# Patient Record
Sex: Male | Born: 1935
Health system: Southern US, Community
[De-identification: ages and names within clinical notes are randomized; demographics above are authoritative.]

## PROBLEM LIST (undated history)

## (undated) DIAGNOSIS — M545 Low back pain, unspecified: Secondary | ICD-10-CM

## (undated) DIAGNOSIS — G4733 Obstructive sleep apnea (adult) (pediatric): Secondary | ICD-10-CM

## (undated) DIAGNOSIS — I639 Cerebral infarction, unspecified: Secondary | ICD-10-CM

## (undated) DIAGNOSIS — E785 Hyperlipidemia, unspecified: Secondary | ICD-10-CM

## (undated) DIAGNOSIS — Z9989 Dependence on other enabling machines and devices: Secondary | ICD-10-CM

## (undated) DIAGNOSIS — H409 Unspecified glaucoma: Secondary | ICD-10-CM

## (undated) DIAGNOSIS — N400 Enlarged prostate without lower urinary tract symptoms: Secondary | ICD-10-CM

## (undated) DIAGNOSIS — N189 Chronic kidney disease, unspecified: Secondary | ICD-10-CM

## (undated) DIAGNOSIS — Z950 Presence of cardiac pacemaker: Secondary | ICD-10-CM

## (undated) DIAGNOSIS — I10 Essential (primary) hypertension: Secondary | ICD-10-CM

## (undated) DIAGNOSIS — M1990 Unspecified osteoarthritis, unspecified site: Secondary | ICD-10-CM

## (undated) DIAGNOSIS — M069 Rheumatoid arthritis, unspecified: Secondary | ICD-10-CM

## (undated) DIAGNOSIS — D519 Vitamin B12 deficiency anemia, unspecified: Secondary | ICD-10-CM

## (undated) DIAGNOSIS — Z7901 Long term (current) use of anticoagulants: Secondary | ICD-10-CM

## (undated) DIAGNOSIS — I499 Cardiac arrhythmia, unspecified: Secondary | ICD-10-CM

## (undated) DIAGNOSIS — I4891 Unspecified atrial fibrillation: Secondary | ICD-10-CM

## (undated) DIAGNOSIS — R7303 Prediabetes: Secondary | ICD-10-CM

## (undated) DIAGNOSIS — Z8719 Personal history of other diseases of the digestive system: Secondary | ICD-10-CM

## (undated) DIAGNOSIS — Z9289 Personal history of other medical treatment: Secondary | ICD-10-CM

## (undated) DIAGNOSIS — C801 Malignant (primary) neoplasm, unspecified: Secondary | ICD-10-CM

## (undated) DIAGNOSIS — G8929 Other chronic pain: Secondary | ICD-10-CM

## (undated) DIAGNOSIS — Z8739 Personal history of other diseases of the musculoskeletal system and connective tissue: Secondary | ICD-10-CM

## (undated) DIAGNOSIS — R7302 Impaired glucose tolerance (oral): Secondary | ICD-10-CM

## (undated) HISTORY — PX: CATARACT EXTRACTION W/ INTRAOCULAR LENS  IMPLANT, BILATERAL: SHX1307

## (undated) HISTORY — DX: Rheumatoid arthritis, unspecified: M06.9

## (undated) HISTORY — DX: Hyperlipidemia, unspecified: E78.5

## (undated) HISTORY — DX: Essential (primary) hypertension: I10

## (undated) HISTORY — DX: Impaired glucose tolerance (oral): R73.02

## (undated) HISTORY — PX: CHOLECYSTECTOMY OPEN: SUR202

## (undated) HISTORY — DX: Benign prostatic hyperplasia without lower urinary tract symptoms: N40.0

## (undated) HISTORY — DX: Unspecified atrial fibrillation: I48.91

## (undated) HISTORY — DX: Cerebral infarction, unspecified: I63.9

## (undated) HISTORY — DX: Long term (current) use of anticoagulants: Z79.01

## (undated) HISTORY — PX: COLONOSCOPY: SHX174

---

## 2001-03-16 ENCOUNTER — Ambulatory Visit (HOSPITAL_BASED_OUTPATIENT_CLINIC_OR_DEPARTMENT_OTHER): Admission: RE | Admit: 2001-03-16 | Discharge: 2001-03-16 | Payer: Self-pay

## 2001-04-01 ENCOUNTER — Ambulatory Visit (HOSPITAL_COMMUNITY): Admission: RE | Admit: 2001-04-01 | Discharge: 2001-04-01 | Payer: Self-pay | Admitting: Gastroenterology

## 2001-04-15 ENCOUNTER — Ambulatory Visit (HOSPITAL_BASED_OUTPATIENT_CLINIC_OR_DEPARTMENT_OTHER): Admission: RE | Admit: 2001-04-15 | Discharge: 2001-04-15 | Payer: Self-pay

## 2005-02-25 HISTORY — PX: US ECHOCARDIOGRAPHY: HXRAD669

## 2005-03-12 ENCOUNTER — Encounter: Admission: RE | Admit: 2005-03-12 | Discharge: 2005-06-10 | Payer: Self-pay | Admitting: Family Medicine

## 2005-04-11 ENCOUNTER — Ambulatory Visit (HOSPITAL_COMMUNITY): Admission: RE | Admit: 2005-04-11 | Discharge: 2005-04-11 | Payer: Self-pay | Admitting: Cardiovascular Disease

## 2007-05-11 ENCOUNTER — Encounter: Admission: RE | Admit: 2007-05-11 | Discharge: 2007-05-11 | Payer: Self-pay | Admitting: Cardiovascular Disease

## 2010-07-26 ENCOUNTER — Ambulatory Visit: Payer: Self-pay | Admitting: Cardiovascular Disease

## 2011-01-17 NOTE — Procedures (Signed)
[place]  Patient:    Mario Fisher, Mario Fisher                        MRN: [id number] Proc. Date: 04/01/01 Attending:  Petra Kuba, M.D. CC:         Jamesetta Geralds, M.D.   Procedure Report  PROCEDURE:  Colonoscopy.  INDICATION:  Change of bowel habits, worsening constipation, some bright red blood per rectum.   Consent was signed after risks, benefits, methods, and options thoroughly discussed in the office.  MEDICATIONS:  Demerol 50 mg, Versed 5 mg.  DESCRIPTION OF PROCEDURE:  Rectal inspection was pertinent for small external hemorrhoids.  Digital exam was negative.  The video colonoscope was inserted, easily advanced around the colon to the cecum.  This did not require any abdominal pressure or any position changes.  The cecum was identified by the appendiceal orifice and the ileocecal valve.  No obvious abnormality was seen on insertion.  The scope was slowly withdrawn.  The prep was adequate.  There was some liquid stool that required washing and suctioning.  On slow withdrawal through the colon, other than an occasional left-sided diverticulum, no other abnormalities were seen.  Once back in the rectum the scope was retroflexed, pertinent for some internal hemorrhoids.  The scope was straightened and readvanced a short way up the sigmoid, air was suctioned, and scope removed.  The patient tolerated the procedure well.  There was no obvious immediate complication.  ENDOSCOPIC DIAGNOSES: 1. Internal-external hemorrhoid. 2. Occasional left-sided diverticula. 3. Otherwise within normal limits to the cecum.  PLAN:  Yearly rectals and guaiacs per Dr. Tery Sanfilippo.  Happy to see back sooner p.r.n. or in two to three months to recheck his symptoms, guaiacs, and make sure no further workup plans are needed. DD:  04/01/01 TD:  04/01/01 Job: [id number]

## 2011-04-07 ENCOUNTER — Encounter: Payer: Self-pay | Admitting: Cardiovascular Disease

## 2011-06-02 ENCOUNTER — Ambulatory Visit: Payer: Self-pay | Admitting: Cardiovascular Disease

## 2011-06-05 ENCOUNTER — Ambulatory Visit (INDEPENDENT_AMBULATORY_CARE_PROVIDER_SITE_OTHER): Payer: Self-pay | Admitting: Cardiovascular Disease

## 2011-06-05 ENCOUNTER — Encounter: Payer: Self-pay | Admitting: Cardiovascular Disease

## 2011-06-05 VITALS — BP 133/81 | HR 70 | Ht 66.0 in | Wt 177.4 lb

## 2011-06-05 DIAGNOSIS — I4891 Unspecified atrial fibrillation: Secondary | ICD-10-CM

## 2011-06-05 DIAGNOSIS — I10 Essential (primary) hypertension: Secondary | ICD-10-CM

## 2011-06-05 NOTE — Progress Notes (Signed)
Mario Fisher Date of Birth  05/08/1934 [facility] [address]    [address] [place], [place]  [address] [phone]  Fax  [phone]  History of Present Illness:  77 year old gentleman with a history of hypertension and hyperlipidemia. He has intermittent atrial fibrillation.   He complains of back pain and knee pain.  Current Outpatient Prescriptions  Medication Sig Dispense Refill  . allopurinol (ZYLOPRIM) 100 MG tablet Take 100 mg by mouth 2 (two) times daily.        Marland Kitchen amLODipine (NORVASC) 2.5 MG tablet Take 2.5 mg by mouth daily.        . Cetirizine HCl (ZYRTEC PO) Take by mouth as needed.        . Cyanocobalamin (VITAMIN B-12 IJ) Inject as directed every 30 (thirty) days.        . dabigatran (PRADAXA) 150 MG CAPS Take 150 mg by mouth 2 (two) times daily.        . fish oil-omega-3 fatty acids 1000 MG capsule Take 1,200 mg by mouth 2 (two) times daily.       . Glucosamine-Chondroit-Vit C-Mn (GLUCOSAMINE CHONDR 1500 COMPLX PO) Take 1,500 mg by mouth.        . Multiple Vitamin (MULTIVITAMIN) tablet Take 1 tablet by mouth daily.        . Red Yeast Rice Extract (RED YEAST RICE PO) Take 600 mg by mouth 3 (three) times daily.       . Tamsulosin HCl (FLOMAX) 0.4 MG CAPS Take 0.4 mg by mouth daily.        . traMADol (ULTRAM) 50 MG tablet Take 50 mg by mouth every 6 (six) hours as needed.        . valsartan-hydrochlorothiazide (DIOVAN-HCT) 80-12.5 MG per tablet Take 1 tablet by mouth daily.        . naproxen sodium (ANAPROX) 220 MG tablet Take 220 mg by mouth as needed.            Allergies  Allergen Reactions  . Hctz (Hydrochlorothiazide)     Past Medical History  Diagnosis Date  . Atrial fibrillation   . Hypertension   . BPH (benign prostatic hypertrophy)   . Hyperlipidemia     Past Surgical History  Procedure Date  . Cholecystectomy   . US echocardiography 02/25/2005    ef 55-60%    History  Smoking status  . Former Smoker  . Quit date: 09/01/1968    Smokeless tobacco  . Not on file    History  Alcohol Use No    Family History  Problem Relation Age of Onset  . Arrhythmia Mother   . Heart attack Father     x2  . Hypertension Father   . Stroke Father   . Heart disease Sister   . Hypertension Brother     Reviw of Systems:  Reviewed in the HPI.  All other systems are negative.  Physical Exam: BP 133/81  Pulse 70  Ht 5\' 6"  (1.676 m)  Wt 177 lb 6.4 oz (80.468 kg)  BMI 28.63 kg/m2 The patient is alert and oriented x 3.  The mood and affect are normal.   Skin: warm and dry.  Color is normal.    HEENT:   the sclera are nonicteric.  The mucous membranes are moist.  The carotids are 2+ without bruits.  There is no thyromegaly.  There is no JVD.    Lungs: clear.  The chest wall is non tender.    Heart:  regular rate with a normal S1 and S2.  There are no murmurs, gallops, or rubs. The PMI is not displaced.     Abdomen: good bowel sounds.  There is no guarding or rebound.  There is no hepatosplenomegaly or tenderness.  There are no masses.   Extremities:  no clubbing, cyanosis, or edema.  The legs are without rashes.  The distal pulses are intact.   Neuro:  Cranial nerves II - XII are intact.  Motor and sensory functions are intact.    The gait is normal.  ECG: NSR with 1st degree block.    Assessment / Plan:

## 2011-06-05 NOTE — Assessment & Plan Note (Addendum)
He remains in normal sinus rhythm.  He is tolerating his Pradaxa  without any difficulties.

## 2011-06-05 NOTE — Assessment & Plan Note (Signed)
His blood pressure seems to be doing fairly well. He currently was on Diovan 160 mg but developed renal insufficiency. He's now on Diovan HCT 80 mg/12.5 mg a day.  He has Diovan 160 mg at home. We'll give him a separate prescription for HCTZ and he is to take 12.5 mg a day to use up the larger Diovan tablets. After that prescription is finished, or he'll continue with the combination Diovan HCT. We will have him followup with Dr. Clarene Duke for further management of his hypertension.

## 2011-06-05 NOTE — Patient Instructions (Addendum)
Your physician wants you to follow-up in: 6 months, You will receive a reminder letter in the mail two months in advance. If you don't receive a letter, please call our office to schedule the follow-up appointment.  Your physician recommends that you continue on your current medications as directed. Please refer to the Current Medication list given to you today.   

## 2011-06-06 ENCOUNTER — Other Ambulatory Visit: Payer: Self-pay | Admitting: *Deleted

## 2011-06-06 MED ORDER — DABIGATRAN ETEXILATE MESYLATE 150 MG PO CAPS: 150.0000 mg | ORAL_CAPSULE | Freq: Two times a day (BID) | ORAL | Status: DC

## 2011-06-06 NOTE — Telephone Encounter (Signed)
Fax Received. Refill Completed. Mario Fisher (M.A)  

## 2011-07-30 ENCOUNTER — Encounter: Payer: Self-pay | Admitting: Cardiovascular Disease

## 2011-08-18 ENCOUNTER — Telehealth: Payer: Self-pay | Admitting: Cardiovascular Disease

## 2011-08-18 NOTE — Telephone Encounter (Signed)
Pt doent need to stop med, pt made aware.

## 2011-08-18 NOTE — Telephone Encounter (Signed)
New message:  Pt to have dental work (cleaning) does he need to d/c Pradaxa?  Please call and leave message if no answer.

## 2011-12-03 ENCOUNTER — Ambulatory Visit: Payer: Medicare Other | Admitting: Cardiovascular Disease

## 2011-12-12 ENCOUNTER — Other Ambulatory Visit: Payer: Self-pay | Admitting: Cardiovascular Disease

## 2011-12-12 MED ORDER — DABIGATRAN ETEXILATE MESYLATE 150 MG PO CAPS: 150.0000 mg | ORAL_CAPSULE | Freq: Two times a day (BID) | ORAL | Status: DC

## 2011-12-19 ENCOUNTER — Ambulatory Visit (INDEPENDENT_AMBULATORY_CARE_PROVIDER_SITE_OTHER): Payer: Medicare Other | Admitting: Cardiovascular Disease

## 2011-12-19 ENCOUNTER — Encounter: Payer: Self-pay | Admitting: Cardiovascular Disease

## 2011-12-19 VITALS — BP 111/67 | HR 87 | Resp 18 | Ht 66.0 in | Wt 190.8 lb

## 2011-12-19 DIAGNOSIS — I10 Essential (primary) hypertension: Secondary | ICD-10-CM

## 2011-12-19 DIAGNOSIS — I4891 Unspecified atrial fibrillation: Secondary | ICD-10-CM

## 2011-12-19 NOTE — Assessment & Plan Note (Signed)
His blood pressure is quite normal today. It may be possible for him to be on Diovan HCT 320 mg/12.5 mg. This may allow him to stop the amlodipine completely.  Another alternative would be to change him to losartan 100 mg a day. He apparently has had some problems with renal insufficiency with the high dose HCTZ.   I'll see him back in 6 months.

## 2011-12-19 NOTE — Patient Instructions (Addendum)
Look up Xarelto 20 MG DAILY instead of Pradaxa. Your insurance company may like that one better.  Your physician wants you to follow-up in: 6 MONTHS  You will receive a reminder letter in the mail two months in advance. If you don't receive a letter, please call our office to schedule the follow-up appointment.  Your physician recommends that you return for a FASTING lipid profile: 6 MONTHS

## 2011-12-19 NOTE — Progress Notes (Signed)
Mario Fisher Date of Birth  07-Apr-1934 [facility] [address]    [address] [place], [place]  [address] [phone]  Fax  [phone]  Problem list: 1. Hypertension 2. Hyperlipidemia 3. Paroxysmal atrial fibrillation 4. Gout History of Present Illness:  78 year old gentleman with a history of hypertension and hyperlipidemia. He has intermittent atrial fibrillation.   He complains of back pain and knee pain.  Current Outpatient Prescriptions  Medication Sig Dispense Refill  . allopurinol (ZYLOPRIM) 100 MG tablet Take 100 mg by mouth 2 (two) times daily.        Marland Kitchen amLODipine (NORVASC) 2.5 MG tablet Take 2.5 mg by mouth daily.        . Cyanocobalamin (VITAMIN B-12 IJ) Inject as directed every 30 (thirty) days.        . dabigatran (PRADAXA) 150 MG CAPS Take 1 capsule (150 mg total) by mouth 2 (two) times daily.  60 capsule  5  . fish oil-omega-3 fatty acids 1000 MG capsule Take 1,200 mg by mouth 2 (two) times daily.       . Glucosamine-Chondroit-Vit C-Mn (GLUCOSAMINE CHONDR 1500 COMPLX PO) Take 1,500 mg by mouth.        . loratadine (CLARITIN) 10 MG tablet Take 10 mg by mouth daily.      . Multiple Vitamin (MULTIVITAMIN) tablet Take 1 tablet by mouth daily.        . naproxen sodium (ANAPROX) 220 MG tablet Take 220 mg by mouth as needed.        . Red Yeast Rice Extract (RED YEAST RICE PO) Take 600 mg by mouth 3 (three) times daily.       . Tamsulosin HCl (FLOMAX) 0.4 MG CAPS Take 0.4 mg by mouth daily.        . valsartan-hydrochlorothiazide (DIOVAN-HCT) 80-12.5 MG per tablet Take 1 tablet by mouth daily.            Allergies  Allergen Reactions  . Hctz (Hydrochlorothiazide)     Past Medical History  Diagnosis Date  . Atrial fibrillation   . Hypertension   . BPH (benign prostatic hypertrophy)   . Hyperlipidemia     Past Surgical History  Procedure Date  . Cholecystectomy   . US echocardiography 02/25/2005    ef 55-60%    History  Smoking status  . Former  Smoker  . Quit date: 09/01/1968  Smokeless tobacco  . Not on file    History  Alcohol Use No    Family History  Problem Relation Age of Onset  . Arrhythmia Mother   . Heart attack Father     x2  . Hypertension Father   . Stroke Father   . Heart disease Sister   . Hypertension Brother     Reviw of Systems:  Reviewed in the HPI.  All other systems are negative.  Physical Exam: BP 111/67  Pulse 87  Resp 18  Ht 5\' 6"  (1.676 m)  Wt 190 lb 12.8 oz (86.546 kg)  BMI 30.80 kg/m2 The patient is alert and oriented x 3.  The mood and affect are normal.   Skin: warm and dry.  Color is normal.    HEENT:   the sclera are nonicteric.  The mucous membranes are moist.  The carotids are 2+ without bruits.  There is no thyromegaly.  There is no JVD.    Lungs: clear.  The chest wall is non tender.    Heart: regular rate with a normal S1 and S2.  There are no murmurs, gallops, or rubs. The PMI is not displaced.     Abdomen: good bowel sounds.  There is no guarding or rebound.  There is no hepatosplenomegaly or tenderness.  There are no masses.   Extremities:  no clubbing, cyanosis, or edema.  The legs are without rashes.  The distal pulses are intact.   Neuro:  Cranial nerves II - XII are intact.  Motor and sensory functions are intact.    The gait is normal.  ECG:  Assessment / Plan:

## 2011-12-19 NOTE — Assessment & Plan Note (Signed)
He has maintained NSR.  He has had 2 episodes of heart beat irregularities.  Continue pradaxa.  Consider Xarelto if its cheaper.

## 2012-02-18 ENCOUNTER — Telehealth: Payer: Self-pay | Admitting: Cardiovascular Disease

## 2012-02-18 MED ORDER — METOPROLOL TARTRATE 25 MG PO TABS: ORAL_TABLET | ORAL | Status: DC

## 2012-02-18 NOTE — Telephone Encounter (Signed)
bp 101/69 p 84, c/o intermittent heart flutters 7 x in 3 hrs yesterday, 3-4 x today, denies sob/ cp. Lori gerhardt Np reviewed and started lopressor 25 mg 1/2 tab bid, and f/u in 1 week, app made, pt agreed to plan.

## 2012-02-18 NOTE — Telephone Encounter (Signed)
Pt has been having fluttering for about a week and can even see his shirt move at times he is a little weaker than normal but other than that no other symptoms and they would like a call to know is there something they need to be doing.

## 2012-02-23 ENCOUNTER — Telehealth: Payer: Self-pay | Admitting: Nurse Practitioner

## 2012-02-23 NOTE — Telephone Encounter (Signed)
Pt's wife calls today about whether pt should take pm dose of metoprolol. According to wife: pt had 2 episodes of "fluttering" on Thursday and Friday of last week.  On Saturday & Sunday pt was feeling weak (and still does so). Heart rate was in the 40's ( 46-48)  Today heart rate in the 50's with a BP of 105/66. Dr. Elease Hashimoto reviewed. Recommended pt stop Metoprolol and follow-up as soon as possible ( may have SSS & need a pacemaker) Wife is aware. Pt has scheduled appt for tomorrow ( 02/24/12) with Lawson Fiscal NP Mylo Red RN

## 2012-02-23 NOTE — Telephone Encounter (Signed)
Patient wife would like a return call from Dawayne Patricia. At [phone] regarding patient Heart Rate, Blood pressure, and Medication.

## 2012-02-24 ENCOUNTER — Encounter: Payer: Self-pay | Admitting: Nurse Practitioner

## 2012-02-24 ENCOUNTER — Ambulatory Visit (INDEPENDENT_AMBULATORY_CARE_PROVIDER_SITE_OTHER): Payer: Medicare Other | Admitting: Nurse Practitioner

## 2012-02-24 VITALS — BP 115/64 | HR 72 | Ht 66.0 in | Wt 191.6 lb

## 2012-02-24 DIAGNOSIS — I4891 Unspecified atrial fibrillation: Secondary | ICD-10-CM

## 2012-02-24 DIAGNOSIS — R5381 Other malaise: Secondary | ICD-10-CM

## 2012-02-24 DIAGNOSIS — I10 Essential (primary) hypertension: Secondary | ICD-10-CM

## 2012-02-24 DIAGNOSIS — R002 Palpitations: Secondary | ICD-10-CM

## 2012-02-24 DIAGNOSIS — I480 Paroxysmal atrial fibrillation: Secondary | ICD-10-CM

## 2012-02-24 DIAGNOSIS — R5383 Other fatigue: Secondary | ICD-10-CM

## 2012-02-24 LAB — CBC WITH DIFFERENTIAL/PLATELET
Basophils Absolute: 0 10*3/uL (ref 0.0–0.1)
Basophils Relative: 0.6 % (ref 0.0–3.0)
Eosinophils Absolute: 0.2 10*3/uL (ref 0.0–0.7)
Eosinophils Relative: 3.2 % (ref 0.0–5.0)
HCT: 43.2 % (ref 39.0–52.0)
Hemoglobin: 14.2 g/dL (ref 13.0–17.0)
Lymphocytes Relative: 30.8 % (ref 12.0–46.0)
Lymphs Abs: 1.8 10*3/uL (ref 0.7–4.0)
MCHC: 33 g/dL (ref 30.0–36.0)
MCV: 95.8 fl (ref 78.0–100.0)
Monocytes Absolute: 0.7 10*3/uL (ref 0.1–1.0)
Monocytes Relative: 11.4 % (ref 3.0–12.0)
Neutro Abs: 3.1 10*3/uL (ref 1.4–7.7)
Neutrophils Relative %: 54 % (ref 43.0–77.0)
Platelets: 238 10*3/uL (ref 150.0–400.0)
RBC: 4.5 Mil/uL (ref 4.22–5.81)
RDW: 15.3 % — ABNORMAL HIGH (ref 11.5–14.6)
WBC: 5.7 10*3/uL (ref 4.5–10.5)

## 2012-02-24 LAB — TSH: TSH: 1.04 u[IU]/mL (ref 0.35–5.50)

## 2012-02-24 LAB — BASIC METABOLIC PANEL
BUN: 20 mg/dL (ref 6–23)
CO2: 24 mEq/L (ref 19–32)
Calcium: 9.5 mg/dL (ref 8.4–10.5)
Chloride: 105 mEq/L (ref 96–112)
Creatinine, Ser: 1.4 mg/dL (ref 0.4–1.5)
GFR: 53.66 mL/min — ABNORMAL LOW (ref >60.00)
Glucose, Bld: 130 mg/dL — ABNORMAL HIGH (ref 70–99)
Potassium: 3.8 mEq/L (ref 3.5–5.1)
Sodium: 137 mEq/L (ref 135–145)

## 2012-02-24 NOTE — Patient Instructions (Addendum)
Stop the Norvasc for now - this should help your blood pressure come up  We are going to put a heart monitor on for the next month to watch your rhythm  We are going to check labs today  I will have you see Dr. Elease Hashimoto in about 5 weeks for discussion  We will see if we have samples of Pradaxa. Talk to the pharmacist about the cost of Xarelto 20mg   Call the [facility] office at [phone] if you have any questions, problems or concerns.

## 2012-02-24 NOTE — Assessment & Plan Note (Signed)
He presents with complaint of heart fluttering and fatigue. He thinks this is different from his atrial fib. He is on Pradaxa. He had a nice response with the low dose beta blocker but it resulted in significant bradycardia. We will check some baseline labs today. Will place an event monitor. Will see him back for discussion in 5 weeks. Patient is agreeable to this plan and will call if any problems develop in the interim.

## 2012-02-24 NOTE — Assessment & Plan Note (Signed)
Blood pressure is actually running on the low side. This may explain some of his fatigue. Will stop his Norvasc. He will continue to monitor at home.

## 2012-02-24 NOTE — Addendum Note (Signed)
Addended by: Vista Mink D on: 02/24/2012 11:40 AM   Modules accepted: Orders

## 2012-02-24 NOTE — Progress Notes (Signed)
Mario Fisher Date of Birth: 1933/11/05 Medical Record #[id number]  History of Present Illness: Mario Fisher is seen today for a work in visit. He is seen for Mario Fisher. He has a history of PAF, on chronic Pradaxa. Other issues include HTN, HLD, remote stroke and probable diabetes.   He comes in today. He is here with his wife. His wife has called here twice over the last week or so. She first reported that he was having "heart fluttering" 3 to 4 x per day. Not symptomatic. He could see his shirt move and they both got a little worried. We tried very low dose beta blocker, but this caused his heart rate to drop into the 40's. It did help with the flutters. The metoprolol was stopped yesterday. He has had no chest pain. Not lightheaded or dizzy. No chest pain. He is fatigued. Does use excessive caffeine. He remains on Pradaxa. Blood pressure diary from home shows systolic readings down in the 78'G with the highest in the 120's.   Current Outpatient Prescriptions on File Prior to Visit  Medication Sig Dispense Refill  . allopurinol (ZYLOPRIM) 100 MG tablet Take 100 mg by mouth 2 (two) times daily.        Marland Kitchen amLODipine (NORVASC) 2.5 MG tablet Take 2.5 mg by mouth daily.        . Cyanocobalamin (VITAMIN B-12 IJ) Inject as directed every 30 (thirty) days.        . dabigatran (PRADAXA) 150 MG CAPS Take 1 capsule (150 mg total) by mouth 2 (two) times daily.  60 capsule  5  . fish oil-omega-3 fatty acids 1000 MG capsule Take 1,200 mg by mouth 2 (two) times daily.       . Glucosamine-Chondroit-Vit C-Mn (GLUCOSAMINE CHONDR 1500 COMPLX PO) Take 1,500 mg by mouth.        . loratadine (CLARITIN) 10 MG tablet Take 10 mg by mouth daily.      . Multiple Vitamin (MULTIVITAMIN) tablet Take 1 tablet by mouth daily.        . Red Yeast Rice Extract (RED YEAST RICE PO) Take 600 mg by mouth 3 (three) times daily.       . Tamsulosin HCl (FLOMAX) 0.4 MG CAPS Take 0.4 mg by mouth daily.        .  valsartan-hydrochlorothiazide (DIOVAN-HCT) 80-12.5 MG per tablet Take 1 tablet by mouth daily.        . metoprolol tartrate (LOPRESSOR) 25 MG tablet One half tablet (12.5mg ) by mouth twice daily- 12 hrs apart  30 tablet  5  . naproxen sodium (ANAPROX) 220 MG tablet Take 220 mg by mouth as needed.          No Active Allergies  Past Medical History  Diagnosis Date  . Atrial fibrillation   . Hypertension   . BPH (benign prostatic hypertrophy)   . Hyperlipidemia     Past Surgical History  Procedure Date  . Cholecystectomy   . US echocardiography 02/25/2005    ef 55-60%    History  Smoking status  . Former Smoker  . Quit date: 09/01/1968  Smokeless tobacco  . Never Used    History  Alcohol Use No    Family History  Problem Relation Age of Onset  . Arrhythmia Mother   . Heart attack Father     x2  . Hypertension Father   . Stroke Father   . Heart disease Sister   . Hypertension Brother  Review of Systems: The review of systems is per the HPI.  All other systems were reviewed and are negative.  Physical Exam: BP 115/64  Pulse 72  Ht 5\' 6"  (1.676 m)  Wt 191 lb 9.6 oz (86.909 kg)  BMI 30.92 kg/m2  SpO2 98% Patient is very pleasant and in no acute distress. Skin is warm and dry. Color is normal.  HEENT is unremarkable. Normocephalic/atraumatic. PERRL. Sclera are nonicteric. Neck is supple. No masses. No JVD. Lungs are clear. Cardiac exam shows a regular rate and rhythm. Abdomen is soft. Extremities are without edema. Gait and ROM are intact. No gross neurologic deficits noted.  LABORATORY DATA: EKG today shows sinus with a first degree AV block. Tracing is unchanged.    Assessment / Plan:

## 2012-02-25 ENCOUNTER — Telehealth: Payer: Self-pay | Admitting: Cardiovascular Disease

## 2012-02-25 NOTE — Telephone Encounter (Signed)
New problem:  Patient call back to speak with nurse.

## 2012-02-25 NOTE — Telephone Encounter (Signed)
Pt is returning Amanda's call. Pt states had a monitor placed yesterday.

## 2012-03-01 ENCOUNTER — Telehealth: Payer: Self-pay | Admitting: Cardiovascular Disease

## 2012-03-01 NOTE — Telephone Encounter (Signed)
I called [employer] and they said his monitor went off due to poor conduction, pt informed.

## 2012-03-01 NOTE — Telephone Encounter (Signed)
The monitor that the pt is wearing has alarmed int he last two days and wanted to make sure everything is ok.

## 2012-03-31 ENCOUNTER — Other Ambulatory Visit: Payer: Self-pay | Admitting: Nurse Practitioner

## 2012-03-31 DIAGNOSIS — Z8679 Personal history of other diseases of the circulatory system: Secondary | ICD-10-CM

## 2012-04-01 ENCOUNTER — Ambulatory Visit (INDEPENDENT_AMBULATORY_CARE_PROVIDER_SITE_OTHER): Payer: Medicare Other | Admitting: Cardiovascular Disease

## 2012-04-01 ENCOUNTER — Encounter: Payer: Self-pay | Admitting: Cardiovascular Disease

## 2012-04-01 VITALS — BP 117/74 | HR 74 | Ht 66.0 in | Wt 187.1 lb

## 2012-04-01 DIAGNOSIS — I4891 Unspecified atrial fibrillation: Secondary | ICD-10-CM

## 2012-04-01 NOTE — Progress Notes (Signed)
Mario Fisher Date of Birth  04-24-1934 [facility] [address]    [address] [place], [place]  [address] [phone]  Fax  [phone]  Problem list: 1. Hypertension 2. Hyperlipidemia 3. Paroxysmal atrial fibrillation 4. Gout  History of Present Illness:  78 year old gentleman with a history of hypertension and hyperlipidemia. He has intermittent atrial fibrillation.   He complains of back pain and knee pain. He has been found to have sick sinus syndrome on event monitor.    Current Outpatient Prescriptions  Medication Sig Dispense Refill  . allopurinol (ZYLOPRIM) 100 MG tablet Take 100 mg by mouth 2 (two) times daily.        . Cyanocobalamin (VITAMIN B-12 IJ) Inject as directed every 30 (thirty) days.        . dabigatran (PRADAXA) 150 MG CAPS Take 1 capsule (150 mg total) by mouth 2 (two) times daily.  60 capsule  5  . fish oil-omega-3 fatty acids 1000 MG capsule Take 1,200 mg by mouth 2 (two) times daily.       . Glucosamine-Chondroit-Vit C-Mn (GLUCOSAMINE CHONDR 1500 COMPLX PO) Take 1,500 mg by mouth.        . loratadine (CLARITIN) 10 MG tablet Take 10 mg by mouth daily.      . Multiple Vitamin (MULTIVITAMIN) tablet Take 1 tablet by mouth daily.        . naproxen sodium (ANAPROX) 220 MG tablet Take 220 mg by mouth as needed.        . Red Yeast Rice Extract (RED YEAST RICE PO) Take 600 mg by mouth 3 (three) times daily.       . Tamsulosin HCl (FLOMAX) 0.4 MG CAPS Take 0.4 mg by mouth daily.        . valsartan-hydrochlorothiazide (DIOVAN-HCT) 80-12.5 MG per tablet Take 1 tablet by mouth daily.            No Known Allergies  Past Medical History  Diagnosis Date  . Atrial fibrillation   . Hypertension   . BPH (benign prostatic hypertrophy)   . Hyperlipidemia   . Stroke     remote CVA noted on MRI  . Glucose intolerance (impaired glucose tolerance)   . Chronic anticoagulation     on Pradaxa    Past Surgical History  Procedure Date  . Cholecystectomy     . US echocardiography 02/25/2005    ef 55-60%    History  Smoking status  . Former Smoker  . Quit date: 09/01/1968  Smokeless tobacco  . Never Used    History  Alcohol Use No    Family History  Problem Relation Age of Onset  . Arrhythmia Mother   . Heart attack Father     x2  . Hypertension Father   . Stroke Father   . Heart disease Sister   . Hypertension Brother     Reviw of Systems:  Reviewed in the HPI.  All other systems are negative.  Physical Exam: BP 117/74  Pulse 74  Ht 5\' 6"  (1.676 m)  Wt 187 lb 1.9 oz (84.877 kg)  BMI 30.20 kg/m2 The patient is alert and oriented x 3.  The mood and affect are normal.   Skin: warm and dry.  Color is normal.    HEENT:   the sclera are nonicteric.  The mucous membranes are moist.  The carotids are 2+ without bruits.  There is no thyromegaly.  There is no JVD.    Lungs: clear.  The chest wall  is non tender.    Heart: regular rate with a normal S1 and S2.  There are no murmurs, gallops, or rubs. The PMI is not displaced.     Abdomen: good bowel sounds.  There is no guarding or rebound.  There is no hepatosplenomegaly or tenderness.  There are no masses.   Extremities:  no clubbing, cyanosis, or edema.  The legs are without rashes.  The distal pulses are intact.   Neuro:  Cranial nerves II - XII are intact.  Motor and sensory functions are intact.    The gait is normal.  ECG:  Assessment / Plan:

## 2012-04-01 NOTE — Assessment & Plan Note (Signed)
Patient has paroxysmal atrial fibrillation. He had an event monitor recently that showed significant bradycardia and 8 show some 2-1 heart block. Occasionally has heart rates as low as 30. He is in normal sinus rhythm on exam today a rate of 74. I suspect that he has sick sinus syndrome. I suspect that he'll need a pacemaker some point. We will arrange for him to see the electrophysiology team.  His blood pressure heart rate are normal today and so we will not make any changes. He's not on any medicines that we'll lower his heart rate. He was previously on metoprolol but we stopped that several visits ago.

## 2012-04-01 NOTE — Patient Instructions (Addendum)
Your physician recommends that you schedule a follow-up appointment in 3 MONTHS. 

## 2012-04-26 ENCOUNTER — Ambulatory Visit (INDEPENDENT_AMBULATORY_CARE_PROVIDER_SITE_OTHER): Payer: Medicare Other | Admitting: Internal Medicine

## 2012-04-26 ENCOUNTER — Encounter: Payer: Self-pay | Admitting: Internal Medicine

## 2012-04-26 VITALS — BP 120/74 | HR 68 | Ht 66.0 in | Wt 189.4 lb

## 2012-04-26 DIAGNOSIS — I4891 Unspecified atrial fibrillation: Secondary | ICD-10-CM

## 2012-04-26 DIAGNOSIS — I10 Essential (primary) hypertension: Secondary | ICD-10-CM

## 2012-04-26 NOTE — Assessment & Plan Note (Signed)
The patient is minimally symptomatic though he clearly has tachycardia with heart rates as high as 160. Because of his lack of symptoms, I have not recommended antiarrhythmic drug therapy at this time. He will likely require a PPM in the future but I am not impressed yet by his symptoms to recommend one.

## 2012-04-26 NOTE — Patient Instructions (Signed)
Your physician wants you to follow-up in: 6 months with Dr Taylor You will receive a reminder letter in the mail two months in advance. If you don't receive a letter, please call our office to schedule the follow-up appointment.  

## 2012-04-26 NOTE — Progress Notes (Signed)
HPI [name] is referred today by Dr. Elease Hashimoto her for evaluation of tachybradycardia syndrome. The patient is a very pleasant 78yo man with hypertension and occasional palpitations. He has a diagnosis of sleep apnea and is on CPAP. He also is a history of vitamin B12 deficiency. The patient's health is been good and he has never had frank syncope. He has occasional palpitations which appear to coincide with atrial fibrillation. He really denies dizziness. He is unlimited in his physical activity. He was initially on metoprolol but developed bradycardia and this drug was discontinued. No Known Allergies   Current Outpatient Prescriptions  Medication Sig Dispense Refill  . allopurinol (ZYLOPRIM) 100 MG tablet Take 100 mg by mouth 2 (two) times daily.        Marland Kitchen amLODipine (NORVASC) 2.5 MG tablet Take 2.5 mg by mouth daily.      . Cyanocobalamin (VITAMIN B-12 IJ) Inject as directed every 30 (thirty) days.        . dabigatran (PRADAXA) 150 MG CAPS Take 1 capsule (150 mg total) by mouth 2 (two) times daily.  60 capsule  5  . Glucosamine-Chondroit-Vit C-Mn (GLUCOSAMINE CHONDR 1500 COMPLX PO) Take 1,500 mg by mouth.        . latanoprost (XALATAN) 0.005 % ophthalmic solution 1 drop at bedtime.      Marland Kitchen loratadine (CLARITIN) 10 MG tablet Take 10 mg by mouth daily.      . Multiple Vitamin (MULTIVITAMIN) tablet Take 1 tablet by mouth daily.        . naproxen sodium (ANAPROX) 220 MG tablet Take 220 mg by mouth as needed.        . Omega-3 Fatty Acids (FISH OIL) 1200 MG CAPS Take 1,200 mg by mouth 2 (two) times daily. Take 2 capsules in AM and 1 capsule in PM      . Red Yeast Rice Extract (RED YEAST RICE PO) Take 600 mg by mouth 3 (three) times daily.       . Tamsulosin HCl (FLOMAX) 0.4 MG CAPS Take 0.4 mg by mouth daily.        . valsartan-hydrochlorothiazide (DIOVAN-HCT) 80-12.5 MG per tablet Take 1 tablet by mouth daily.           Past Medical History  Diagnosis Date  . Atrial fibrillation   .  Hypertension   . BPH (benign prostatic hypertrophy)   . Hyperlipidemia   . Stroke     remote CVA noted on MRI  . Glucose intolerance (impaired glucose tolerance)   . Chronic anticoagulation     on Pradaxa    ROS:   All systems reviewed and negative except as noted in the HPI.   Past Surgical History  Procedure Date  . Cholecystectomy   . US echocardiography 02/25/2005    ef 55-60%     Family History  Problem Relation Age of Onset  . Arrhythmia Mother   . Heart attack Father     x2  . Hypertension Father   . Stroke Father   . Heart disease Sister   . Hypertension Brother      History   Social History  . Marital Status: Married    Spouse Name: N/A    Number of Children: N/A  . Years of Education: N/A   Occupational History  . Not on file.   Social History Main Topics  . Smoking status: Former Smoker    Quit date: 09/01/1968  . Smokeless tobacco: Never Used  . Alcohol Use: No  .  Drug Use: No  . Sexually Active: Not Currently   Other Topics Concern  . Not on file   Social History Narrative  . No narrative on file     BP 120/74  Pulse 68  Ht 5\' 6"  (1.676 m)  Wt 189 lb 6.4 oz (85.911 kg)  BMI 30.57 kg/m2  Physical Exam:  Well appearing 78 year old man, NAD HEENT: Unremarkable Neck:  No JVD, no thyromegally Lungs:  Clear with no wheezes, rales, or rhonchi. HEART:  Regular rate rhythm, no murmurs, no rubs, no clicks Abd:  soft, obese, positive bowel sounds, no organomegally, no rebound, no guarding Ext:  2 plus pulses, no edema, no cyanosis, no clubbing Skin:  No rashes no nodules Neuro:  CN II through XII intact, motor grossly intact  EKG normal sinus rhythm and sinus bradycardia  Assess/Plan:

## 2012-04-26 NOTE — Assessment & Plan Note (Signed)
His blood pressure is well controlled. He will continue his current meds and maintain a low sodium diet. 

## 2012-07-06 ENCOUNTER — Ambulatory Visit (INDEPENDENT_AMBULATORY_CARE_PROVIDER_SITE_OTHER): Payer: Medicare Other | Admitting: Cardiovascular Disease

## 2012-07-06 ENCOUNTER — Ambulatory Visit: Payer: Medicare Other | Admitting: Cardiovascular Disease

## 2012-07-06 ENCOUNTER — Encounter: Payer: Self-pay | Admitting: Cardiovascular Disease

## 2012-07-06 VITALS — BP 147/83 | HR 67 | Ht 66.0 in | Wt 194.0 lb

## 2012-07-06 DIAGNOSIS — I10 Essential (primary) hypertension: Secondary | ICD-10-CM

## 2012-07-06 DIAGNOSIS — I4891 Unspecified atrial fibrillation: Secondary | ICD-10-CM

## 2012-07-06 NOTE — Assessment & Plan Note (Signed)
Continue current meds Will see him in 6 months.

## 2012-07-06 NOTE — Assessment & Plan Note (Signed)
His Afib is stable. Continue current meds. Will look for Pradaxa samples.

## 2012-07-06 NOTE — Patient Instructions (Addendum)
Your physician wants you to follow-up in: 6 MONTHS.  You will receive a reminder letter in the mail two months in advance. If you don't receive a letter, please call our office to schedule the follow-up appointment.  Your physician recommends that you continue on your current medications as directed. Please refer to the Current Medication list given to you today.  

## 2012-07-06 NOTE — Progress Notes (Signed)
Mario Fisher Date of Birth  1933/09/28 [facility] [address]    [address] [place], [place]  [address] [phone]  Fax  [phone]  Problem list: 1. Hypertension 2. Hyperlipidemia 3. Paroxysmal atrial fibrillation 4. Gout  History of Present Illness:  78 year old gentleman with a history of hypertension and hyperlipidemia. He has intermittent atrial fibrillation.   He complains of back pain and knee pain. He has been found to have sick sinus syndrome on event monitor.    Current Outpatient Prescriptions  Medication Sig Dispense Refill  . Acetaminophen (TYLENOL PO) Take by mouth as needed.      Marland Kitchen allopurinol (ZYLOPRIM) 100 MG tablet Take 100 mg by mouth 2 (two) times daily.        . cetirizine (ZYRTEC) 10 MG tablet Take 10 mg by mouth daily.      . Cyanocobalamin (VITAMIN B-12 IJ) Inject as directed every 30 (thirty) days.        . dabigatran (PRADAXA) 150 MG CAPS Take 1 capsule (150 mg total) by mouth 2 (two) times daily.  60 capsule  5  . Glucosamine-Chondroit-Vit C-Mn (GLUCOSAMINE CHONDR 1500 COMPLX PO) Take 1,500 mg by mouth.        . Multiple Vitamin (MULTIVITAMIN) tablet Take 1 tablet by mouth daily.        . naproxen sodium (ANAPROX) 220 MG tablet Take 220 mg by mouth as needed.        . Omega-3 Fatty Acids (FISH OIL) 1200 MG CAPS Take 1,200 mg by mouth 2 (two) times daily. Take 2 capsules in AM and 1 capsule in PM      . Red Yeast Rice Extract (RED YEAST RICE PO) Take 600 mg by mouth 3 (three) times daily.       . Tamsulosin HCl (FLOMAX) 0.4 MG CAPS Take 0.4 mg by mouth daily.        . valsartan-hydrochlorothiazide (DIOVAN-HCT) 80-12.5 MG per tablet Take 1 tablet by mouth daily.            No Known Allergies  Past Medical History  Diagnosis Date  . Atrial fibrillation   . Hypertension   . BPH (benign prostatic hypertrophy)   . Hyperlipidemia   . Stroke     remote CVA noted on MRI  . Glucose intolerance (impaired glucose tolerance)   . Chronic  anticoagulation     on Pradaxa    Past Surgical History  Procedure Date  . Cholecystectomy   . US echocardiography 02/25/2005    ef 55-60%    History  Smoking status  . Former Smoker  . Quit date: 09/01/1968  Smokeless tobacco  . Never Used    History  Alcohol Use No    Family History  Problem Relation Age of Onset  . Arrhythmia Mother   . Heart attack Father     x2  . Hypertension Father   . Stroke Father   . Heart disease Sister   . Hypertension Brother     Reviw of Systems:  Reviewed in the HPI.  All other systems are negative.  Physical Exam: BP 147/83  Pulse 67  Ht 5\' 6"  (1.676 m)  Wt 194 lb (87.998 kg)  BMI 31.31 kg/m2 The patient is alert and oriented x 3.  The mood and affect are normal.   Skin: warm and dry.  Color is normal.    HEENT:   the sclera are nonicteric.  The mucous membranes are moist.  The carotids are 2+  without bruits.  There is no thyromegaly.  There is no JVD.    Lungs: clear.  The chest wall is non tender.    Heart: regular rate with a normal S1 and S2.  There are no murmurs, gallops, or rubs. The PMI is not displaced.     Abdomen: good bowel sounds.  There is no guarding or rebound.  There is no hepatosplenomegaly or tenderness.  There are no masses.   Extremities:  no clubbing, cyanosis, or edema.  The legs are without rashes.  The distal pulses are intact.   Neuro:  Cranial nerves II - XII are intact.  Motor and sensory functions are intact.    The gait is normal.  ECG:  Assessment / Plan:

## 2012-08-09 ENCOUNTER — Other Ambulatory Visit: Payer: Self-pay | Admitting: *Deleted

## 2012-08-09 MED ORDER — DABIGATRAN ETEXILATE MESYLATE 150 MG PO CAPS: 150.0000 mg | ORAL_CAPSULE | Freq: Two times a day (BID) | ORAL | Status: DC

## 2012-08-09 NOTE — Telephone Encounter (Signed)
Fax Received. Refill Completed. Cielo Arias Chowoe (R.M.A)   

## 2012-10-06 ENCOUNTER — Encounter: Payer: Self-pay | Admitting: Internal Medicine

## 2012-10-06 ENCOUNTER — Ambulatory Visit (INDEPENDENT_AMBULATORY_CARE_PROVIDER_SITE_OTHER): Payer: Medicare Other | Admitting: Internal Medicine

## 2012-10-06 VITALS — BP 131/69 | HR 80 | Ht 65.0 in | Wt 193.8 lb

## 2012-10-06 DIAGNOSIS — I10 Essential (primary) hypertension: Secondary | ICD-10-CM

## 2012-10-06 DIAGNOSIS — I4891 Unspecified atrial fibrillation: Secondary | ICD-10-CM

## 2012-10-06 NOTE — Assessment & Plan Note (Signed)
The patient appears to be maintaining sinus rhythm very nicely. No change in medical therapy today.

## 2012-10-06 NOTE — Assessment & Plan Note (Signed)
Review of his blood pressure log today demonstrates that his pressures been recently well controlled except for a few low blood pressure readings. He will continue to reduce his salt intake and continue his current medical therapy.

## 2012-10-06 NOTE — Patient Instructions (Addendum)
Your physician wants you to follow-up in: 8 months with Dr. Ladona Ridgel. You will receive a reminder letter in the mail two months in advance. If you don't receive a letter, please call our office to schedule the follow-up appointment.

## 2012-10-06 NOTE — Progress Notes (Signed)
HPI Mario Fisher returns today for followup. He is a very pleasant 79 year old man with a history of paroxysmal atrial fibrillation which has been well-controlled, hypertension, and borderline diabetes. In the interim, the patient has done well. He has minimal if any palpitations. He denies chest pain or shortness of breath except he does note shortness of breath when he bends over to tie shoes. He denies syncope. No Known Allergies   Current Outpatient Prescriptions  Medication Sig Dispense Refill  . Acetaminophen (TYLENOL PO) Take by mouth as needed.      Marland Kitchen allopurinol (ZYLOPRIM) 100 MG tablet Take 100 mg by mouth 2 (two) times daily.        . cetirizine (ZYRTEC) 10 MG tablet Take 10 mg by mouth daily.      . Cyanocobalamin (VITAMIN B-12 IJ) Inject as directed every 30 (thirty) days.        . dabigatran (PRADAXA) 150 MG CAPS Take 1 capsule (150 mg total) by mouth 2 (two) times daily.  60 capsule  5  . Glucosamine-Chondroit-Vit C-Mn (GLUCOSAMINE CHONDR 1500 COMPLX PO) Take 1,500 mg by mouth.        . Multiple Vitamin (MULTIVITAMIN) tablet Take 1 tablet by mouth daily.        . Red Yeast Rice Extract (RED YEAST RICE PO) Take 600 mg by mouth 3 (three) times daily.       . Tamsulosin HCl (FLOMAX) 0.4 MG CAPS Take 0.4 mg by mouth daily.        . valsartan-hydrochlorothiazide (DIOVAN-HCT) 80-12.5 MG per tablet Take 1 tablet by mouth daily.           Past Medical History  Diagnosis Date  . Atrial fibrillation   . Hypertension   . BPH (benign prostatic hypertrophy)   . Hyperlipidemia   . Stroke     remote CVA noted on MRI  . Glucose intolerance (impaired glucose tolerance)   . Chronic anticoagulation     on Pradaxa    ROS:   All systems reviewed and negative except as noted in the HPI.   Past Surgical History  Procedure Date  . Cholecystectomy   . US echocardiography 02/25/2005    ef 55-60%     Family History  Problem Relation Age of Onset  . Arrhythmia Mother   . Heart  attack Father     x2  . Hypertension Father   . Stroke Father   . Heart disease Sister   . Hypertension Brother      History   Social History  . Marital Status: Married    Spouse Name: N/A    Number of Children: N/A  . Years of Education: N/A   Occupational History  . Not on file.   Social History Main Topics  . Smoking status: Former Smoker    Quit date: 09/01/1968  . Smokeless tobacco: Never Used  . Alcohol Use: No  . Drug Use: No  . Sexually Active: Not Currently   Other Topics Concern  . Not on file   Social History Narrative  . No narrative on file     BP 131/69  Pulse 80  Ht 5\' 5"  (1.651 m)  Wt 193 lb 12.8 oz (87.907 kg)  BMI 32.25 kg/m2  Physical Exam:  Well appearing 79 year old man, NAD HEENT: Unremarkable Neck:  No JVD, no thyromegally Lungs:  Clear with no wheezes, rales, or rhonchi. HEART:  Regular rate rhythm, no murmurs, no rubs, no clicks Abd:  soft, positive bowel sounds,  no organomegally, no rebound, no guarding Ext:  2 plus pulses, no edema, no cyanosis, no clubbing Skin:  No rashes no nodules Neuro:  CN II through XII intact, motor grossly intact   Assess/Plan:

## 2013-01-28 ENCOUNTER — Ambulatory Visit (INDEPENDENT_AMBULATORY_CARE_PROVIDER_SITE_OTHER): Payer: Medicare Other | Admitting: Cardiovascular Disease

## 2013-01-28 ENCOUNTER — Encounter: Payer: Self-pay | Admitting: Cardiovascular Disease

## 2013-01-28 VITALS — BP 116/64 | HR 49 | Ht 65.0 in | Wt 190.1 lb

## 2013-01-28 DIAGNOSIS — I10 Essential (primary) hypertension: Secondary | ICD-10-CM

## 2013-01-28 DIAGNOSIS — I4891 Unspecified atrial fibrillation: Secondary | ICD-10-CM

## 2013-01-28 NOTE — Progress Notes (Signed)
Mario Fisher Date of Birth  1934-02-18 [facility] [address]    [address] [place], [place]  [address] [phone]  Fax  [phone]  Problem list: 1. Hypertension 2. Hyperlipidemia 3. Paroxysmal atrial fibrillation 4. Gout  History of Present Illness:  79 year old gentleman with a history of hypertension and hyperlipidemia. He has intermittent atrial fibrillation.   He complains of back pain and knee pain. He has been found to have sick sinus syndrome on event monitor.  Jan 28, 2013: We have treated him with Pradaxa since diagnosing him with SSS.  He has paroxysmal A-Fib. He is feeling well.  He is not planting a garden this year.  The wildlife eats all of his plants.         Current Outpatient Prescriptions  Medication Sig Dispense Refill  . Acetaminophen (TYLENOL PO) Take by mouth as needed.      Marland Kitchen allopurinol (ZYLOPRIM) 100 MG tablet Take 100 mg by mouth 2 (two) times daily.        . Cyanocobalamin (VITAMIN B-12 IJ) Inject as directed every 30 (thirty) days.        . dabigatran (PRADAXA) 150 MG CAPS Take 1 capsule (150 mg total) by mouth 2 (two) times daily.  60 capsule  5  . Glucosamine-Chondroit-Vit C-Mn (GLUCOSAMINE CHONDR 1500 COMPLX PO) Take 1,500 mg by mouth.        . loratadine (CLARITIN) 10 MG tablet Take 10 mg by mouth daily.      . Multiple Vitamin (MULTIVITAMIN) tablet Take 1 tablet by mouth daily.        . Red Yeast Rice Extract (RED YEAST RICE PO) Take 600 mg by mouth 3 (three) times daily.       . Tamsulosin HCl (FLOMAX) 0.4 MG CAPS Take 0.4 mg by mouth daily.        . valsartan-hydrochlorothiazide (DIOVAN-HCT) 80-12.5 MG per tablet Take 1 tablet by mouth daily.         No current facility-administered medications for this visit.      No Known Allergies  Past Medical History  Diagnosis Date  . Atrial fibrillation   . Hypertension   . BPH (benign prostatic hypertrophy)   . Hyperlipidemia   . Stroke     remote CVA noted on MRI  .  Glucose intolerance (impaired glucose tolerance)   . Chronic anticoagulation     on Pradaxa    Past Surgical History  Procedure Laterality Date  . Cholecystectomy    . US echocardiography  02/25/2005    ef 55-60%    History  Smoking status  . Former Smoker  . Quit date: 09/01/1968  Smokeless tobacco  . Never Used    History  Alcohol Use No    Family History  Problem Relation Age of Onset  . Arrhythmia Mother   . Heart attack Father     x2  . Hypertension Father   . Stroke Father   . Heart disease Sister   . Hypertension Brother     Reviw of Systems:  Reviewed in the HPI.  All other systems are negative.  Physical Exam: BP 116/64  Pulse 49  Ht 5\' 5"  (1.651 m)  Wt 190 lb 1.9 oz (86.238 kg)  BMI 31.64 kg/m2 The patient is alert and oriented x 3.  The mood and affect are normal.   Skin: warm and dry.  Color is normal.    HEENT:   the sclera are nonicteric.  The mucous membranes are  moist.  The carotids are 2+ without bruits.  There is no thyromegaly.  There is no JVD.    Lungs: clear.  The chest wall is non tender.    Heart: regular rate with a normal S1 and S2.  There are no murmurs, gallops, or rubs. The PMI is not displaced.     Abdomen: good bowel sounds.  There is no guarding or rebound.  There is no hepatosplenomegaly or tenderness.  There are no masses.   Extremities:  no clubbing, cyanosis, or edema.  The legs are without rashes.  The distal pulses are intact.   Neuro:  Cranial nerves II - XII are intact.  Motor and sensory functions are intact.    The gait is normal.  ECG: Jan 28, 2013:  Sinus brady at 55.  Inc. RBBB. NS T wave abnormality. Assessment / Plan:

## 2013-01-28 NOTE — Assessment & Plan Note (Signed)
Mario Fisher is doing well. He's not had any episodes of chest pain or shortness breath. We'll see him again in one year for followup visit.

## 2013-01-28 NOTE — Assessment & Plan Note (Signed)
He still in sinus rhythm. He has a history of paroxysmal atrial fibrillation. He'll see Dr. Ladona Ridgel in approximately 6 months.

## 2013-01-28 NOTE — Patient Instructions (Addendum)
Your physician wants you to follow-up in: 1 year  You will receive a reminder letter in the mail two months in advance. If you don't receive a letter, please call our office to schedule the follow-up appointment.  Your physician recommends that you return for a FASTING lipid profile: 1 year   Your physician recommends that you continue on your current medications as directed. Please refer to the Current Medication list given to you today.    

## 2013-02-08 ENCOUNTER — Telehealth: Payer: Self-pay | Admitting: Cardiovascular Disease

## 2013-02-08 MED ORDER — DABIGATRAN ETEXILATE MESYLATE 150 MG PO CAPS: 150.0000 mg | ORAL_CAPSULE | Freq: Two times a day (BID) | ORAL | Status: DC

## 2013-02-08 NOTE — Telephone Encounter (Signed)
Fax Received. Refill Completed. Mario Fisher (R.M.A)   

## 2013-02-08 NOTE — Telephone Encounter (Signed)
New Problem:    Patient's wife called in because the patient runs out of his dabigatran (PRADAXA) 150 MG CAPS today and would like to speak with someone about the refill process.  States that they have had trouble for the past several months getting this particular medication filled.  Please call back.

## 2013-07-05 ENCOUNTER — Ambulatory Visit (INDEPENDENT_AMBULATORY_CARE_PROVIDER_SITE_OTHER): Payer: Medicare Other | Admitting: Internal Medicine

## 2013-07-05 ENCOUNTER — Encounter: Payer: Self-pay | Admitting: Internal Medicine

## 2013-07-05 VITALS — BP 114/72 | HR 48 | Ht 65.0 in | Wt 191.8 lb

## 2013-07-05 DIAGNOSIS — I498 Other specified cardiac arrhythmias: Secondary | ICD-10-CM

## 2013-07-05 DIAGNOSIS — I4891 Unspecified atrial fibrillation: Secondary | ICD-10-CM

## 2013-07-05 DIAGNOSIS — R001 Bradycardia, unspecified: Secondary | ICD-10-CM | POA: Insufficient documentation

## 2013-07-05 NOTE — Assessment & Plan Note (Signed)
He is currently asymptomatic. We discussed the warning signs for possible need for a pacemaker, and he will undergo watchful waiting.

## 2013-07-05 NOTE — Patient Instructions (Signed)
Your physician wants you to follow-up in: 12 months with Dr. Taylor. You will receive a reminder letter in the mail two months in advance. If you don't receive a letter, please call our office to schedule the follow-up appointment.    

## 2013-07-05 NOTE — Assessment & Plan Note (Signed)
He has minimal palpitations and minimal symptomatic atrial fibrillation. He'll continue his systemic anticoagulation.
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
## 2013-08-23 ENCOUNTER — Other Ambulatory Visit: Payer: Self-pay | Admitting: Cardiovascular Disease

## 2014-01-30 ENCOUNTER — Ambulatory Visit (INDEPENDENT_AMBULATORY_CARE_PROVIDER_SITE_OTHER): Payer: Medicare Other | Admitting: Cardiovascular Disease

## 2014-01-30 ENCOUNTER — Encounter: Payer: Self-pay | Admitting: Cardiovascular Disease

## 2014-01-30 VITALS — BP 112/68 | HR 78 | Ht 65.0 in | Wt 188.8 lb

## 2014-01-30 DIAGNOSIS — I10 Essential (primary) hypertension: Secondary | ICD-10-CM

## 2014-01-30 DIAGNOSIS — I4891 Unspecified atrial fibrillation: Secondary | ICD-10-CM

## 2014-01-30 NOTE — Patient Instructions (Addendum)
  Check the price of the following anticoagulants:  Pradaxa 150 mg twice a day Xarelto 20 mg a day Eliquis 5 mg twice a day Savaysa 60 mg a day  Your physician wants you to follow-up in: 1 year with Dr. Elease Hashimoto.  You will receive a reminder letter in the mail two months in advance. If you don't receive a letter, please call our office to schedule the follow-up appointment.

## 2014-01-30 NOTE — Assessment & Plan Note (Signed)
He remains stable.  Continue current meds  

## 2014-01-30 NOTE — Progress Notes (Signed)
Mario Fisher Date of Birth  October 24, 1933 [facility] [address]    [address] [place], [place]  [address] [phone]  Fax  [phone]  Problem list: 1. Hypertension 2. Hyperlipidemia 3. Paroxysmal atrial fibrillation 4. Gout  History of Present Illness:  80 year old gentleman with a history of hypertension and hyperlipidemia. He has intermittent atrial fibrillation.   He complains of back pain and knee pain. He has been found to have sick sinus syndrome on event monitor.  Jan 28, 2013: We have treated him with Pradaxa since diagnosing him with SSS.  He has paroxysmal A-Fib. He is feeling well.  He is not planting a garden this year.  The wildlife eats all of his plants.    January 30, 2014:  Mario Fisher is doing well.  No CP.  No dyspnea.  No syncope.        Current Outpatient Prescriptions  Medication Sig Dispense Refill  . Acetaminophen (TYLENOL PO) Take by mouth as needed.      Marland Kitchen allopurinol (ZYLOPRIM) 100 MG tablet Take 100 mg by mouth 2 (two) times daily.        . brimonidine (ALPHAGAN) 0.15 % ophthalmic solution Place 1 drop into both eyes 2 (two) times daily.      . Cholecalciferol (VITAMIN D-3) 5000 UNITS TABS Take 1 tablet by mouth daily.      . Cyanocobalamin (VITAMIN B-12 IJ) Inject as directed every 30 (thirty) days.        . Glucosamine-Chondroit-Vit C-Mn (GLUCOSAMINE CHONDR 1500 COMPLX PO) Take 1,500 mg by mouth.        . loratadine (CLARITIN) 10 MG tablet Take 10 mg by mouth daily.      . Multiple Vitamin (MULTIVITAMIN) tablet Take 1 tablet by mouth daily.        . Omega-3 Fatty Acids (FISH OIL) 1200 MG CAPS Take 2 capsules in the am and 1 capsule in the pm      . Polyethyl Glycol-Propyl Glycol (SYSTANE ULTRA) 0.4-0.3 % SOLN Apply 1 drop to eye daily.      Marland Kitchen PRADAXA 150 MG CAPS capsule TAKE ONE CAPSULE BY MOUTH TWICE DAILY  60 capsule  6  . Red Yeast Rice Extract (RED YEAST RICE PO) Take 600 mg by mouth 3 (three) times daily.       .  Tamsulosin HCl (FLOMAX) 0.4 MG CAPS Take 0.4 mg by mouth daily.        . valsartan-hydrochlorothiazide (DIOVAN-HCT) 80-12.5 MG per tablet Take 1 tablet by mouth daily.         No current facility-administered medications for this visit.      No Known Allergies  Past Medical History  Diagnosis Date  . Atrial fibrillation   . Hypertension   . BPH (benign prostatic hypertrophy)   . Hyperlipidemia   . Stroke     remote CVA noted on MRI  . Glucose intolerance (impaired glucose tolerance)   . Chronic anticoagulation     on Pradaxa    Past Surgical History  Procedure Laterality Date  . Cholecystectomy    . US echocardiography  02/25/2005    ef 55-60%    History  Smoking status  . Former Smoker  . Quit date: 09/01/1968  Smokeless tobacco  . Never Used    History  Alcohol Use No    Family History  Problem Relation Age of Onset  . Arrhythmia Mother   . Heart attack Father     x2  .  Hypertension Father   . Stroke Father   . Heart disease Sister   . Hypertension Brother     Reviw of Systems:  Reviewed in the HPI.  All other systems are negative.  Physical Exam: BP 112/68  Pulse 78  Ht 5\' 5"  (1.651 m)  Wt 188 lb 12.8 oz (85.639 kg)  BMI 31.42 kg/m2 The patient is alert and oriented x 3.  The mood and affect are normal.   Skin: warm and dry.  Color is normal.    HEENT:   the sclera are nonicteric.  The mucous membranes are moist.  The carotids are 2+ without bruits.  There is no thyromegaly.  There is no JVD.    Lungs: clear.  The chest wall is non tender.    Heart: regular rate with a normal S1 and S2.  There are no murmurs, gallops, or rubs. The PMI is not displaced.     Abdomen: good bowel sounds.  There is no guarding or rebound.  There is no hepatosplenomegaly or tenderness.  There are no masses.   Extremities:  no clubbing, cyanosis, or edema.  The legs are without rashes.  The distal pulses are intact.   Neuro:  Cranial nerves II - XII are intact.   Motor and sensory functions are intact.    The gait is normal.  ECG: Jan 28, 2013:  Sinus brady at 69.  Inc. RBBB. NS T wave abnormality. Assessment / Plan:

## 2014-01-30 NOTE — Assessment & Plan Note (Signed)
BPs have been ok

## 2014-04-02 ENCOUNTER — Other Ambulatory Visit: Payer: Self-pay | Admitting: Cardiovascular Disease

## 2014-10-08 ENCOUNTER — Other Ambulatory Visit: Payer: Self-pay | Admitting: Cardiovascular Disease

## 2015-02-27 ENCOUNTER — Ambulatory Visit (INDEPENDENT_AMBULATORY_CARE_PROVIDER_SITE_OTHER): Payer: Medicare Other | Admitting: Cardiovascular Disease

## 2015-02-27 ENCOUNTER — Encounter: Payer: Self-pay | Admitting: Cardiovascular Disease

## 2015-02-27 VITALS — BP 126/62 | HR 52 | Ht 65.0 in | Wt 186.0 lb

## 2015-02-27 DIAGNOSIS — I480 Paroxysmal atrial fibrillation: Secondary | ICD-10-CM | POA: Diagnosis not present

## 2015-02-27 DIAGNOSIS — E785 Hyperlipidemia, unspecified: Secondary | ICD-10-CM | POA: Diagnosis not present

## 2015-02-27 DIAGNOSIS — I10 Essential (primary) hypertension: Secondary | ICD-10-CM | POA: Diagnosis not present

## 2015-02-27 LAB — LIPID PANEL
CHOLESTEROL: 132 mg/dL (ref 0–200)
HDL: 31.1 mg/dL — AB (ref >39.00)
LDL Cholesterol: 77 mg/dL (ref 0–99)
NonHDL: 100.9
TRIGLYCERIDES: 118 mg/dL (ref 0.0–149.0)
Total CHOL/HDL Ratio: 4
VLDL: 23.6 mg/dL (ref 0.0–40.0)

## 2015-02-27 LAB — BASIC METABOLIC PANEL
BUN: 32 mg/dL — ABNORMAL HIGH (ref 6–23)
CALCIUM: 9.5 mg/dL (ref 8.4–10.5)
CO2: 27 mEq/L (ref 19–32)
CREATININE: 1.68 mg/dL — AB (ref 0.40–1.50)
Chloride: 101 mEq/L (ref 96–112)
GFR: 42.07 mL/min — AB (ref >60.00)
Glucose, Bld: 134 mg/dL — ABNORMAL HIGH (ref 70–99)
Potassium: 3.8 mEq/L (ref 3.5–5.1)
Sodium: 137 mEq/L (ref 135–145)

## 2015-02-27 LAB — HEPATIC FUNCTION PANEL
ALBUMIN: 4.3 g/dL (ref 3.5–5.2)
ALK PHOS: 58 U/L (ref 39–117)
ALT: 16 U/L (ref 0–53)
AST: 17 U/L (ref 0–37)
BILIRUBIN DIRECT: 0.2 mg/dL (ref 0.0–0.3)
BILIRUBIN TOTAL: 1.2 mg/dL (ref 0.2–1.2)
Total Protein: 7.2 g/dL (ref 6.0–8.3)

## 2015-02-27 NOTE — Progress Notes (Signed)
Mario Fisher Date of Birth  1934-03-03 [facility] [address]    [address] [place], [place]  [address] [phone]  Fax  [phone]  Problem list: 1. Hypertension 2. Hyperlipidemia 3. Paroxysmal atrial fibrillation  - has occasional episodes of A-fib  4. Gout  History of Present Illness:  81 year old gentleman with a history of hypertension and hyperlipidemia. He has intermittent atrial fibrillation.   He complains of back pain and knee pain. He has been found to have sick sinus syndrome on event monitor.  Jan 28, 2013: We have treated him with Pradaxa since diagnosing him with SSS.  He has paroxysmal A-Fib. He is feeling well.  He is not planting a garden this year.  The wildlife eats all of his plants.    January 30, 2014:  Mario Fisher is doing well.  No CP.  No dyspnea.  No syncope.  February 27, 2015:  Staying active.  No CP or dyspnea  Able to do all of his chores without any problems     Current Outpatient Prescriptions  Medication Sig Dispense Refill  . Acetaminophen (TYLENOL PO) Take by mouth as needed.    Marland Kitchen allopurinol (ZYLOPRIM) 100 MG tablet Take 100 mg by mouth 2 (two) times daily.      . brimonidine (ALPHAGAN) 0.15 % ophthalmic solution Place 1 drop into both eyes 2 (two) times daily.    . Cholecalciferol (VITAMIN D-3) 5000 UNITS TABS Take 1 tablet by mouth daily.    . Cyanocobalamin (VITAMIN B-12 IJ) Inject as directed every 30 (thirty) days.      . Glucosamine-Chondroit-Vit C-Mn (GLUCOSAMINE CHONDR 1500 COMPLX PO) Take 1,500 mg by mouth.      . loratadine (CLARITIN) 10 MG tablet Take 10 mg by mouth daily.    . Multiple Vitamin (MULTIVITAMIN) tablet Take 1 tablet by mouth daily.      . Omega-3 Fatty Acids (FISH OIL) 1200 MG CAPS Take 2 capsules in the am and 1 capsule in the pm    . Polyethyl Glycol-Propyl Glycol (SYSTANE ULTRA) 0.4-0.3 % SOLN Apply 1 drop to eye daily.    Marland Kitchen PRADAXA 150 MG CAPS capsule TAKE ONE CAPSULE BY MOUTH TWICE DAILY 60  capsule 5  . Red Yeast Rice Extract (RED YEAST RICE PO) Take 600 mg by mouth 3 (three) times daily.     . Tamsulosin HCl (FLOMAX) 0.4 MG CAPS Take 0.4 mg by mouth daily.      . valsartan-hydrochlorothiazide (DIOVAN-HCT) 80-12.5 MG per tablet Take 1 tablet by mouth daily.       No current facility-administered medications for this visit.      No Known Allergies  Past Medical History  Diagnosis Date  . Atrial fibrillation   . Hypertension   . BPH (benign prostatic hypertrophy)   . Hyperlipidemia   . Stroke     remote CVA noted on MRI  . Glucose intolerance (impaired glucose tolerance)   . Chronic anticoagulation     on Pradaxa    Past Surgical History  Procedure Laterality Date  . Cholecystectomy    . US echocardiography  02/25/2005    ef 55-60%    History  Smoking status  . Former Smoker  . Quit date: 09/01/1968  Smokeless tobacco  . Never Used    History  Alcohol Use No    Family History  Problem Relation Age of Onset  . Arrhythmia Mother   . Heart attack Father     x2  .  Hypertension Father   . Stroke Father   . Heart disease Sister   . Hypertension Brother     Reviw of Systems:  Reviewed in the HPI.  All other systems are negative.  Physical Exam: BP 126/62 mmHg  Pulse 52  Ht 5\' 5"  (1.651 m)  Wt 84.369 kg (186 lb)  BMI 30.95 kg/m2 The patient is alert and oriented x 3.  The mood and affect are normal.   Skin: warm and dry.  Color is normal.    HEENT:   the sclera are nonicteric.  The mucous membranes are moist.  The carotids are 2+ without bruits.  There is no thyromegaly.  There is no JVD.    Lungs: clear.  The chest wall is non tender.    Heart: regular rate with a normal S1 and S2.  There are no murmurs, gallops, or rubs. The PMI is not displaced.     Abdomen: good bowel sounds.  There is no guarding or rebound.  There is no hepatosplenomegaly or tenderness.  There are no masses.   Extremities:  no clubbing, cyanosis, or edema.  The legs  are without rashes.  The distal pulses are intact.   Neuro:  Cranial nerves II - XII are intact.  Motor and sensory functions are intact.    The gait is normal.  ECG: February 27, 2015:  Sinus brady at 86 with 1st degree AV block .  Occasional PACS  Assessment / Plan:   1. Hypertension  - his blood pressure is well-controlled. Continue current medications.  2. Hyperlipidemia - we will check fasting lipids today.  3. Paroxysmal atrial fibrillation  - has occasional episodes of A-fib  - he is currently on Pradaxa. He would like to look at some of the other new  NOACs  . Pradaxa is fairly expensive.   4. Gout:      Nahser, 46, MD  02/27/2015 11:26 AM    [facility] [address] [place],  [address] [place], [place]  [place] Pager [phone] Phone: [phone]; Fax: [phone]   [facility]  [address] [place], [place]  [place] [id number]    Fax [phone]

## 2015-02-27 NOTE — Patient Instructions (Addendum)
Check the price of the following anticoagulants:  Pradaxa 150 mg twice a day Xarelto 20 mg a day Eliquis 5 mg twice a day Savaysa 60 mg a day.  Medication Instructions:  Your physician recommends that you continue on your current medications as directed. Please refer to the Current Medication list given to you today.   Labwork: TODAY - cholesterol, liver, basic metabolic panel   Testing/Procedures: None Ordered  Follow-Up: Your physician wants you to follow-up in: 1 year with Dr. Elease Hashimoto.  You will receive a reminder letter in the mail two months in advance. If you don't receive a letter, please call our office to schedule the follow-up appointment.

## 2015-03-08 ENCOUNTER — Telehealth: Payer: Self-pay | Admitting: Cardiovascular Disease

## 2015-03-08 DIAGNOSIS — I10 Essential (primary) hypertension: Secondary | ICD-10-CM

## 2015-03-08 NOTE — Telephone Encounter (Signed)
Follow Up ° °Pt returned call//  °

## 2015-03-08 NOTE — Telephone Encounter (Signed)
Unable to reach patient.

## 2015-03-08 NOTE — Telephone Encounter (Signed)
Attempted to call patient back to report lab results; no answer, no voice mail

## 2015-03-09 NOTE — Telephone Encounter (Signed)
Reviewed lab results and plan of care to increase fluid intake, particularly water, with patient who verbalized understanding and agreement.  Patient is scheduled for repeat bmet on 8/11.

## 2015-04-12 ENCOUNTER — Other Ambulatory Visit (INDEPENDENT_AMBULATORY_CARE_PROVIDER_SITE_OTHER): Payer: Medicare Other | Admitting: *Deleted

## 2015-04-12 DIAGNOSIS — I10 Essential (primary) hypertension: Secondary | ICD-10-CM | POA: Diagnosis not present

## 2015-04-12 LAB — BASIC METABOLIC PANEL
BUN: 25 mg/dL — AB (ref 6–23)
CALCIUM: 9.4 mg/dL (ref 8.4–10.5)
CO2: 26 mEq/L (ref 19–32)
CREATININE: 1.51 mg/dL — AB (ref 0.40–1.50)
Chloride: 102 mEq/L (ref 96–112)
GFR: 47.57 mL/min — AB (ref >60.00)
Glucose, Bld: 129 mg/dL — ABNORMAL HIGH (ref 70–99)
Potassium: 3.7 mEq/L (ref 3.5–5.1)
Sodium: 137 mEq/L (ref 135–145)

## 2015-04-12 NOTE — Addendum Note (Signed)
Addended by: Tonita Phoenix on: 04/12/2015 09:54 AM   Modules accepted: Orders

## 2015-04-16 ENCOUNTER — Other Ambulatory Visit: Payer: Self-pay | Admitting: Cardiovascular Disease

## 2015-04-16 ENCOUNTER — Telehealth: Payer: Self-pay | Admitting: Cardiovascular Disease

## 2015-04-16 NOTE — Telephone Encounter (Signed)
Reviewed lab results with patient who verbalized understanding 

## 2015-04-16 NOTE — Telephone Encounter (Signed)
Follow Up ° °Pt called back for results °

## 2015-08-14 ENCOUNTER — Encounter: Payer: Self-pay | Admitting: Cardiovascular Disease

## 2015-09-14 ENCOUNTER — Telehealth: Payer: Self-pay | Admitting: Cardiovascular Disease

## 2015-09-14 NOTE — Telephone Encounter (Signed)
New message      Pt was notified that his pradaxa will be more expensive for 2017.  Ins will pay for eliquis or xarelto.  Calling to see which medication Dr Elease Hashimoto would suggest he start taking?  Patient has 6 days of pradaxa left.  Please advise

## 2015-09-14 NOTE — Telephone Encounter (Signed)
Will route this to Eligha Bridegroom, RN for Dr. Elease Hashimoto

## 2015-09-14 NOTE — Telephone Encounter (Signed)
See previous note, oops

## 2015-09-18 NOTE — Telephone Encounter (Signed)
Left message for patient to call back regarding medications.

## 2015-09-18 NOTE — Telephone Encounter (Signed)
DC Pradaxa Start Xarelto 15 mg a day ( reduced dose due to reduced creatinine clearence of 42)  Stop Pradaxa and start Xarelto the next day .

## 2015-09-19 MED ORDER — RIVAROXABAN 15 MG PO TABS: 15.0000 mg | ORAL_TABLET | Freq: Every day | ORAL | Status: DC

## 2015-09-19 NOTE — Telephone Encounter (Signed)
Spoke with patient and reviewed Dr. Harvie Bridge advice regarding stopping Pradaxa and starting Xarelto 15 mg once daily.  He states he will take his last Pradaxa tomorrow and will start Xarelto on Friday.

## 2015-09-20 ENCOUNTER — Telehealth: Payer: Self-pay

## 2015-09-20 NOTE — Telephone Encounter (Signed)
Prior auth for Xarelto 15mg sent to [facility]. 

## 2015-09-24 ENCOUNTER — Telehealth: Payer: Self-pay

## 2015-09-24 NOTE — Telephone Encounter (Signed)
Xarelto approved through 08/31/2016. PA #[id number].

## 2015-10-17 ENCOUNTER — Telehealth: Payer: Self-pay | Admitting: Cardiovascular Disease

## 2015-10-17 NOTE — Telephone Encounter (Signed)
Spoke with patient who states he has switched from pradaxa to xarelto recently due to cost.  He is concerned about recent blood pressure and pulse readings and wonders if the increased BP and low pulse are related to taking xarelto. He reports over the last 5 weeks, his heart rate has primarily been in the 50 bpm range; sometimes 40's bpm with lowest being 46 bpm.  He denies dizziness, light-headedness, or fatigue.  He states he especially feels well throughout the day as he does his regular daily activities.  I advised that his heart rate has been documented as 52, 78, and 48 bpm on last 3 office visits.  He has history of PAF and I advised that sometimes home monitors do not accurately read pulse.  He reports BP readings with very high diastolic readings of > 100.  He states he uses an automatic cuff that fits around upper part of arm with fresh batteries but he has been resting the base of the monitor in his lap.  I advised him to monitor BP tonight and tomorrow morning 1 hour after medication with the base sitting on a flat stable surface and to call me back tomorrow to report.  I advised that switching from pradaxa to xarelto should not interfere with vital signs.  He verbalized understanding and agreement with plan and thanked me for the call.

## 2015-10-17 NOTE — Telephone Encounter (Signed)
Please call,he changed to Xarelto because of his insurance.He is getting wild readings and his heart rates have been low.

## 2015-10-18 NOTE — Telephone Encounter (Signed)
Spoke with patient and advised him that the BP and pulse readings from last night and this morning look great and to continue current medications.  I advised him to call back with questions or concerns prior to next office visit.  He verbalized understanding and agreement and thanked me for the call.

## 2015-10-18 NOTE — Telephone Encounter (Signed)
Follow up  Pt c/o BP issue:  1. What are your last 5 BP readings?   10/17/15 @ 9pm 120/89 pulse 57 10/18/15 @ 8:30 a 155/81 pulse 52   Pt called to give readings

## 2015-11-05 ENCOUNTER — Ambulatory Visit (INDEPENDENT_AMBULATORY_CARE_PROVIDER_SITE_OTHER): Payer: Medicare Other | Admitting: Cardiovascular Disease

## 2015-11-05 ENCOUNTER — Encounter: Payer: Self-pay | Admitting: Cardiovascular Disease

## 2015-11-05 VITALS — BP 122/82 | HR 56 | Ht 66.0 in | Wt 191.0 lb

## 2015-11-05 DIAGNOSIS — I10 Essential (primary) hypertension: Secondary | ICD-10-CM

## 2015-11-05 DIAGNOSIS — I480 Paroxysmal atrial fibrillation: Secondary | ICD-10-CM

## 2015-11-05 NOTE — Progress Notes (Signed)
Mario Fisher Date of Birth  01/19/1934 [facility] [address]    [address] [place], [place]  [address] [phone]  Fax  [phone]  Problem list: 1. Hypertension 2. Hyperlipidemia 3. Paroxysmal atrial fibrillation  - has occasional episodes of A-fib  4. Gout  History of Present Illness:  82 year old gentleman with a history of hypertension and hyperlipidemia. He has intermittent atrial fibrillation.   He complains of back pain and knee pain. He has been found to have sick sinus syndrome on event monitor.  Jan 28, 2013: We have treated him with Pradaxa since diagnosing him with SSS.  He has paroxysmal A-Fib. He is feeling well.  He is not planting a garden this year.  The wildlife eats all of his plants.    January 30, 2014:  Mario Fisher is doing well.  No CP.  No dyspnea.  No syncope.  February 27, 2015:  Staying active.  No CP or dyspnea  Able to do all of his chores without any problems   November 05, 2015:  Doing well from a cardiac standpoint.  Think the bags under his eyes are related to fluid build up His eye doctor thought that his legs were swollen   HR has been slow.  No syncope or presyncope  Current Outpatient Prescriptions  Medication Sig Dispense Refill  . Acetaminophen (TYLENOL PO) Take by mouth as needed (for pain).     Marland Kitchen allopurinol (ZYLOPRIM) 100 MG tablet Take 100 mg by mouth 2 (two) times daily.      . brimonidine (ALPHAGAN) 0.15 % ophthalmic solution Place 1 drop into both eyes 2 (two) times daily.    . Cholecalciferol (VITAMIN D-3) 5000 UNITS TABS Take 1 tablet by mouth daily.    . Cyanocobalamin (VITAMIN B-12 IJ) Inject as directed every 30 (thirty) days.      . Glucosamine-Chondroit-Vit C-Mn (GLUCOSAMINE CHONDR 1500 COMPLX PO) Take 1,500 mg by mouth.      . loratadine (CLARITIN) 10 MG tablet Take 10 mg by mouth daily.    . Multiple Vitamin (MULTIVITAMIN) tablet Take 1 tablet by mouth daily.      . Omega-3 Fatty Acids (FISH OIL) 1200  MG CAPS Take 2 capsules in the am and 1 capsule in the pm    . Polyethyl Glycol-Propyl Glycol (SYSTANE ULTRA) 0.4-0.3 % SOLN Apply 1 drop to eye daily.    . Red Yeast Rice Extract (RED YEAST RICE PO) Take 600 mg by mouth 3 (three) times daily.     . Rivaroxaban (XARELTO) 15 MG TABS tablet Take 1 tablet (15 mg total) by mouth daily with supper. 30 tablet 11  . Tamsulosin HCl (FLOMAX) 0.4 MG CAPS Take 0.4 mg by mouth daily.      . valsartan-hydrochlorothiazide (DIOVAN-HCT) 80-12.5 MG per tablet Take 1 tablet by mouth daily.       No current facility-administered medications for this visit.      No Known Allergies  Past Medical History  Diagnosis Date  . Atrial fibrillation (HCC)   . Hypertension   . BPH (benign prostatic hypertrophy)   . Hyperlipidemia   . Stroke [facility])     remote CVA noted on MRI  . Glucose intolerance (impaired glucose tolerance)   . Chronic anticoagulation     on Pradaxa    Past Surgical History  Procedure Laterality Date  . Cholecystectomy    . US echocardiography  02/25/2005    ef 55-60%    History  Smoking status  . Former Smoker  . Quit date: 09/01/1968  Smokeless tobacco  . Never Used    History  Alcohol Use No    Family History  Problem Relation Age of Onset  . Arrhythmia Mother   . Heart attack Father     x2  . Hypertension Father   . Stroke Father   . Heart disease Sister   . Hypertension Brother     Reviw of Systems:  Reviewed in the HPI.  All other systems are negative.  Physical Exam: BP 122/82 mmHg  Pulse 56  Ht 5\' 6"  (1.676 m)  Wt 191 lb (86.637 kg)  BMI 30.84 kg/m2 The patient is alert and oriented x 3.  The mood and affect are normal.   Skin: warm and dry.  Color is normal.    HEENT:   the sclera are nonicteric.  The mucous membranes are moist.  The carotids are 2+ without bruits.  There is no thyromegaly.  There is no JVD.    Lungs: clear.  The chest wall is non tender.    Heart: regular rate with a normal S1 and  S2.  There are no murmurs, gallops, or rubs. The PMI is not displaced.     Abdomen: good bowel sounds.  There is no guarding or rebound.  There is no hepatosplenomegaly or tenderness.  There are no masses.   Extremities:  no clubbing, cyanosis, or edema.  The legs are without rashes.  The distal pulses are intact.   Neuro:  Cranial nerves II - XII are intact.  Motor and sensory functions are intact.    The gait is normal.  ECG: November 05, 2015: Sinus brady at 38 with sinus arrhythmia.   1st degree AV block   Assessment / Plan:   1. Hypertension  - his blood pressure is well-controlled. Continue current medications.  2. Hyperlipidemia - we will check fasting lipids today.  3. Paroxysmal atrial fibrillation  - has occasional episodes of A-fib  - on Xarelto. HR is slow.  Is not on any rate slowing agents.   has SSS. No indication for pacer at this point.  Will see him in 6 months to re-evaluate.   4. Gout:      Mario Fisher, 61, MD  11/05/2015 8:15 AM    [facility] [address] [place],  [address] [place], [place]  [place] Pager [phone] Phone: [phone]; Fax: [phone]   [facility]  [address] [place], [place]  [place] [phone]    Fax [phone]

## 2015-11-05 NOTE — Patient Instructions (Signed)
Medication Instructions:  Your physician recommends that you continue on your current medications as directed. Please refer to the Current Medication list given to you today.   Labwork: None Ordered   Testing/Procedures: Your physician has requested that you have an echocardiogram. Echocardiography is a painless test that uses sound waves to create images of your heart. It provides your doctor with information about the size and shape of your heart and how well your heart's chambers and valves are working. This procedure takes approximately one hour. There are no restrictions for this procedure.   Follow-Up Your physician wants you to follow-up in: 6 months with Dr. Nahser.  You will receive a reminder letter in the mail two months in advance. If you don't receive a letter, please call our office to schedule the follow-up appointment.   If you need a refill on your cardiac medications before your next appointment, please call your pharmacy.   Thank you for choosing [facility]! Karsynn Deweese, RN [phone]    

## 2015-11-16 ENCOUNTER — Ambulatory Visit (HOSPITAL_COMMUNITY): Payer: Medicare Other | Attending: Cardiovascular Disease

## 2015-11-16 ENCOUNTER — Other Ambulatory Visit: Payer: Self-pay

## 2015-11-16 DIAGNOSIS — I480 Paroxysmal atrial fibrillation: Secondary | ICD-10-CM | POA: Diagnosis not present

## 2015-11-16 DIAGNOSIS — I119 Hypertensive heart disease without heart failure: Secondary | ICD-10-CM | POA: Diagnosis not present

## 2015-11-16 DIAGNOSIS — I4891 Unspecified atrial fibrillation: Secondary | ICD-10-CM | POA: Diagnosis present

## 2015-11-16 DIAGNOSIS — Z8673 Personal history of transient ischemic attack (TIA), and cerebral infarction without residual deficits: Secondary | ICD-10-CM | POA: Insufficient documentation

## 2015-11-16 DIAGNOSIS — Z87891 Personal history of nicotine dependence: Secondary | ICD-10-CM | POA: Insufficient documentation

## 2015-11-16 DIAGNOSIS — E785 Hyperlipidemia, unspecified: Secondary | ICD-10-CM | POA: Insufficient documentation

## 2015-11-16 DIAGNOSIS — I071 Rheumatic tricuspid insufficiency: Secondary | ICD-10-CM | POA: Insufficient documentation

## 2015-11-16 DIAGNOSIS — Z8249 Family history of ischemic heart disease and other diseases of the circulatory system: Secondary | ICD-10-CM | POA: Insufficient documentation

## 2016-04-17 ENCOUNTER — Encounter: Payer: Self-pay | Admitting: Cardiovascular Disease

## 2016-05-15 ENCOUNTER — Other Ambulatory Visit: Payer: Self-pay | Admitting: Family Medicine

## 2016-05-15 ENCOUNTER — Ambulatory Visit: Payer: Medicare Other | Admitting: Cardiovascular Disease

## 2016-05-15 DIAGNOSIS — H5340 Unspecified visual field defects: Secondary | ICD-10-CM

## 2016-05-22 ENCOUNTER — Ambulatory Visit
Admission: RE | Admit: 2016-05-22 | Discharge: 2016-05-22 | Disposition: A | Discharge status: Home / Self Care | Payer: Medicare Other | Source: Ambulatory Visit | Attending: Family Medicine | Admitting: Family Medicine

## 2016-05-22 DIAGNOSIS — H5340 Unspecified visual field defects: Secondary | ICD-10-CM

## 2016-06-09 ENCOUNTER — Encounter: Payer: Self-pay | Admitting: Cardiovascular Disease

## 2016-06-20 ENCOUNTER — Encounter: Payer: Self-pay | Admitting: Cardiovascular Disease

## 2016-06-20 ENCOUNTER — Ambulatory Visit (INDEPENDENT_AMBULATORY_CARE_PROVIDER_SITE_OTHER): Payer: Medicare Other | Admitting: Cardiovascular Disease

## 2016-06-20 VITALS — BP 140/86 | HR 67 | Ht 66.0 in | Wt 191.8 lb

## 2016-06-20 DIAGNOSIS — I10 Essential (primary) hypertension: Secondary | ICD-10-CM

## 2016-06-20 DIAGNOSIS — E782 Mixed hyperlipidemia: Secondary | ICD-10-CM | POA: Diagnosis not present

## 2016-06-20 DIAGNOSIS — I480 Paroxysmal atrial fibrillation: Secondary | ICD-10-CM

## 2016-06-20 LAB — COMPREHENSIVE METABOLIC PANEL
ALBUMIN: 4.2 g/dL (ref 3.6–5.1)
ALK PHOS: 57 U/L (ref 40–115)
ALT: 20 U/L (ref 9–46)
AST: 22 U/L (ref 10–35)
BILIRUBIN TOTAL: 1.3 mg/dL — AB (ref 0.2–1.2)
BUN: 18 mg/dL (ref 7–25)
CO2: 28 mmol/L (ref 20–31)
CREATININE: 1.33 mg/dL — AB (ref 0.70–1.11)
Calcium: 9.6 mg/dL (ref 8.6–10.3)
Chloride: 99 mmol/L (ref 98–110)
Glucose, Bld: 121 mg/dL — ABNORMAL HIGH (ref 65–99)
Potassium: 4.1 mmol/L (ref 3.5–5.3)
SODIUM: 137 mmol/L (ref 135–146)
TOTAL PROTEIN: 7 g/dL (ref 6.1–8.1)

## 2016-06-20 LAB — LIPID PANEL
Cholesterol: 149 mg/dL (ref 125–200)
HDL: 42 mg/dL (ref >=40)
LDL CALC: 81 mg/dL (ref <130)
Total CHOL/HDL Ratio: 3.5 Ratio (ref <=5.0)
Triglycerides: 128 mg/dL (ref <150)
VLDL: 26 mg/dL (ref <30)

## 2016-06-20 NOTE — Progress Notes (Signed)
Mario Fisher Date of Birth  April 17, 1934 [facility] [address]    [address] [place], [place]  [address] [phone]  Fax  [phone]  Problem list: 1. Hypertension 2. Hyperlipidemia 3. Paroxysmal atrial fibrillation  - has occasional episodes of A-fib  4. Gout  History of Present Illness:  82 year old gentleman with a history of hypertension and hyperlipidemia. He has intermittent atrial fibrillation.   He complains of back pain and knee pain. He has been found to have sick sinus syndrome on event monitor.  Jan 28, 2013: We have treated him with Pradaxa since diagnosing him with SSS.  He has paroxysmal A-Fib. He is feeling well.  He is not planting a garden this year.  The wildlife eats all of his plants.    January 30, 2014:  Mario Fisher is doing well.  No CP.  No dyspnea.  No syncope.  February 27, 2015:  Staying active.  No CP or dyspnea  Able to do all of his chores without any problems   November 05, 2015:  Doing well from a cardiac standpoint.  Think the bags under his eyes are related to fluid build up His eye doctor thought that his legs were swollen   HR has been slow.  No syncope or presyncope  Oct. 20, 2017:  Overall doing well Records his BP  - quite variable . Has occasional headaches. He he still eats some salty foods on occasion.  Current Outpatient Prescriptions  Medication Sig Dispense Refill  . Acetaminophen (TYLENOL PO) Take 1 tablet by mouth as needed (for pain).     Marland Kitchen allopurinol (ZYLOPRIM) 100 MG tablet Take 100 mg by mouth 2 (two) times daily.      . brimonidine (ALPHAGAN) 0.15 % ophthalmic solution Place 1 drop into both eyes 2 (two) times daily.    . Cholecalciferol (VITAMIN D-3) 5000 UNITS TABS Take 1 tablet by mouth daily.    . Cyanocobalamin (VITAMIN B-12 IJ) Inject as directed every 30 (thirty) days.      . Glucosamine-Chondroit-Vit C-Mn (GLUCOSAMINE CHONDR 1500 COMPLX PO) Take 1,500 mg by mouth daily.     Marland Kitchen loratadine  (CLARITIN) 10 MG tablet Take 10 mg by mouth daily.    . Multiple Vitamin (MULTIVITAMIN) tablet Take 1 tablet by mouth daily.      . Omega-3 Fatty Acids (FISH OIL) 1200 MG CAPS Take 2 capsules by mouth in the am and 1 capsule by mouth in the pm    . Polyethyl Glycol-Propyl Glycol (SYSTANE ULTRA) 0.4-0.3 % SOLN Apply 1 drop to eye daily.    . Red Yeast Rice Extract (RED YEAST RICE PO) Take 600 mg by mouth 3 (three) times daily.     . Rivaroxaban (XARELTO) 15 MG TABS tablet Take 1 tablet (15 mg total) by mouth daily with supper. 30 tablet 11  . Tamsulosin HCl (FLOMAX) 0.4 MG CAPS Take 0.4 mg by mouth daily.      . valsartan-hydrochlorothiazide (DIOVAN-HCT) 80-12.5 MG per tablet Take 1 tablet by mouth daily.       No current facility-administered medications for this visit.       No Known Allergies  Past Medical History:  Diagnosis Date  . Atrial fibrillation (HCC)   . BPH (benign prostatic hypertrophy)   . Chronic anticoagulation    on Pradaxa  . Glucose intolerance (impaired glucose tolerance)   . Hyperlipidemia   . Hypertension   . Stroke [facility])    remote CVA noted  on MRI    Past Surgical History:  Procedure Laterality Date  . CHOLECYSTECTOMY    . US ECHOCARDIOGRAPHY  02/25/2005   ef 55-60%    History  Smoking Status  . Former Smoker  . Quit date: 09/01/1968  Smokeless Tobacco  . Never Used    History  Alcohol Use No    Family History  Problem Relation Age of Onset  . Arrhythmia Mother   . Heart attack Father     x2  . Hypertension Father   . Stroke Father   . Heart disease Sister   . Hypertension Brother     Reviw of Systems:  Reviewed in the HPI.  All other systems are negative.  Physical Exam: BP 140/86   Pulse 67   Ht 5\' 6"  (1.676 m)   Wt 191 lb 12.8 oz (87 kg)   BMI 30.96 kg/m  The patient is alert and oriented x 3.  The mood and affect are normal.   Skin: warm and dry.  Color is normal.    HEENT:   the sclera are nonicteric.  The mucous  membranes are moist.  The carotids are 2+ without bruits.  There is no thyromegaly.  There is no JVD.    Lungs: clear.  The chest wall is non tender.    Heart: regular rate with a normal S1 and S2.  There are no murmurs, gallops, or rubs. The PMI is not displaced.     Abdomen: good bowel sounds.  There is no guarding or rebound.  There is no hepatosplenomegaly or tenderness.  There are no masses.   Extremities:  no clubbing, cyanosis, or edema.  The legs are without rashes.  The distal pulses are intact.   Neuro:  Cranial nerves II - XII are intact.  Motor and sensory functions are intact.    The gait is normal.  ECG: November 05, 2015: Sinus brady at 42 with sinus arrhythmia.   1st degree AV block   Assessment / Plan:   1. Hypertension  - his blood pressure is well-controlled. Continue current medications. Still occasionally eats salty foods  Advised him to watch his salt  2. Hyperlipidemia - we will check fasting lipids today.   3. Paroxysmal atrial fibrillation  - has occasional episodes of A-fib  - on Xarelto. HR is slow.  Is not on any rate slowing agents.   has SSS. No indication for pacer at this point.  Will see him in 6 months to re-evaluate.   4. Gout:      61, MD  06/20/2016 10:56 AM    [facility] [address] [place],  [address] [place], [place]  [place] Pager [phone] Phone: [phone]; Fax: [phone]   [facility]  [address] [place], [place]  [place] [phone]    Fax [phone]

## 2016-06-20 NOTE — Patient Instructions (Signed)
Medication Instructions:  Your physician recommends that you continue on your current medications as directed. Please refer to the Current Medication list given to you today.   Labwork: TODAY - complete metabolic panel, cholesterol   Testing/Procedures: None Ordered   Follow-Up: Your physician wants you to follow-up in: 6 months with Dr. Nahser.  You will receive a reminder letter in the mail two months in advance. If you don't receive a letter, please call our office to schedule the follow-up appointment.   If you need a refill on your cardiac medications before your next appointment, please call your pharmacy.   Thank you for choosing [facility]! Delyle Weider, RN [phone]    

## 2016-09-03 ENCOUNTER — Other Ambulatory Visit: Payer: Self-pay | Admitting: Cardiovascular Disease

## 2016-12-08 ENCOUNTER — Encounter: Payer: Self-pay | Admitting: Cardiovascular Disease

## 2016-12-25 ENCOUNTER — Ambulatory Visit (INDEPENDENT_AMBULATORY_CARE_PROVIDER_SITE_OTHER): Payer: Medicare Other | Admitting: Cardiovascular Disease

## 2016-12-25 ENCOUNTER — Encounter: Payer: Self-pay | Admitting: Cardiovascular Disease

## 2016-12-25 ENCOUNTER — Encounter (INDEPENDENT_AMBULATORY_CARE_PROVIDER_SITE_OTHER): Payer: Self-pay

## 2016-12-25 VITALS — BP 120/64 | HR 69 | Ht 66.0 in | Wt 192.6 lb

## 2016-12-25 DIAGNOSIS — I480 Paroxysmal atrial fibrillation: Secondary | ICD-10-CM | POA: Diagnosis not present

## 2016-12-25 DIAGNOSIS — I10 Essential (primary) hypertension: Secondary | ICD-10-CM

## 2016-12-25 NOTE — Patient Instructions (Signed)
Medication Instructions:  Your physician recommends that you continue on your current medications as directed. Please refer to the Current Medication list given to you today.   Labwork: None Ordered   Testing/Procedures: None Ordered   Follow-Up: Your physician wants you to follow-up in: 1 year with Dr. Nahser.  You will receive a reminder letter in the mail two months in advance. If you don't receive a letter, please call our office to schedule the follow-up appointment.   If you need a refill on your cardiac medications before your next appointment, please call your pharmacy.   Thank you for choosing [facility]! Icie Kuznicki, RN [phone]    

## 2016-12-25 NOTE — Progress Notes (Signed)
Mario Fisher Date of Birth  1934/06/25 [facility] [address]    [address] [place], [place]  [address] [phone]  Fax  [phone]  Problem list: 1. Hypertension 2. Hyperlipidemia 3. Paroxysmal atrial fibrillation  - has occasional episodes of A-fib  4. Gout  History of Present Illness:  83 year old gentleman with a history of hypertension and hyperlipidemia. He has intermittent atrial fibrillation.   He complains of back pain and knee pain. He has been found to have sick sinus syndrome on event monitor.  Jan 28, 2013: We have treated him with Pradaxa since diagnosing him with SSS.  He has paroxysmal A-Fib. He is feeling well.  He is not planting a garden this year.  The wildlife eats all of his plants.    January 30, 2014:  Mario Fisher is doing well.  No CP.  No dyspnea.  No syncope.  February 27, 2015:  Staying active.  No CP or dyspnea  Able to do all of his chores without any problems   November 05, 2015:  Doing well from a cardiac standpoint.  Think the bags under his eyes are related to fluid build up His eye doctor thought that his legs were swollen   HR has been slow.  No syncope or presyncope  Oct. 20, 2017:  Overall doing well Records his BP  - quite variable . Has occasional headaches. He he still eats some salty foods on occasion.  December 25, 2016:  Mario Fisher is seen today for follow up of her PAF and HTN  BP  Had labs with Dr. Clarene Duke  - labs look ok Is doing better with his salt intake   Current Outpatient Prescriptions  Medication Sig Dispense Refill  . Acetaminophen (TYLENOL PO) Take 1 tablet by mouth as needed (for pain).     Marland Kitchen allopurinol (ZYLOPRIM) 100 MG tablet Take 100 mg by mouth 2 (two) times daily.      . Cholecalciferol (VITAMIN D-3) 5000 UNITS TABS Take 1 tablet by mouth daily.    . Cyanocobalamin (VITAMIN B-12 IJ) Inject as directed every 30 (thirty) days.      . dorzolamide (TRUSOPT) 2 % ophthalmic solution Place 1 drop into  both eyes 2 (two) times daily.    . Glucosamine-Chondroit-Vit C-Mn (GLUCOSAMINE CHONDR 1500 COMPLX PO) Take 1,500 mg by mouth daily.     Marland Kitchen loratadine (CLARITIN) 10 MG tablet Take 10 mg by mouth daily.    . Multiple Vitamin (MULTIVITAMIN) tablet Take 1 tablet by mouth daily.      . Omega-3 Fatty Acids (FISH OIL) 1200 MG CAPS Take 2 capsules by mouth in the am and 1 capsule by mouth in the pm    . Polyethyl Glycol-Propyl Glycol (SYSTANE ULTRA) 0.4-0.3 % SOLN Apply 1 drop to eye daily.    . Red Yeast Rice Extract (RED YEAST RICE PO) Take 600 mg by mouth 3 (three) times daily.     . Rivaroxaban (XARELTO) 15 MG TABS tablet Take 1 tablet (15 mg total) by mouth daily with supper. 90 tablet 2  . Tamsulosin HCl (FLOMAX) 0.4 MG CAPS Take 0.4 mg by mouth daily.      . valsartan-hydrochlorothiazide (DIOVAN-HCT) 80-12.5 MG per tablet Take 1 tablet by mouth daily.       No current facility-administered medications for this visit.       No Known Allergies  Past Medical History:  Diagnosis Date  . Atrial fibrillation (HCC)   . BPH (benign  prostatic hypertrophy)   . Chronic anticoagulation    on Pradaxa  . Glucose intolerance (impaired glucose tolerance)   . Hyperlipidemia   . Hypertension   . Stroke [facility])    remote CVA noted on MRI    Past Surgical History:  Procedure Laterality Date  . CHOLECYSTECTOMY    . US ECHOCARDIOGRAPHY  02/25/2005   ef 55-60%    History  Smoking Status  . Former Smoker  . Quit date: 09/01/1968  Smokeless Tobacco  . Never Used    History  Alcohol Use No    Family History  Problem Relation Age of Onset  . Arrhythmia Mother   . Heart attack Father     x2  . Hypertension Father   . Stroke Father   . Heart disease Sister   . Hypertension Brother     Reviw of Systems:  Reviewed in the HPI.  All other systems are negative.  Physical Exam: BP 120/64 (BP Location: Left Arm, Patient Position: Sitting, Cuff Size: Normal)   Pulse 69   Ht 5\' 6"  (1.676 m)    Wt 192 lb 9.6 oz (87.4 kg)   SpO2 95%   BMI 31.09 kg/m  The patient is alert and oriented x 3.  The mood and affect are normal.   Skin: warm and dry.  Color is normal.    HEENT:   the sclera are nonicteric.  The mucous membranes are moist.  The carotids are 2+ without bruits.  There is no thyromegaly.  There is no JVD.    Lungs: clear.  The chest wall is non tender.    Heart: regular rate with a normal S1 and S2.  There are no murmurs, gallops, or rubs. The PMI is not displaced.     Abdomen: good bowel sounds.  There is no guarding or rebound.  There is no hepatosplenomegaly or tenderness.  There are no masses.   Extremities:  no clubbing, cyanosis, or edema.  The legs are without rashes.  The distal pulses are intact.   Neuro:  Cranial nerves II - XII are intact.  Motor and sensory functions are intact.    The gait is normal.  ECG:  NSR at 69.  1st degree AV block , Inc. RBBB    Assessment / Plan:   1. Hypertension  - his blood pressure is well-controlled. Continue current medications.  2. Hyperlipidemia - has labs from his primary MD   3. Paroxysmal atrial fibrillation  - has occasional episodes of A-fib  - on Xarelto. HR is slow.  Is not on any rate slowing agents.   has SSS. No indication for pacer at this point.  Will see him in 1 year .   4. Gout:      , MD  12/25/2016 11:21 AM    [facility] [address] [place],  [address] [place], [place]  [place] Pager [phone] Phone: [phone]; Fax: [phone]

## 2017-04-03 ENCOUNTER — Telehealth: Payer: Self-pay | Admitting: Cardiovascular Disease

## 2017-04-03 NOTE — Telephone Encounter (Signed)
Reviewed chart, patient has sick sinus syndrome, PAF.  Called patient.  He reports that since 7/26 he has noticed his HR lower than normal, consistently.  High 40s-50s, and irregular.  Today BP 131/81, HR 45, irregular.  He feels fatigued.  This is worse with activity, like walking, and he becomes SOB, "gives out".    Reviewed with Gypsy Balsam, NP.    Added patient to Dr. Jenel Lucks schedule on Monday (DOD).   ER precautions given to patient for in the meantime.  Advised him also that as long as HR stays low he should try to relax this weekend, minimize exerting himself while HR is slow.  Pt verbalizes understanding and appreciation.

## 2017-04-03 NOTE — Telephone Encounter (Signed)
New Message  Pt wife called requesting to speak with RN. Pt wife states pt has not ben feeling good since 7/26. She states he brought some of his bp readings into the office on 8/1. Pt wife would like further instructions going forward. Please call back to discuss

## 2017-04-06 ENCOUNTER — Ambulatory Visit (INDEPENDENT_AMBULATORY_CARE_PROVIDER_SITE_OTHER): Payer: Medicare Other | Admitting: Internal Medicine

## 2017-04-06 ENCOUNTER — Encounter: Payer: Self-pay | Admitting: Internal Medicine

## 2017-04-06 VITALS — BP 134/82 | HR 48 | Ht 66.0 in | Wt 186.2 lb

## 2017-04-06 DIAGNOSIS — I10 Essential (primary) hypertension: Secondary | ICD-10-CM | POA: Diagnosis not present

## 2017-04-06 DIAGNOSIS — R001 Bradycardia, unspecified: Secondary | ICD-10-CM

## 2017-04-06 DIAGNOSIS — I480 Paroxysmal atrial fibrillation: Secondary | ICD-10-CM | POA: Diagnosis not present

## 2017-04-06 NOTE — Patient Instructions (Addendum)
Medication Instructions:  Your physician recommends that you continue on your current medications as directed. Please refer to the Current Medication list given to you today.    Labwork: Your physician recommends that you return for lab work today: BMP/CBC   Testing/Procedures: Your physician has recommended that you have a pacemaker inserted. A pacemaker is a small device that is placed under the skin of your chest or abdomen to help control abnormal heart rhythms. This device uses electrical pulses to prompt the heart to beat at a normal rate. Pacemakers are used to treat heart rhythms that are too slow. Wire (leads) are attached to the pacemaker that goes into the chambers of you heart. This is done in the hospital and usually requires and overnight stay. Please see the instruction sheet given to you today for more information.---04/08/17  Please arrive at [facility] Entrance of [facility] at 6:30am Do not eat or drink after midnight the night prior to the procedure Do not take any medications the morning of the test Use scrub according to directions  Plan for one night stay Will need someone to drive you home at discharge       Follow-Up: Your physician recommends that you schedule a follow-up appointment in: 10-14 days from 04/08/17 in device clinic for wound check and 3 months from 04/08/17 with Dr Johney Frame   Any Other Special Instructions Will Be Listed Below (If Applicable).     If you need a refill on your cardiac medications before your next appointment, please call your pharmacy.

## 2017-04-06 NOTE — Progress Notes (Signed)
Electrophysiology Office Note   Date:  04/06/2017   ID:  Mario Fisher, DOB December 11, 1933, MRN [id number]  PCP:  Catha Gosselin, MD  Cardiologist:  Dr Elease Hashimoto Primary Electrophysiologist: Hillis Range, MD    Chief Complaint  Patient presents with  . Atrial Fibrillation     History of Present Illness: Mario Fisher is a 83 y.o. male who presents today for electrophysiology evaluation.   The patient has a h/o sinus bradycardia and advanced first degree AV block.  He also has a history of afib which has been reasonably well controlled.  He has been followed closely by Dr Elease Hashimoto.  Discussions regarding pacing have been had, however given prior absence of symptoms, he has been told that pacing would be in the future. He reports that he has been very active and without symptoms.  Unfortunately, he has developed symptoms of bradycardia abruptly over the past week.  He reports extreme fatigue and decreased exercise tolerance.  He has had weakness and dizziness.  He is found to have atypical atrial flutter with bradycardia and heart rates in the 40s.  He is referred by Dr Elease Hashimoto for further evaluation and management.   Today, he denies symptoms of palpitations, chest pain,  orthopnea, PND, lower extremity edema, claudication, presyncope, syncope, bleeding, or neurologic sequela. The patient is tolerating medications without difficulties and is otherwise without complaint today.    Past Medical History:  Diagnosis Date  . Atrial fibrillation (HCC)   . BPH (benign prostatic hypertrophy)   . Chronic anticoagulation    on Pradaxa  . Glucose intolerance (impaired glucose tolerance)   . Hyperlipidemia   . Hypertension   . Stroke [facility])    remote CVA noted on MRI   Past Surgical History:  Procedure Laterality Date  . CHOLECYSTECTOMY    . US ECHOCARDIOGRAPHY  02/25/2005   ef 55-60%     Current Outpatient Prescriptions  Medication Sig Dispense Refill  . Acetaminophen (TYLENOL PO) Take 1  tablet by mouth as needed (for pain).     Marland Kitchen allopurinol (ZYLOPRIM) 100 MG tablet Take 100 mg by mouth 2 (two) times daily.      . Cholecalciferol (VITAMIN D-3) 5000 UNITS TABS Take 1 tablet by mouth daily.    . Cyanocobalamin (VITAMIN B-12 IJ) Inject as directed every 30 (thirty) days.      . dorzolamide (TRUSOPT) 2 % ophthalmic solution Place 1 drop into both eyes 2 (two) times daily.    . Glucosamine-Chondroit-Vit C-Mn (GLUCOSAMINE CHONDR 1500 COMPLX PO) Take 1,500 mg by mouth daily.     Marland Kitchen loratadine (CLARITIN) 10 MG tablet Take 10 mg by mouth daily.    . Multiple Vitamin (MULTIVITAMIN) tablet Take 1 tablet by mouth daily.      . Omega-3 Fatty Acids (FISH OIL) 1200 MG CAPS Take 2 capsules by mouth in the am and 1 capsule by mouth in the pm    . Polyethyl Glycol-Propyl Glycol (SYSTANE ULTRA) 0.4-0.3 % SOLN Apply 1 drop to eye daily.    . Red Yeast Rice Extract (RED YEAST RICE PO) Take 600 mg by mouth 3 (three) times daily.     . Rivaroxaban (XARELTO) 15 MG TABS tablet Take 1 tablet (15 mg total) by mouth daily with supper. 90 tablet 2  . Tamsulosin HCl (FLOMAX) 0.4 MG CAPS Take 0.4 mg by mouth daily.      . valsartan-hydrochlorothiazide (DIOVAN-HCT) 80-12.5 MG per tablet Take 1 tablet by mouth daily.  No current facility-administered medications for this visit.     Allergies:   Patient has no known allergies.   Social History:  The patient  reports that he quit smoking about 48 years ago. He has never used smokeless tobacco. He reports that he does not drink alcohol or use drugs.   Family History:  The patient's  family history includes Arrhythmia in his mother; Heart attack in his father; Heart disease in his sister; Hypertension in his brother and father; Stroke in his father.    ROS:  Please see the history of present illness.   All other systems are personally reviewed and negative.    PHYSICAL EXAM: VS:  BP 134/82   Pulse (!) 48   Ht 5' 6" (1.676 m)   Wt 186 lb 3.2 oz (84.5  kg)   SpO2 98%   BMI 30.05 kg/m  , BMI Body mass index is 30.05 kg/m. GEN: Well nourished, well developed, in no acute distress  HEENT: normal  Neck: no JVD, carotid bruits, or masses Cardiac: bradycardic irregular rhythm Respiratory:  clear to auscultation bilaterally, normal work of breathing GI: soft, nontender, nondistended, + BS MS: no deformity or atrophy  Skin: warm and dry  Neuro:  Strength and sensation are intact Psych: euthymic mood, full affect  EKG:  EKG is ordered today. The ekg ordered today is personally reviewed and shows atypical atrial flutter with slow ventricular response 48 bpm, QRS 92 msec   Recent Labs: 06/20/2016: ALT 20; BUN 18; Creat 1.33; Potassium 4.1; Sodium 137  personally reviewed   Lipid Panel     Component Value Date/Time   CHOL 149 06/20/2016 1117   TRIG 128 06/20/2016 1117   HDL 42 06/20/2016 1117   CHOLHDL 3.5 06/20/2016 1117   VLDL 26 06/20/2016 1117   LDLCALC 81 06/20/2016 1117   personally reviewed   Wt Readings from Last 3 Encounters:  04/06/17 186 lb 3.2 oz (84.5 kg)  12/25/16 192 lb 9.6 oz (87.4 kg)  06/20/16 191 lb 12.8 oz (87 kg)      Other studies personally reviewed: Additional studies/ records that were reviewed today include: Dr Nahser's notes, prior ekgs, prior echo  Review of the above records today demonstrates: as above   ASSESSMENT AND PLAN:  1.  symptomatic bradycardia The patient has symptomatic bradycardia.  No reversible causes are found.  I would therefore recommend pacemaker implantation at this time.  Risks, benefits, alternatives to pacemaker implantation were discussed in detail with the patient today. The patient understands that the risks include but are not limited to bleeding, infection, pneumothorax, perforation, tamponade, vascular damage, renal failure, MI, stroke, death,  and lead dislodgement and wishes to proceed. We will therefore schedule the procedure at the next available time.  No driving  in the interim  2. Atypical atrial flutter/ atrial fibrillation On xarelto for chads2vasc score of at least 5. CrCl is 55 based on labs from 1/18.  He is only on xarelto 15mg daily Will update BMET.  If CrCl is <50, would probably be ok to proceed with cardioversion at time of PPM implant.  If however CrCl > 50, I would not advise cardioversion until he is on the therapeutic dose for 4 weeks.  3. HTN Stable No change required today  Current medicines are reviewed at length with the patient today.   The patient does not have concerns regarding his medicines.  The following changes were made today:  none  Labs/ tests ordered today include:    Orders Placed This Encounter  Procedures  . Basic metabolic panel  . CBC with Differential  . EKG 12-Lead     Signed, Hillis Range, MD  04/06/2017 5:08 PM     [facility] [address] [place] [address] [phone] (office) [phone] (fax)

## 2017-04-07 LAB — BASIC METABOLIC PANEL
BUN / CREAT RATIO: 12 (ref 10–24)
BUN: 17 mg/dL (ref 8–27)
CHLORIDE: 103 mmol/L (ref 96–106)
CO2: 25 mmol/L (ref 20–29)
Calcium: 9.6 mg/dL (ref 8.6–10.2)
Creatinine, Ser: 1.43 mg/dL — ABNORMAL HIGH (ref 0.76–1.27)
GFR calc Af Amer: 53 mL/min/{1.73_m2} — ABNORMAL LOW (ref >59)
GFR calc non Af Amer: 46 mL/min/{1.73_m2} — ABNORMAL LOW (ref >59)
GLUCOSE: 98 mg/dL (ref 65–99)
Potassium: 3.9 mmol/L (ref 3.5–5.2)
Sodium: 141 mmol/L (ref 134–144)

## 2017-04-07 LAB — CBC WITH DIFFERENTIAL/PLATELET
BASOS ABS: 0 10*3/uL (ref 0.0–0.2)
Basos: 0 %
EOS (ABSOLUTE): 0.1 10*3/uL (ref 0.0–0.4)
Eos: 1 %
Hematocrit: 41.4 % (ref 37.5–51.0)
Hemoglobin: 14.3 g/dL (ref 13.0–17.7)
Immature Grans (Abs): 0 10*3/uL (ref 0.0–0.1)
Immature Granulocytes: 0 %
LYMPHS ABS: 1.8 10*3/uL (ref 0.7–3.1)
Lymphs: 27 %
MCH: 32.6 pg (ref 26.6–33.0)
MCHC: 34.5 g/dL (ref 31.5–35.7)
MCV: 94 fL (ref 79–97)
MONOCYTES: 8 %
MONOS ABS: 0.5 10*3/uL (ref 0.1–0.9)
Neutrophils Absolute: 4.4 10*3/uL (ref 1.4–7.0)
Neutrophils: 64 %
Platelets: 228 10*3/uL (ref 150–379)
RBC: 4.39 x10E6/uL (ref 4.14–5.80)
RDW: 15.4 % (ref 12.3–15.4)
WBC: 6.8 10*3/uL (ref 3.4–10.8)

## 2017-04-08 ENCOUNTER — Encounter (HOSPITAL_COMMUNITY): Payer: Self-pay | Admitting: General Practice

## 2017-04-08 ENCOUNTER — Encounter (HOSPITAL_COMMUNITY)
Admission: RE | Disposition: A | Discharge status: Home / Self Care | Payer: Self-pay | Source: Ambulatory Visit | Attending: Internal Medicine

## 2017-04-08 ENCOUNTER — Ambulatory Visit (HOSPITAL_COMMUNITY)
Admission: RE | Admit: 2017-04-08 | Discharge: 2017-04-09 | Disposition: A | Discharge status: Home / Self Care | Payer: Medicare Other | Source: Ambulatory Visit | Attending: Internal Medicine | Admitting: Internal Medicine

## 2017-04-08 DIAGNOSIS — I495 Sick sinus syndrome: Secondary | ICD-10-CM | POA: Diagnosis present

## 2017-04-08 DIAGNOSIS — I484 Atypical atrial flutter: Secondary | ICD-10-CM | POA: Diagnosis not present

## 2017-04-08 DIAGNOSIS — Z87891 Personal history of nicotine dependence: Secondary | ICD-10-CM | POA: Insufficient documentation

## 2017-04-08 DIAGNOSIS — I480 Paroxysmal atrial fibrillation: Secondary | ICD-10-CM | POA: Diagnosis not present

## 2017-04-08 DIAGNOSIS — E785 Hyperlipidemia, unspecified: Secondary | ICD-10-CM | POA: Diagnosis not present

## 2017-04-08 DIAGNOSIS — N400 Enlarged prostate without lower urinary tract symptoms: Secondary | ICD-10-CM | POA: Insufficient documentation

## 2017-04-08 DIAGNOSIS — Z7901 Long term (current) use of anticoagulants: Secondary | ICD-10-CM | POA: Insufficient documentation

## 2017-04-08 DIAGNOSIS — I10 Essential (primary) hypertension: Secondary | ICD-10-CM | POA: Diagnosis not present

## 2017-04-08 DIAGNOSIS — E7439 Other disorders of intestinal carbohydrate absorption: Secondary | ICD-10-CM | POA: Diagnosis not present

## 2017-04-08 DIAGNOSIS — Z959 Presence of cardiac and vascular implant and graft, unspecified: Secondary | ICD-10-CM

## 2017-04-08 DIAGNOSIS — Z8673 Personal history of transient ischemic attack (TIA), and cerebral infarction without residual deficits: Secondary | ICD-10-CM | POA: Insufficient documentation

## 2017-04-08 DIAGNOSIS — Z8249 Family history of ischemic heart disease and other diseases of the circulatory system: Secondary | ICD-10-CM | POA: Diagnosis not present

## 2017-04-08 HISTORY — DX: Dependence on other enabling machines and devices: Z99.89

## 2017-04-08 HISTORY — DX: Personal history of other diseases of the digestive system: Z87.19

## 2017-04-08 HISTORY — DX: Unspecified glaucoma: H40.9

## 2017-04-08 HISTORY — PX: PACEMAKER IMPLANT: EP1218

## 2017-04-08 HISTORY — DX: Presence of cardiac pacemaker: Z95.0

## 2017-04-08 HISTORY — DX: Low back pain: M54.5

## 2017-04-08 HISTORY — DX: Personal history of other medical treatment: Z92.89

## 2017-04-08 HISTORY — DX: Personal history of other diseases of the musculoskeletal system and connective tissue: Z87.39

## 2017-04-08 HISTORY — DX: Unspecified osteoarthritis, unspecified site: M19.90

## 2017-04-08 HISTORY — PX: CARDIOVERSION: SHX1299

## 2017-04-08 HISTORY — DX: Low back pain, unspecified: M54.50

## 2017-04-08 HISTORY — DX: Obstructive sleep apnea (adult) (pediatric): G47.33

## 2017-04-08 HISTORY — DX: Vitamin B12 deficiency anemia, unspecified: D51.9

## 2017-04-08 HISTORY — DX: Other chronic pain: G89.29

## 2017-04-08 LAB — SURGICAL PCR SCREEN
MRSA, PCR: NEGATIVE
STAPHYLOCOCCUS AUREUS: NEGATIVE

## 2017-04-08 SURGERY — PACEMAKER IMPLANT

## 2017-04-08 MED ORDER — FENTANYL CITRATE (PF) 100 MCG/2ML IJ SOLN
INTRAMUSCULAR | Status: AC
  Filled 2017-04-08: qty 2

## 2017-04-08 MED ORDER — SODIUM CHLORIDE 0.9 % IR SOLN
Status: AC
  Filled 2017-04-08: qty 2

## 2017-04-08 MED ORDER — ACETAMINOPHEN 325 MG PO TABS
325.0000 mg | ORAL_TABLET | ORAL | Status: DC | PRN
  Filled 2017-04-08: qty 2

## 2017-04-08 MED ORDER — SODIUM CHLORIDE 0.9% FLUSH
3.0000 mL | Freq: Two times a day (BID) | INTRAVENOUS | Status: DC
  Administered 2017-04-08 – 2017-04-09 (×3): 3 mL via INTRAVENOUS

## 2017-04-08 MED ORDER — ONDANSETRON HCL 4 MG/2ML IJ SOLN: 4.0000 mg | Freq: Four times a day (QID) | INTRAMUSCULAR | Status: DC | PRN

## 2017-04-08 MED ORDER — TAMSULOSIN HCL 0.4 MG PO CAPS
0.4000 mg | ORAL_CAPSULE | Freq: Every day | ORAL | Status: DC
  Administered 2017-04-08 – 2017-04-09 (×2): 0.4 mg via ORAL
  Filled 2017-04-08 (×2): qty 1

## 2017-04-08 MED ORDER — CHLORHEXIDINE GLUCONATE 4 % EX LIQD
60.0000 mL | Freq: Once | CUTANEOUS | Status: DC
  Filled 2017-04-08: qty 60

## 2017-04-08 MED ORDER — CEFAZOLIN SODIUM-DEXTROSE 2-3 GM-% IV SOLR: INTRAVENOUS | Status: DC | PRN

## 2017-04-08 MED ORDER — MIDAZOLAM HCL 5 MG/5ML IJ SOLN
INTRAMUSCULAR | Status: AC
  Filled 2017-04-08: qty 5

## 2017-04-08 MED ORDER — LIDOCAINE HCL (PF) 1 % IJ SOLN
INTRAMUSCULAR | Status: DC | PRN
  Administered 2017-04-08: 40 mL

## 2017-04-08 MED ORDER — RIVAROXABAN 15 MG PO TABS
15.0000 mg | ORAL_TABLET | Freq: Every day | ORAL | Status: DC
  Administered 2017-04-08: 15 mg via ORAL
  Filled 2017-04-08: qty 1

## 2017-04-08 MED ORDER — HEPARIN (PORCINE) IN NACL 2-0.9 UNIT/ML-% IJ SOLN
INTRAMUSCULAR | Status: AC
  Filled 2017-04-08: qty 1000

## 2017-04-08 MED ORDER — SODIUM CHLORIDE 0.9% FLUSH: 3.0000 mL | INTRAVENOUS | Status: DC | PRN

## 2017-04-08 MED ORDER — FENTANYL CITRATE (PF) 100 MCG/2ML IJ SOLN
INTRAMUSCULAR | Status: DC | PRN
  Administered 2017-04-08: 25 ug via INTRAVENOUS

## 2017-04-08 MED ORDER — HYDROCHLOROTHIAZIDE 12.5 MG PO CAPS
12.5000 mg | ORAL_CAPSULE | Freq: Every day | ORAL | Status: DC
  Administered 2017-04-08 – 2017-04-09 (×2): 12.5 mg via ORAL
  Filled 2017-04-08 (×2): qty 1

## 2017-04-08 MED ORDER — HEPARIN (PORCINE) IN NACL 2-0.9 UNIT/ML-% IJ SOLN
INTRAMUSCULAR | Status: AC | PRN
  Administered 2017-04-08: 500 mL

## 2017-04-08 MED ORDER — HYDROCODONE-ACETAMINOPHEN 5-325 MG PO TABS
1.0000 | ORAL_TABLET | ORAL | Status: DC | PRN
  Administered 2017-04-08 – 2017-04-09 (×2): 2 via ORAL
  Filled 2017-04-08 (×2): qty 2

## 2017-04-08 MED ORDER — CEFAZOLIN SODIUM-DEXTROSE 2-4 GM/100ML-% IV SOLN
INTRAVENOUS | Status: AC
Start: 2017-04-08 — End: 2017-04-08
  Filled 2017-04-08: qty 100

## 2017-04-08 MED ORDER — VALSARTAN-HYDROCHLOROTHIAZIDE 80-12.5 MG PO TABS: 1.0000 | ORAL_TABLET | Freq: Every day | ORAL | Status: DC

## 2017-04-08 MED ORDER — ENSURE ENLIVE PO LIQD: 237.0000 mL | Freq: Two times a day (BID) | ORAL | Status: DC

## 2017-04-08 MED ORDER — CEFAZOLIN SODIUM-DEXTROSE 1-4 GM/50ML-% IV SOLN
1.0000 g | Freq: Four times a day (QID) | INTRAVENOUS | Status: AC
  Administered 2017-04-08 – 2017-04-09 (×2): 1 g via INTRAVENOUS
  Filled 2017-04-08 (×3): qty 50

## 2017-04-08 MED ORDER — CEFAZOLIN SODIUM-DEXTROSE 2-4 GM/100ML-% IV SOLN
2.0000 g | INTRAVENOUS | Status: AC
  Administered 2017-04-08: 2 g via INTRAVENOUS

## 2017-04-08 MED ORDER — BRIMONIDINE TARTRATE 0.2 % OP SOLN
1.0000 [drp] | Freq: Two times a day (BID) | OPHTHALMIC | Status: DC
  Administered 2017-04-08 – 2017-04-09 (×2): 1 [drp] via OPHTHALMIC
  Filled 2017-04-08: qty 5

## 2017-04-08 MED ORDER — SODIUM CHLORIDE 0.9 % IV SOLN: 250.0000 mL | INTRAVENOUS | Status: DC | PRN

## 2017-04-08 MED ORDER — ALLOPURINOL 100 MG PO TABS
100.0000 mg | ORAL_TABLET | Freq: Two times a day (BID) | ORAL | Status: DC
  Administered 2017-04-08 – 2017-04-09 (×2): 100 mg via ORAL
  Filled 2017-04-08 (×2): qty 1

## 2017-04-08 MED ORDER — SODIUM CHLORIDE 0.9 % IV SOLN
INTRAVENOUS | Status: DC
  Administered 2017-04-08: 08:00:00 via INTRAVENOUS

## 2017-04-08 MED ORDER — SODIUM CHLORIDE 0.9 % IR SOLN
80.0000 mg | Status: AC
  Administered 2017-04-08: 80 mg

## 2017-04-08 MED ORDER — MUPIROCIN 2 % EX OINT
TOPICAL_OINTMENT | CUTANEOUS | Status: AC
  Administered 2017-04-08: 08:00:00
  Filled 2017-04-08: qty 22

## 2017-04-08 MED ORDER — IRBESARTAN 75 MG PO TABS
75.0000 mg | ORAL_TABLET | Freq: Every day | ORAL | Status: DC
  Administered 2017-04-08 – 2017-04-09 (×2): 75 mg via ORAL
  Filled 2017-04-08 (×2): qty 1

## 2017-04-08 MED ORDER — MIDAZOLAM HCL 5 MG/5ML IJ SOLN
INTRAMUSCULAR | Status: DC | PRN
  Administered 2017-04-08 (×2): 2 mg via INTRAVENOUS

## 2017-04-08 SURGICAL SUPPLY — 8 items
CABLE SURGICAL S-101-97-12 (CABLE) ×4 IMPLANT
IPG PACE AZUR XT DR MRI W1DR01 (Pacemaker) ×2 IMPLANT
LEAD CAPSURE NOVUS 45CM (Lead) ×4 IMPLANT
LEAD CAPSURE NOVUS 5076-58CM (Lead) ×4 IMPLANT
PACE AZURE XT DR MRI W1DR01 (Pacemaker) ×4 IMPLANT
PAD DEFIB LIFELINK (PAD) ×4 IMPLANT
SHEATH CLASSIC 7F (SHEATH) ×8 IMPLANT
TRAY PACEMAKER INSERTION (PACKS) ×4 IMPLANT

## 2017-04-08 NOTE — Interval H&P Note (Signed)
History and Physical Interval Note:  04/08/2017 8:31 AM  Mario Fisher  has presented today for surgery, with the diagnosis of tachibradie - sss  The various methods of treatment have been discussed with the patient and family. After consideration of risks, benefits and other options for treatment, the patient has consented to  Procedure(s): Pacemaker Implant (N/A) as a surgical intervention .  The patient's history has been reviewed, patient examined, no change in status, stable for surgery.  I have reviewed the patient's chart and labs.  Questions were answered to the patient's satisfaction.    Reports compliance with xarelto without interruption.  CrCl is 47.  We discussed plans for cardioversion if still in afib/ atypical atrial flutter.  He understands risks and wishes to proceed.  Hillis Range

## 2017-04-08 NOTE — H&P (View-Only) (Signed)
Electrophysiology Office Note   Date:  04/06/2017   ID:  [name], DOB December 11, 1933, MRN [id number]  PCP:  Mario Gosselin, MD  Cardiologist:  Dr Elease Hashimoto Primary Electrophysiologist: Hillis Range, MD    Chief Complaint  Patient presents with  . Atrial Fibrillation     History of Present Illness: Mario Fisher is a 83 y.o. male who presents today for electrophysiology evaluation.   The patient has a h/o sinus bradycardia and advanced first degree AV block.  He also has a history of afib which has been reasonably well controlled.  He has been followed closely by Dr Elease Hashimoto.  Discussions regarding pacing have been had, however given prior absence of symptoms, he has been told that pacing would be in the future. He reports that he has been very active and without symptoms.  Unfortunately, he has developed symptoms of bradycardia abruptly over the past week.  He reports extreme fatigue and decreased exercise tolerance.  He has had weakness and dizziness.  He is found to have atypical atrial flutter with bradycardia and heart rates in the 40s.  He is referred by Dr Elease Hashimoto for further evaluation and management.   Today, he denies symptoms of palpitations, chest pain,  orthopnea, PND, lower extremity edema, claudication, presyncope, syncope, bleeding, or neurologic sequela. The patient is tolerating medications without difficulties and is otherwise without complaint today.    Past Medical History:  Diagnosis Date  . Atrial fibrillation (HCC)   . BPH (benign prostatic hypertrophy)   . Chronic anticoagulation    on Pradaxa  . Glucose intolerance (impaired glucose tolerance)   . Hyperlipidemia   . Hypertension   . Stroke [facility])    remote CVA noted on MRI   Past Surgical History:  Procedure Laterality Date  . CHOLECYSTECTOMY    . US ECHOCARDIOGRAPHY  02/25/2005   ef 55-60%     Current Outpatient Prescriptions  Medication Sig Dispense Refill  . Acetaminophen (TYLENOL PO) Take 1  tablet by mouth as needed (for pain).     Marland Kitchen allopurinol (ZYLOPRIM) 100 MG tablet Take 100 mg by mouth 2 (two) times daily.      . Cholecalciferol (VITAMIN D-3) 5000 UNITS TABS Take 1 tablet by mouth daily.    . Cyanocobalamin (VITAMIN B-12 IJ) Inject as directed every 30 (thirty) days.      . dorzolamide (TRUSOPT) 2 % ophthalmic solution Place 1 drop into both eyes 2 (two) times daily.    . Glucosamine-Chondroit-Vit C-Mn (GLUCOSAMINE CHONDR 1500 COMPLX PO) Take 1,500 mg by mouth daily.     Marland Kitchen loratadine (CLARITIN) 10 MG tablet Take 10 mg by mouth daily.    . Multiple Vitamin (MULTIVITAMIN) tablet Take 1 tablet by mouth daily.      . Omega-3 Fatty Acids (FISH OIL) 1200 MG CAPS Take 2 capsules by mouth in the am and 1 capsule by mouth in the pm    . Polyethyl Glycol-Propyl Glycol (SYSTANE ULTRA) 0.4-0.3 % SOLN Apply 1 drop to eye daily.    . Red Yeast Rice Extract (RED YEAST RICE PO) Take 600 mg by mouth 3 (three) times daily.     . Rivaroxaban (XARELTO) 15 MG TABS tablet Take 1 tablet (15 mg total) by mouth daily with supper. 90 tablet 2  . Tamsulosin HCl (FLOMAX) 0.4 MG CAPS Take 0.4 mg by mouth daily.      . valsartan-hydrochlorothiazide (DIOVAN-HCT) 80-12.5 MG per tablet Take 1 tablet by mouth daily.  No current facility-administered medications for this visit.     Allergies:   Patient has no known allergies.   Social History:  The patient  reports that he quit smoking about 48 years ago. He has never used smokeless tobacco. He reports that he does not drink alcohol or use drugs.   Family History:  The patient's  family history includes Arrhythmia in his mother; Heart attack in his father; Heart disease in his sister; Hypertension in his brother and father; Stroke in his father.    ROS:  Please see the history of present illness.   All other systems are personally reviewed and negative.    PHYSICAL EXAM: VS:  BP 134/82   Pulse (!) 48   Ht 5\' 6"  (1.676 m)   Wt 186 lb 3.2 oz (84.5  kg)   SpO2 98%   BMI 30.05 kg/m  , BMI Body mass index is 30.05 kg/m. GEN: Well nourished, well developed, in no acute distress  HEENT: normal  Neck: no JVD, carotid bruits, or masses Cardiac: bradycardic irregular rhythm Respiratory:  clear to auscultation bilaterally, normal work of breathing GI: soft, nontender, nondistended, + BS MS: no deformity or atrophy  Skin: warm and dry  Neuro:  Strength and sensation are intact Psych: euthymic mood, full affect  EKG:  EKG is ordered today. The ekg ordered today is personally reviewed and shows atypical atrial flutter with slow ventricular response 48 bpm, QRS 92 msec   Recent Labs: 06/20/2016: ALT 20; BUN 18; Creat 1.33; Potassium 4.1; Sodium 137  personally reviewed   Lipid Panel     Component Value Date/Time   CHOL 149 06/20/2016 1117   TRIG 128 06/20/2016 1117   HDL 42 06/20/2016 1117   CHOLHDL 3.5 06/20/2016 1117   VLDL 26 06/20/2016 1117   LDLCALC 81 06/20/2016 1117   personally reviewed   Wt Readings from Last 3 Encounters:  04/06/17 186 lb 3.2 oz (84.5 kg)  12/25/16 192 lb 9.6 oz (87.4 kg)  06/20/16 191 lb 12.8 oz (87 kg)      Other studies personally reviewed: Additional studies/ records that were reviewed today include: Dr Harvie Bridge notes, prior ekgs, prior echo  Review of the above records today demonstrates: as above   ASSESSMENT AND PLAN:  1.  symptomatic bradycardia The patient has symptomatic bradycardia.  No reversible causes are found.  I would therefore recommend pacemaker implantation at this time.  Risks, benefits, alternatives to pacemaker implantation were discussed in detail with the patient today. The patient understands that the risks include but are not limited to bleeding, infection, pneumothorax, perforation, tamponade, vascular damage, renal failure, MI, stroke, death,  and lead dislodgement and wishes to proceed. We will therefore schedule the procedure at the next available time.  No driving  in the interim  2. Atypical atrial flutter/ atrial fibrillation On xarelto for chads2vasc score of at least 5. CrCl is 55 based on labs from 1/18.  He is only on xarelto 15mg  daily Will update BMET.  If CrCl is <50, would probably be ok to proceed with cardioversion at time of PPM implant.  If however CrCl > 50, I would not advise cardioversion until he is on the therapeutic dose for 4 weeks.  3. HTN Stable No change required today  Current medicines are reviewed at length with the patient today.   The patient does not have concerns regarding his medicines.  The following changes were made today:  none  Labs/ tests ordered today include:  Orders Placed This Encounter  Procedures  . Basic metabolic panel  . CBC with Differential  . EKG 12-Lead     Signed, Hillis Range, MD  04/06/2017 5:08 PM     [facility] [address] [place] [address] [phone] (office) [phone] (fax)

## 2017-04-09 ENCOUNTER — Ambulatory Visit (HOSPITAL_COMMUNITY): Payer: Medicare Other

## 2017-04-09 ENCOUNTER — Encounter (HOSPITAL_COMMUNITY): Payer: Self-pay | Admitting: Internal Medicine

## 2017-04-09 DIAGNOSIS — I480 Paroxysmal atrial fibrillation: Secondary | ICD-10-CM | POA: Diagnosis not present

## 2017-04-09 DIAGNOSIS — I440 Atrioventricular block, first degree: Secondary | ICD-10-CM | POA: Diagnosis not present

## 2017-04-09 DIAGNOSIS — I495 Sick sinus syndrome: Secondary | ICD-10-CM

## 2017-04-09 DIAGNOSIS — I484 Atypical atrial flutter: Secondary | ICD-10-CM | POA: Diagnosis not present

## 2017-04-09 DIAGNOSIS — I4891 Unspecified atrial fibrillation: Secondary | ICD-10-CM | POA: Diagnosis not present

## 2017-04-09 MED ORDER — LOSARTAN POTASSIUM-HCTZ 50-12.5 MG PO TABS: 1.0000 | ORAL_TABLET | Freq: Every day | ORAL | 6 refills | Status: DC

## 2017-04-09 NOTE — Discharge Instructions (Signed)
° ° °  Supplemental Discharge Instructions for  Pacemaker/Defibrillator Patients  Activity No heavy lifting or vigorous activity with your left/right arm for 6 to 8 weeks.  Do not raise your left/right arm above your head for one week.  Gradually raise your affected arm as drawn below.             04/12/17                     04/13/17                     04/14/17                 04/15/17 __  NO DRIVING for  1 week  ; you may begin driving on  05/15/77 .  WOUND CARE - Keep the wound area clean and dry.  Do not get this area wet,no showers until cleared to at your wound check visit   . - The tape/steri-strips on your wound will fall off; do not pull them off.  No bandage is needed on the site.  DO  NOT apply any creams, oils, or ointments to the wound area. - If you notice any drainage or discharge from the wound, any swelling or bruising at the site, or you develop a fever > 101? F after you are discharged home, call the office at once.  Special Instructions - You are still able to use cellular telephones; use the ear opposite the side where you have your pacemaker/defibrillator.  Avoid carrying your cellular phone near your device. - When traveling through airports, show security personnel your identification card to avoid being screened in the metal detectors.  Ask the security personnel to use the hand wand. - Avoid arc welding equipment, MRI testing (magnetic resonance imaging), TENS units (transcutaneous nerve stimulators).  Call the office for questions about other devices. - Avoid electrical appliances that are in poor condition or are not properly grounded. - Microwave ovens are safe to be near or to operate.  Additional information for defibrillator patients should your device go off: - If your device goes off ONCE and you feel fine afterward, notify the device clinic nurses. - If your device goes off ONCE and you do not feel well afterward, call 911. - If your device goes off TWICE,  call 911. - If your device goes off THREE times in one day, call 911.  DO NOT DRIVE YOURSELF OR A FAMILY MEMBER WITH A DEFIBRILLATOR TO THE HOSPITAL--CALL 911.

## 2017-04-09 NOTE — Care Management Note (Signed)
Case Management Note  Patient Details  Name: Mario Fisher MRN: [id number] Date of Birth: 09/15/1933  Subjective/Objective:  Pt is s/p pacemaker for symptomatic bradycardia       Action/Plan:   PTA independent from home with wife.  Pt has been on Xarelto since last 2016 - aware of copay.  No CM needs determined prior to discharge   Expected Discharge Date:  04/09/17               Expected Discharge Plan:  Home/Self Care  In-House Referral:     Discharge planning Services  CM Consult  Post Acute Care Choice:    Choice offered to:     DME Arranged:    DME Agency:     HH Arranged:    HH Agency:     Status of Service:  Completed, signed off  If discussed at [employer] of Stay Meetings, dates discussed:    Additional Comments:  Cherylann Parr, RN 04/09/2017, 11:16 AM

## 2017-04-09 NOTE — Discharge Summary (Signed)
ELECTROPHYSIOLOGY PROCEDURE DISCHARGE SUMMARY    Patient ID: Mario Fisher,  MRN: [id number], DOB/AGE: 1933-10-20 83 y.o.  Admit date: 04/08/2017 Discharge date: 04/09/2017  Primary Care Physician: Catha Gosselin, MD  Primary Cardiologist: Dr. Elease Hashimoto Electrophysiologist: Dr. Johney Frame  Primary Discharge Diagnosis:  1. Symptomatic bradycardia  Secondary Discharge Diagnosis:  1. Paroxysmal Afib/atypical flutter     CHA2DS2Vasc is at least 5, on Xarelto (Calc. Cr Cl this admission is 48, on 15mg ) 2. HTN  No Known Allergies   Procedures This Admission:  1.  Implantation of a MDT dual chamber PPM on 04/08/17 by Dr 06/08/17.  The patient received a Medtronic Azure XT DR MRI SureScan model Johney Frame (serial number [id number]) pacemaker, Medtronic model [id number] (serial number [id number]) right atrial lead and a Medtronic model 5076- 58 (serial number [id number]) right ventricular lead  2. DCCV 04/08/17, Dr. 06/08/17 There were no immediate post procedure complications. 3.  CXR on 04/09/17 demonstrated no pneumothorax status post device implantation.   Brief HPI: Mario Fisher is a 83 y.o. male was referred to electrophysiology in the outpatient setting for consideration of PPM implantation.  Past medical history includes AFib/flutter, HTN, CVA (my imaging, no clinically), symptomatic bradycardia without reversible causes.  Risks, benefits, and alternatives to PPM implantation were reviewed with the patient who wished to proceed.   Hospital Course:  The patient was admitted and underwent implantation of a PPM and DCCV yesterday with details as outlined above.  He was monitored on telemetry overnight which demonstrated SR, intermittent pacing.  Left chest was without hematoma or ecchymosis.  The device was interrogated and found to be functioning normally.  CXR was obtained and demonstrated no pneumothorax status post device implantation.  Wound care, arm mobility, and restrictions were reviewed with  the patient.  The patient was examined by Dr. 94 and considered stable for discharge to home. The patient's pharmacy has replaced his Valsartan/HCTZ already with drug from an unaffected manufacturer, he will continue to follow with his pharmacy.   Physical Exam: Vitals:   04/08/17 1325 04/08/17 1458 04/08/17 2100 04/09/17 0323  BP: (!) 165/95 (!) 152/85 108/61 137/82  Pulse: 75  60 64  Resp: 18 16 16 18   Temp:   98.6 F (37 C) 97.8 F (36.6 C)  TempSrc:   Oral Oral  SpO2: 99% 99% 98% 100%  Weight:    184 lb 9.6 oz (83.7 kg)  Height:        GEN- The patient is well appearing, alert and oriented x 3 today.   HEENT: normocephalic, atraumatic; sclera clear, conjunctiva pink; hearing intact; oropharynx clear; neck supple, no JVP Lungs- CTA b/l, normal work of breathing.  No wheezes, rales, rhonchi Heart- RRR no murmurs, rubs or gallops, PMI not laterally displaced GI- soft, non-tender, non-distended Extremities- no clubbing, cyanosis, or edema MS- no significant deformity or atrophy Skin- warm and dry, no rash or lesion, left chest, has small hematoma, is soft, nontender, minimal ecchymosis Psych- euthymic mood, full affect Neuro- no gross deficits   Labs:   Lab Results  Component Value Date   WBC 6.8 04/06/2017   HGB 14.3 04/06/2017   HCT 41.4 04/06/2017   MCV 94 04/06/2017   PLT 228 04/06/2017     Recent Labs Lab 04/06/17 1654  NA 141  K 3.9  CL 103  CO2 25  BUN 17  CREATININE 1.43*  CALCIUM 9.6  GLUCOSE 98    Discharge Medications:  Allergies as of 04/09/2017  No Known Allergies     Medication List    TAKE these medications   acetaminophen 500 MG tablet Commonly known as:  TYLENOL Take 1,000 mg by mouth every 8 (eight) hours as needed for mild pain or moderate pain.   allopurinol 100 MG tablet Commonly known as:  ZYLOPRIM Take 100 mg by mouth 2 (two) times daily.   brimonidine 0.2 % ophthalmic solution Commonly known as:  ALPHAGAN Place 1 drop  into both eyes 2 (two) times daily.   cyanocobalamin 1000 MCG/ML injection Commonly known as:  (VITAMIN B-12) Inject 1,000 mcg into the muscle every 30 (thirty) days.   Fish Oil 1200 MG Caps Take 2 capsules by mouth in the am and 1 capsule by mouth in the pm   FLOMAX 0.4 MG Caps capsule Generic drug:  tamsulosin Take 0.4 mg by mouth daily.   GLUCOSAMINE CHONDR 1500 COMPLX PO Take 1,500 mg by mouth daily.   loratadine 10 MG tablet Commonly known as:  CLARITIN Take 10 mg by mouth daily.   multivitamin tablet Take 1 tablet by mouth daily.   RED YEAST RICE PO Take 600 mg by mouth 3 (three) times daily.   Rivaroxaban 15 MG Tabs tablet Commonly known as:  XARELTO Take 1 tablet (15 mg total) by mouth daily with supper.   SYSTANE ULTRA 0.4-0.3 % Soln Generic drug:  Polyethyl Glycol-Propyl Glycol Place 1 drop into both eyes 2 (two) times daily.   valsartan-hydrochlorothiazide 80-12.5 MG tablet Commonly known as:  DIOVAN-HCT Take 1 tablet by mouth daily.   Vitamin D 2000 units tablet Take 6,000 Units by mouth daily.   Vitamin D-3 5000 units Tabs Take 1 tablet by mouth daily.       Disposition: Home Discharge Instructions    Diet - low sodium heart healthy    Complete by:  As directed    Diet - low sodium heart healthy    Complete by:  As directed    Increase activity slowly    Complete by:  As directed    Increase activity slowly    Complete by:  As directed      Follow-up Information    [facility] Office Follow up on 04/14/2017.   Specialty:  Cardiology Why:  10:00AM, wound check visit Contact information: [address] [place] [address] [phone]       Hillis Range, MD Follow up on 07/15/2017.   Specialty:  Cardiology Why:  10:00AM Contact information: [address] [place] [address] [phone]           Duration of Discharge Encounter: Greater than 30 minutes including  physician time.  Signed, Francis Dowse, PA-C 04/09/2017 10:29 AM   I have seen, examined the patient, and reviewed the above assessment and plan.  Device interrogation is reviewed and normal.  CXR reveals stable leads, no ptx.  Changes to above are made where necessary.    Co Sign: Hillis Range, MD 04/09/2017 1:56 PM   Hillis Range MD, [facility] 04/09/2017 1:56 PM

## 2017-04-09 NOTE — Progress Notes (Signed)
Pt discharged to home via w/c, condition stable, education for activity progression and wound care completed with pt and family members with verbal understanding returned.  Raymon Mutton RN

## 2017-04-10 ENCOUNTER — Telehealth: Payer: Self-pay | Admitting: Internal Medicine

## 2017-04-10 ENCOUNTER — Ambulatory Visit (INDEPENDENT_AMBULATORY_CARE_PROVIDER_SITE_OTHER): Payer: Self-pay | Admitting: *Deleted

## 2017-04-10 VITALS — Temp 98.7°F

## 2017-04-10 DIAGNOSIS — R001 Bradycardia, unspecified: Secondary | ICD-10-CM

## 2017-04-10 DIAGNOSIS — I480 Paroxysmal atrial fibrillation: Secondary | ICD-10-CM

## 2017-04-10 NOTE — Progress Notes (Signed)
Patient added-on in Device Clinic for reports of new blood on his t-shirt and Steri-strips this morning.  He also reports that he woke up in a hot sweat around 0400.  Site was painful, but pain resolved with acetaminophen.  Patient afebrile at home (97.7 degrees Farenheit).  He has been taking his Xarelto as prescribed.  Upon assessment, moderate hematoma noted over device site, firm to palpation.  Dried blood noted on Steri-strips.  Diffuse ecchymosis noted across site extending toward left axilla.  Oral temperature 98.7 degrees Farenheit (last dose of acetaminophen around 0800).  Dr. Graciela Husbands saw patient, advised applying pressure dressing but continuing Xarelto as patient was recently cardioverted on 04/08/17.  Pressure dressing applied.  Per Dr. Graciela Husbands, plan to return on 04/16/17 for pressure dressing removal.  Patient instructed to monitor for signs/symptoms of infection, including drainage, redness, fever, or chills.  Patient verbalizes understanding of instructions to proceed to ED if he notes these symptoms over the weekend.   Pacemaker interrogated for episodes only.  Automatic impedance, threshold, and sensing tests stable.  No atrial or ventricular high rate episodes noted.  ROV with Device Clinic on 04/16/17 at 3:30pm while Dr. Graciela Husbands in the office.

## 2017-04-10 NOTE — Telephone Encounter (Signed)
New message    Pt wife states that pt is breaking out in sweats and has a fever after implant. She would not go into further detail and states it is confidential. She requests a call back.

## 2017-04-10 NOTE — Patient Instructions (Addendum)
-   Please keep pressure dressing in place until your wound check appointment on Thursday, 04/16/17 at 3:30pm.    - Please keep dressing clean and dry.  If dressing comes off or loosens, please contact the Device Clinic at [phone] or reinforce it with the supplies given to you today.    - Please call the Device Clinic if you have any signs or symptoms of infection, including drainage, redness, fever, or chills.  If you have these symptoms over the weekend, please go to the Emergency Room.

## 2017-04-10 NOTE — Telephone Encounter (Signed)
New blood on t-shirt and Steri-strips this AM, woke up in a hot sweat around 0400.  Has been taking acetaminophen, helps with the pain.  Oral temp 97.7 degrees F.  Site is possibly less swollen than yesterday but firm to the touch per wife.  Site appears red and bruised.  Advised patient to not take any more acetaminophen so that we can accurately assess his temp at an appointment this afternoon.  Plan for Device Clinic appointment at 3pm today.  Patient and wife are appreciative and deny additional questions or concerns at this time.

## 2017-04-14 ENCOUNTER — Ambulatory Visit: Payer: Medicare Other

## 2017-04-16 ENCOUNTER — Telehealth: Payer: Self-pay | Admitting: *Deleted

## 2017-04-16 ENCOUNTER — Ambulatory Visit (INDEPENDENT_AMBULATORY_CARE_PROVIDER_SITE_OTHER): Payer: Self-pay | Admitting: *Deleted

## 2017-04-16 DIAGNOSIS — I480 Paroxysmal atrial fibrillation: Secondary | ICD-10-CM

## 2017-04-16 DIAGNOSIS — R001 Bradycardia, unspecified: Secondary | ICD-10-CM

## 2017-04-16 MED ORDER — CEPHALEXIN 500 MG PO CAPS: 500.0000 mg | ORAL_CAPSULE | Freq: Three times a day (TID) | ORAL | 0 refills | Status: AC

## 2017-04-16 NOTE — Telephone Encounter (Signed)
LMOVM offering earlier appointment today at 3:00pm for wound check.  Gave Device Clinic phone number for questions/concerns.

## 2017-04-16 NOTE — Patient Instructions (Signed)
Call Device Clinic at [phone] if pressure dressing starts to come loose or bleeding or if develop fever or chills. If over the weekend site starts to bleed, develop fever or chills go to the ED. If pressure dressing starts to come off go to the ED over the weekend.  Avoid getting dressing wet.   Bring Home Monitor to Monday Apt.

## 2017-04-17 NOTE — Progress Notes (Signed)
Pressure dressing removed from device site, small-moderate amount of blood noted. Hematoma noted with healing stages of bruising. Small amount of sanguineous drainage noted from incision site. Site assessed by Dr. Graciela Husbands, orders for Keflex 500 Q8hrs and Dr. Graciela Husbands re-applied pressure dressing. Pt to f/u on 8/20 at 2:00pm.

## 2017-04-20 ENCOUNTER — Ambulatory Visit (INDEPENDENT_AMBULATORY_CARE_PROVIDER_SITE_OTHER): Payer: Medicare Other | Admitting: *Deleted

## 2017-04-20 DIAGNOSIS — R001 Bradycardia, unspecified: Secondary | ICD-10-CM

## 2017-04-20 DIAGNOSIS — I480 Paroxysmal atrial fibrillation: Secondary | ICD-10-CM

## 2017-04-20 NOTE — Patient Instructions (Addendum)
HOLD XARELTO until Friday April 25 2017. Call if [phone] if bleeding, fever, chills or if site increases in size.

## 2017-04-21 ENCOUNTER — Telehealth: Payer: Self-pay | Admitting: Cardiology

## 2017-04-21 ENCOUNTER — Ambulatory Visit (INDEPENDENT_AMBULATORY_CARE_PROVIDER_SITE_OTHER): Payer: Self-pay | Admitting: *Deleted

## 2017-04-21 ENCOUNTER — Encounter: Payer: Self-pay | Admitting: Internal Medicine

## 2017-04-21 DIAGNOSIS — Z950 Presence of cardiac pacemaker: Secondary | ICD-10-CM

## 2017-04-21 DIAGNOSIS — R001 Bradycardia, unspecified: Secondary | ICD-10-CM

## 2017-04-21 NOTE — Telephone Encounter (Signed)
Patient wife called with concerns about patient device site. Called routed to Device [employer].

## 2017-04-21 NOTE — Telephone Encounter (Signed)
Dressing placed yesterday is saturated per wife. I offered an appointment today, she has to call around to see who can bring her husband today. Gave direct # to device clinic to call back with time.

## 2017-04-21 NOTE — Progress Notes (Signed)
Mario Fisher presents to the Device Clinic today for PPM wound check. Bandage (steri-strips, gauze, tegaderm) saturated with dark red blood. Dressing removed. Superficial un-approximation on inferior incision area. Photos taken, attached. Dr. Elberta Fortis visualized incision, ok to have daughter and wife re-dress at home as needed. Continue to hold Xarelto. Supplies given to Mario Fisher for PRN dressing changes. ROV with Device Clinic 04/27/17.

## 2017-04-21 NOTE — Telephone Encounter (Signed)
Patient wife called back and stated that patient could be here at 12 PM. Scheduled appt. Pt wife stated that patient could drive if he could put the seat belt under his arm. She asked if that would be ok. I informed pt wife that I could not advise that it would be ok. Pt wife verbalized understanding.

## 2017-04-22 LAB — CUP PACEART INCLINIC DEVICE CHECK
Brady Statistic RA Percent Paced: 12.6 %
Date Time Interrogation Session: 20180822095031
Implantable Lead Implant Date: 20180808
Implantable Lead Location: 753859
Implantable Lead Location: 753860
Implantable Pulse Generator Implant Date: 20180808
Lead Channel Impedance Value: 418 Ohm
Lead Channel Impedance Value: 513 Ohm
Lead Channel Pacing Threshold Pulse Width: 0.4 ms
Lead Channel Sensing Intrinsic Amplitude: 2.3 mV
MDC IDC LEAD IMPLANT DT: 20180808
MDC IDC MSMT LEADCHNL RV PACING THRESHOLD AMPLITUDE: 0.75 V
MDC IDC MSMT LEADCHNL RV SENSING INTR AMPL: 10 mV
MDC IDC STAT BRADY RV PERCENT PACED: 76.1 %

## 2017-04-22 NOTE — Progress Notes (Addendum)
Wound re-check, pressure dressing removed. Moderate amount of dark sanguineous noted on dressing. Right lateral edges of wound superficially un-approximated. Site assessed by Fawn Kirk recommendations per JA to apply steri-strips, hold Xarelto until 8/25, continue antibiotics and ROV 8/27. Pt and pts wife to call if dressing becomes saturated and or develops fever or chills.   Pacemaker check in clinic. RV Threshold, RA&RV sensing, and impedances consistent with implant measurements. Device programmed at 3.5V on for extra safety margin until 3 month visit. Histogram distribution appropriate for patient and level of activity . 82% AT/AF since 04/10/2017. Patient educated about wound care, arm mobility, lifting restrictions. ROV 04/30/17 for wound re-check

## 2017-04-23 ENCOUNTER — Ambulatory Visit: Payer: Medicare Other

## 2017-04-24 ENCOUNTER — Telehealth: Payer: Self-pay | Admitting: *Deleted

## 2017-04-24 ENCOUNTER — Other Ambulatory Visit: Payer: Self-pay | Admitting: Internal Medicine

## 2017-04-24 NOTE — Telephone Encounter (Signed)
Mario Fisher calling to clarify when to have Mario Fisher re-start xarelto. Per note 04/22/17 Dr. Johney Frame initially recommended to hold Xarelto until 04/25/17. They are aware to re-start xarelto Saturday evening. Keflex rx will be completed Sunday afternoon- she is aware that will complete the course of antibiotics and no refills will be needed. She reports that the wound is still draining and the dressing is being changed daily. I confirmed appt for Monday 04/27/17 at 12:30pm.

## 2017-04-24 NOTE — Telephone Encounter (Signed)
Pharmacy requesting a refill on Cephalexin. Would you like to refill this medication? Please advise

## 2017-04-27 ENCOUNTER — Ambulatory Visit (INDEPENDENT_AMBULATORY_CARE_PROVIDER_SITE_OTHER): Payer: Self-pay | Admitting: *Deleted

## 2017-04-27 DIAGNOSIS — I480 Paroxysmal atrial fibrillation: Secondary | ICD-10-CM

## 2017-04-29 NOTE — Telephone Encounter (Signed)
I don't think he needs a refill on antibiotics, but will forward to Dr. Oris Drone since this is Dr. Jenel Lucks patient.

## 2017-04-29 NOTE — Progress Notes (Signed)
Wound re-check in clinic. Tegaderm and gauze removed. Gauze saturated with sanguineous drainage. Patient and wife reported dressing was applied the night before. Steri strips removed at the recommendation of JA. No drainage seen. Pinpoint opening on left lateral incision. Gauze and tegaderm applied. Patient instructed by JA to leave dressing on for three days. ROV with DC to be seen by JA on 9/5 at 11am.

## 2017-04-30 NOTE — Telephone Encounter (Signed)
Should not need refill according to note

## 2017-05-06 ENCOUNTER — Ambulatory Visit: Payer: Medicare Other | Admitting: *Deleted

## 2017-05-06 DIAGNOSIS — I480 Paroxysmal atrial fibrillation: Secondary | ICD-10-CM

## 2017-05-06 NOTE — Progress Notes (Signed)
Patient seen today for wound recheck. Right lateral incision continues to present with pinpoint opening that has sanguinous drainage. Patient reports saturation of dressing twice within past week which required dressing change. Are free of redness and not hot to touch. Patient reports stable temperature at 98.107F. JA assessed and applied steristrips. Gauze and Tegaderm applied overtop. Patient educated on dressing care and s/s of infection. Patient will come back to see JA 9/13 at 9am.

## 2017-05-08 ENCOUNTER — Telehealth: Payer: Self-pay

## 2017-05-08 NOTE — Telephone Encounter (Signed)
Spoke with patient's wife who reports that dressing applied on 9/5 has begun to leak. She attempted to reinforce the dressing on 9/6 but reports that it is leaking again today. I explained that she could remove the original dressing and reapply a new one. I will review with JA when he is in the office this afternoon and call patient back if there are further recommendations. Patient's wife verbalized understanding.

## 2017-05-14 ENCOUNTER — Ambulatory Visit (INDEPENDENT_AMBULATORY_CARE_PROVIDER_SITE_OTHER): Payer: Self-pay | Admitting: *Deleted

## 2017-05-14 DIAGNOSIS — I480 Paroxysmal atrial fibrillation: Secondary | ICD-10-CM

## 2017-05-14 NOTE — Progress Notes (Signed)
Patient seen today for wound re-check. Dressing removed with old blood seen on guaze. Patient reports dressing last changed 9/7. Incision presents today with approximated edges, no drainage seen. Patient denies a fever or chills. JA assessed with recommendations to place 2x2 gauze and paper tape over left lateral corner of incision. Patient to call with any additional issues with drainage. ROV with JA 11/12.

## 2017-06-04 ENCOUNTER — Telehealth: Payer: Self-pay | Admitting: Internal Medicine

## 2017-06-04 NOTE — Telephone Encounter (Signed)
Walk in pt Form-Sealed Envelope dropped off by patient placed in [facility].

## 2017-06-15 ENCOUNTER — Telehealth: Payer: Self-pay

## 2017-06-15 ENCOUNTER — Other Ambulatory Visit: Payer: Self-pay | Admitting: Cardiovascular Disease

## 2017-06-15 NOTE — Telephone Encounter (Signed)
Xarelto refill received,Pt is 83 yrs old, 84.5kg, Crea-1.43 on 04/06/17, last seen by Dr. Johney Frame on 04/06/17, CrCl-48.86ml/min. Refill sent ot requested pharmacy.

## 2017-06-15 NOTE — Telephone Encounter (Signed)
Dr Elease Hashimoto has signed the pt assistance application and the RX for Xarelto. I have faxed all to [employer] at [phone] as requested by the pt.  Also, the pt is aware that I have left him 3 bottles of Xarelto samples in the front office and that he may pick up at his convenience.

## 2017-06-15 NOTE — Telephone Encounter (Addendum)
**Note De-Identified  Obfuscation** Received a lg. envelope in the mail from the pt containing a [employer] pt assistance application (just the provider part) with a note asking that the application be completed, signed by Dr Elease Hashimoto along with a RX for Xarelto and then that all be faxed to [employer] at [phone].  I have placed application and RX in Dr Harvie Bridge mail bin awaiting his signature. He will be back in the office on 10/22.

## 2017-06-26 ENCOUNTER — Emergency Department (HOSPITAL_COMMUNITY): Payer: Medicare Other

## 2017-06-26 ENCOUNTER — Emergency Department (HOSPITAL_COMMUNITY)
Admission: EM | Admit: 2017-06-26 | Discharge: 2017-06-26 | Disposition: A | Discharge status: Home / Self Care | Payer: Medicare Other | Attending: Emergency Medicine | Admitting: Emergency Medicine

## 2017-06-26 ENCOUNTER — Encounter (HOSPITAL_COMMUNITY): Payer: Self-pay | Admitting: Emergency Medicine

## 2017-06-26 DIAGNOSIS — Z950 Presence of cardiac pacemaker: Secondary | ICD-10-CM | POA: Diagnosis not present

## 2017-06-26 DIAGNOSIS — Z87891 Personal history of nicotine dependence: Secondary | ICD-10-CM | POA: Insufficient documentation

## 2017-06-26 DIAGNOSIS — Y999 Unspecified external cause status: Secondary | ICD-10-CM | POA: Insufficient documentation

## 2017-06-26 DIAGNOSIS — I10 Essential (primary) hypertension: Secondary | ICD-10-CM | POA: Diagnosis not present

## 2017-06-26 DIAGNOSIS — Y929 Unspecified place or not applicable: Secondary | ICD-10-CM | POA: Insufficient documentation

## 2017-06-26 DIAGNOSIS — Z79899 Other long term (current) drug therapy: Secondary | ICD-10-CM | POA: Insufficient documentation

## 2017-06-26 DIAGNOSIS — S299XXA Unspecified injury of thorax, initial encounter: Secondary | ICD-10-CM | POA: Diagnosis present

## 2017-06-26 DIAGNOSIS — Y9389 Activity, other specified: Secondary | ICD-10-CM | POA: Diagnosis not present

## 2017-06-26 DIAGNOSIS — S20219A Contusion of unspecified front wall of thorax, initial encounter: Secondary | ICD-10-CM

## 2017-06-26 LAB — CBC
HEMATOCRIT: 43.3 % (ref 39.0–52.0)
Hemoglobin: 15.4 g/dL (ref 13.0–17.0)
MCH: 32.6 pg (ref 26.0–34.0)
MCHC: 35.6 g/dL (ref 30.0–36.0)
MCV: 91.7 fL (ref 78.0–100.0)
PLATELETS: 214 10*3/uL (ref 150–400)
RBC: 4.72 MIL/uL (ref 4.22–5.81)
RDW: 13.9 % (ref 11.5–15.5)
WBC: 4.9 10*3/uL (ref 4.0–10.5)

## 2017-06-26 LAB — BASIC METABOLIC PANEL
Anion gap: 10 (ref 5–15)
BUN: 26 mg/dL — AB (ref 6–20)
CALCIUM: 9.4 mg/dL (ref 8.9–10.3)
CO2: 23 mmol/L (ref 22–32)
CREATININE: 1.56 mg/dL — AB (ref 0.61–1.24)
Chloride: 104 mmol/L (ref 101–111)
GFR calc Af Amer: 46 mL/min — ABNORMAL LOW (ref >60)
GFR, EST NON AFRICAN AMERICAN: 40 mL/min — AB (ref >60)
Glucose, Bld: 103 mg/dL — ABNORMAL HIGH (ref 65–99)
POTASSIUM: 3.9 mmol/L (ref 3.5–5.1)
SODIUM: 137 mmol/L (ref 135–145)

## 2017-06-26 LAB — I-STAT TROPONIN, ED: TROPONIN I, POC: 0.05 ng/mL (ref 0.00–0.08)

## 2017-06-26 MED ORDER — CYCLOBENZAPRINE HCL 10 MG PO TABS: 10.0000 mg | ORAL_TABLET | Freq: Every evening | ORAL | 0 refills | Status: DC | PRN

## 2017-06-26 NOTE — ED Notes (Signed)
Pt ambulated to the bathroom with steady gait.
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
## 2017-06-29 ENCOUNTER — Encounter: Payer: Self-pay | Admitting: *Deleted

## 2017-07-13 ENCOUNTER — Ambulatory Visit: Payer: Medicare Other | Admitting: Internal Medicine

## 2017-07-13 ENCOUNTER — Encounter: Payer: Self-pay | Admitting: Internal Medicine

## 2017-07-13 VITALS — BP 164/100 | HR 76 | Ht 66.0 in | Wt 186.0 lb

## 2017-07-13 DIAGNOSIS — I472 Ventricular tachycardia, unspecified: Secondary | ICD-10-CM

## 2017-07-13 DIAGNOSIS — I480 Paroxysmal atrial fibrillation: Secondary | ICD-10-CM | POA: Diagnosis not present

## 2017-07-13 DIAGNOSIS — R001 Bradycardia, unspecified: Secondary | ICD-10-CM | POA: Diagnosis not present

## 2017-07-13 DIAGNOSIS — I10 Essential (primary) hypertension: Secondary | ICD-10-CM

## 2017-07-13 LAB — CUP PACEART INCLINIC DEVICE CHECK
Brady Statistic AP VP Percent: 6.9 %
Brady Statistic AP VS Percent: 10.35 %
Brady Statistic AS VP Percent: 31.16 %
Brady Statistic AS VS Percent: 51.44 %
Brady Statistic RA Percent Paced: 13 %
Brady Statistic RV Percent Paced: 56.03 %
Implantable Lead Implant Date: 20180808
Implantable Lead Location: 753859
Implantable Lead Model: 5076
Implantable Lead Model: 5076
Lead Channel Impedance Value: 418 Ohm
Lead Channel Pacing Threshold Amplitude: 0.75 V
Lead Channel Sensing Intrinsic Amplitude: 10.75 mV
Lead Channel Sensing Intrinsic Amplitude: 12.625 mV
Lead Channel Sensing Intrinsic Amplitude: 3.875 mV
Lead Channel Setting Pacing Amplitude: 2 V
Lead Channel Setting Pacing Amplitude: 2.5 V
Lead Channel Setting Pacing Pulse Width: 0.4 ms
MDC IDC LEAD IMPLANT DT: 20180808
MDC IDC LEAD LOCATION: 753860
MDC IDC MSMT BATTERY REMAINING LONGEVITY: 149 mo
MDC IDC MSMT BATTERY VOLTAGE: 3.12 V
MDC IDC MSMT LEADCHNL RA IMPEDANCE VALUE: 304 Ohm
MDC IDC MSMT LEADCHNL RA IMPEDANCE VALUE: 399 Ohm
MDC IDC MSMT LEADCHNL RA PACING THRESHOLD AMPLITUDE: 0.75 V
MDC IDC MSMT LEADCHNL RA PACING THRESHOLD PULSEWIDTH: 0.4 ms
MDC IDC MSMT LEADCHNL RA SENSING INTR AMPL: 4.5 mV
MDC IDC MSMT LEADCHNL RV IMPEDANCE VALUE: 513 Ohm
MDC IDC MSMT LEADCHNL RV PACING THRESHOLD PULSEWIDTH: 0.4 ms
MDC IDC PG IMPLANT DT: 20180808
MDC IDC SESS DTM: 20181112165949
MDC IDC SET LEADCHNL RV SENSING SENSITIVITY: 0.9 mV
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
## 2017-07-13 NOTE — Patient Instructions (Addendum)
Medication Instructions:  Your physician recommends that you continue on your current medications as directed. Please refer to the Current Medication list given to you today.  -- If you need a refill on your cardiac medications before your next appointment, please call your pharmacy. --  Labwork: None ordered  Testing/Procedures: None ordered  Follow-Up: Your physician wants you to follow-up in: 3 months  with Dr. Elease Hashimoto and 12 months with Gypsy Balsam NP.   Remote monitoring is used to monitor your Pacemaker from home. This monitoring reduces the number of office visits required to check your device to one time per year. It allows [place] to keep an eye on the functioning of your device to ensure it is working properly. You are scheduled for a device check from home on 10/12/2017. You may send your transmission at any time that day. If you have a wireless device, the transmission will be sent automatically. After your physician reviews your transmission, you will receive a postcard with your next transmission date.    Thank you for choosing [facility]!!   Sigurd Sos, RN [phone]  Any Other Special Instructions Will Be Listed Below (If Applicable).

## 2017-07-15 ENCOUNTER — Encounter: Payer: Medicare Other | Admitting: Internal Medicine

## 2017-07-15 NOTE — Telephone Encounter (Signed)
I called the pt and made him aware that we received a fax from [employer] stating that he has been temporarily approved to receive Xarelto free of charge from [employer] pt assistance foundation until 08/14/2017. Included in the fax was a Medicare Part D Exception Certification that the pt needs to complete and send back to [employer] either by mail or return back to [place] so we can fax it back to [place] [employer] [place] pt assistance foundation within 30 days to be eligible for pt assistance through the end of 2018 calender year. The pt request that I mail this Medicare Part D Exception Certification to him at his address which I have done. Copy of fax is in filling cabinet in the [place] Dept.

## 2017-08-06 ENCOUNTER — Other Ambulatory Visit: Payer: Self-pay | Admitting: Gastroenterology

## 2017-08-06 DIAGNOSIS — R1310 Dysphagia, unspecified: Secondary | ICD-10-CM

## 2017-08-13 ENCOUNTER — Other Ambulatory Visit: Payer: Medicare Other

## 2017-08-17 ENCOUNTER — Ambulatory Visit
Admission: RE | Admit: 2017-08-17 | Discharge: 2017-08-17 | Disposition: A | Discharge status: Home / Self Care | Payer: Medicare Other | Source: Ambulatory Visit | Attending: Gastroenterology | Admitting: Gastroenterology

## 2017-08-17 DIAGNOSIS — R1310 Dysphagia, unspecified: Secondary | ICD-10-CM

## 2017-08-24 ENCOUNTER — Other Ambulatory Visit: Payer: Self-pay | Admitting: Family Medicine

## 2017-08-24 DIAGNOSIS — R079 Chest pain, unspecified: Secondary | ICD-10-CM

## 2017-09-02 ENCOUNTER — Ambulatory Visit
Admission: RE | Admit: 2017-09-02 | Discharge: 2017-09-02 | Disposition: A | Discharge status: Home / Self Care | Payer: Medicare Other | Source: Ambulatory Visit | Attending: Family Medicine | Admitting: Family Medicine

## 2017-09-02 DIAGNOSIS — R079 Chest pain, unspecified: Secondary | ICD-10-CM

## 2017-09-02 MED ORDER — IOPAMIDOL (ISOVUE-300) INJECTION 61%
75.0000 mL | Freq: Once | INTRAVENOUS | Status: AC | PRN
  Administered 2017-09-02: 75 mL via INTRAVENOUS

## 2017-10-12 ENCOUNTER — Ambulatory Visit (INDEPENDENT_AMBULATORY_CARE_PROVIDER_SITE_OTHER): Payer: Medicare Other | Admitting: *Deleted

## 2017-10-12 DIAGNOSIS — R001 Bradycardia, unspecified: Secondary | ICD-10-CM | POA: Diagnosis not present

## 2017-10-12 NOTE — Progress Notes (Signed)
Remote pacemaker transmission.   

## 2017-10-13 ENCOUNTER — Ambulatory Visit: Payer: Medicare Other | Admitting: Cardiovascular Disease

## 2017-10-13 ENCOUNTER — Encounter: Payer: Self-pay | Admitting: Cardiovascular Disease

## 2017-10-13 VITALS — BP 122/70 | HR 84 | Ht 66.0 in | Wt 186.2 lb

## 2017-10-13 DIAGNOSIS — I480 Paroxysmal atrial fibrillation: Secondary | ICD-10-CM

## 2017-10-13 DIAGNOSIS — I10 Essential (primary) hypertension: Secondary | ICD-10-CM

## 2017-10-13 NOTE — Patient Instructions (Signed)
Medication Instructions:  Your physician recommends that you continue on your current medications as directed. Please refer to the Current Medication list given to you today.   Labwork: None Ordered   Testing/Procedures: None Ordered   Follow-Up: Your physician wants you to follow-up in: 1 year with Dr. Nahser.  You will receive a reminder letter in the mail two months in advance. If you don't receive a letter, please call our office to schedule the follow-up appointment.   If you need a refill on your cardiac medications before your next appointment, please call your pharmacy.   Thank you for choosing [facility]! Zahki Hoogendoorn, RN [phone]    
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
## 2017-10-14 ENCOUNTER — Encounter: Payer: Self-pay | Admitting: Cardiology

## 2017-10-16 ENCOUNTER — Encounter: Payer: Self-pay | Admitting: Internal Medicine

## 2017-10-21 LAB — CUP PACEART REMOTE DEVICE CHECK
Battery Voltage: 3.07 V
Brady Statistic AP VP Percent: 11.43 %
Brady Statistic RA Percent Paced: 27.36 %
Brady Statistic RV Percent Paced: 36.9 %
Implantable Lead Implant Date: 20180808
Implantable Lead Location: 753860
Implantable Pulse Generator Implant Date: 20180808
Lead Channel Impedance Value: 380 Ohm
Lead Channel Impedance Value: 456 Ohm
Lead Channel Pacing Threshold Amplitude: 0.625 V
Lead Channel Pacing Threshold Pulse Width: 0.4 ms
Lead Channel Setting Pacing Amplitude: 1.5 V
Lead Channel Setting Sensing Sensitivity: 0.9 mV
MDC IDC LEAD IMPLANT DT: 20180808
MDC IDC LEAD LOCATION: 753859
MDC IDC MSMT BATTERY REMAINING LONGEVITY: 158 mo
MDC IDC MSMT LEADCHNL RA IMPEDANCE VALUE: 285 Ohm
MDC IDC MSMT LEADCHNL RA IMPEDANCE VALUE: 342 Ohm
MDC IDC MSMT LEADCHNL RA SENSING INTR AMPL: 3.125 mV
MDC IDC MSMT LEADCHNL RA SENSING INTR AMPL: 3.125 mV
MDC IDC MSMT LEADCHNL RV PACING THRESHOLD AMPLITUDE: 0.625 V
MDC IDC MSMT LEADCHNL RV PACING THRESHOLD PULSEWIDTH: 0.4 ms
MDC IDC MSMT LEADCHNL RV SENSING INTR AMPL: 10 mV
MDC IDC MSMT LEADCHNL RV SENSING INTR AMPL: 10 mV
MDC IDC SESS DTM: 20190211050335
MDC IDC SET LEADCHNL RV PACING AMPLITUDE: 2 V
MDC IDC SET LEADCHNL RV PACING PULSEWIDTH: 0.4 ms
MDC IDC STAT BRADY AP VS PERCENT: 16.87 %
MDC IDC STAT BRADY AS VP PERCENT: 25.46 %
MDC IDC STAT BRADY AS VS PERCENT: 46.24 %

## 2017-10-23 NOTE — Telephone Encounter (Signed)
Called and spoke w/ pt. I informed me that MD recommends that he only use a chainshaw for long periods of time. Pt verbalized understanding and said he only needs to cut up a few small trees.

## 2018-01-11 ENCOUNTER — Ambulatory Visit (INDEPENDENT_AMBULATORY_CARE_PROVIDER_SITE_OTHER): Payer: Medicare Other | Admitting: *Deleted

## 2018-01-11 DIAGNOSIS — I480 Paroxysmal atrial fibrillation: Secondary | ICD-10-CM

## 2018-01-12 LAB — CUP PACEART REMOTE DEVICE CHECK
Battery Voltage: 3.04 V
Brady Statistic AS VP Percent: 14.6 %
Brady Statistic AS VS Percent: 54.66 %
Brady Statistic RA Percent Paced: 29.1 %
Implantable Lead Implant Date: 20180808
Implantable Lead Implant Date: 20180808
Implantable Lead Model: 5076
Implantable Pulse Generator Implant Date: 20180808
Lead Channel Impedance Value: 285 Ohm
Lead Channel Impedance Value: 418 Ohm
Lead Channel Impedance Value: 475 Ohm
Lead Channel Pacing Threshold Amplitude: 0.625 V
Lead Channel Pacing Threshold Amplitude: 1.25 V
Lead Channel Pacing Threshold Pulse Width: 0.4 ms
Lead Channel Sensing Intrinsic Amplitude: 10.5 mV
Lead Channel Sensing Intrinsic Amplitude: 10.5 mV
Lead Channel Setting Pacing Amplitude: 2.5 V
MDC IDC LEAD LOCATION: 753859
MDC IDC LEAD LOCATION: 753860
MDC IDC MSMT BATTERY REMAINING LONGEVITY: 153 mo
MDC IDC MSMT LEADCHNL RA PACING THRESHOLD PULSEWIDTH: 0.4 ms
MDC IDC MSMT LEADCHNL RA SENSING INTR AMPL: 3.875 mV
MDC IDC MSMT LEADCHNL RA SENSING INTR AMPL: 3.875 mV
MDC IDC MSMT LEADCHNL RV IMPEDANCE VALUE: 475 Ohm
MDC IDC SESS DTM: 20190513130054
MDC IDC SET LEADCHNL RV PACING AMPLITUDE: 2 V
MDC IDC SET LEADCHNL RV PACING PULSEWIDTH: 0.4 ms
MDC IDC SET LEADCHNL RV SENSING SENSITIVITY: 0.9 mV
MDC IDC STAT BRADY AP VP PERCENT: 9.24 %
MDC IDC STAT BRADY AP VS PERCENT: 21.5 %
MDC IDC STAT BRADY RV PERCENT PACED: 24.02 %

## 2018-01-12 NOTE — Progress Notes (Signed)
Remote pacemaker transmission.   

## 2018-01-13 ENCOUNTER — Encounter: Payer: Self-pay | Admitting: Cardiology

## 2018-03-22 ENCOUNTER — Other Ambulatory Visit: Payer: Self-pay | Admitting: Cardiovascular Disease

## 2018-04-12 ENCOUNTER — Ambulatory Visit (INDEPENDENT_AMBULATORY_CARE_PROVIDER_SITE_OTHER): Payer: Medicare Other | Admitting: *Deleted

## 2018-04-12 DIAGNOSIS — I480 Paroxysmal atrial fibrillation: Secondary | ICD-10-CM

## 2018-04-12 NOTE — Progress Notes (Signed)
Remote pacemaker transmission.   

## 2018-04-13 ENCOUNTER — Encounter: Payer: Self-pay | Admitting: Cardiology

## 2018-05-06 LAB — CUP PACEART REMOTE DEVICE CHECK
Battery Remaining Longevity: 155 mo
Brady Statistic AP VP Percent: 9.42 %
Brady Statistic AP VS Percent: 16.58 %
Brady Statistic AS VP Percent: 17.41 %
Brady Statistic AS VS Percent: 56.46 %
Brady Statistic RA Percent Paced: 17.91 %
Implantable Lead Implant Date: 20180808
Implantable Lead Model: 5076
Lead Channel Impedance Value: 266 Ohm
Lead Channel Impedance Value: 399 Ohm
Lead Channel Impedance Value: 456 Ohm
Lead Channel Impedance Value: 532 Ohm
Lead Channel Pacing Threshold Amplitude: 0.75 V
Lead Channel Pacing Threshold Pulse Width: 0.4 ms
Lead Channel Sensing Intrinsic Amplitude: 1.5 mV
Lead Channel Sensing Intrinsic Amplitude: 1.5 mV
Lead Channel Sensing Intrinsic Amplitude: 9.875 mV
Lead Channel Sensing Intrinsic Amplitude: 9.875 mV
Lead Channel Setting Pacing Amplitude: 1.75 V
Lead Channel Setting Pacing Amplitude: 2 V
Lead Channel Setting Pacing Pulse Width: 0.4 ms
Lead Channel Setting Sensing Sensitivity: 0.9 mV
MDC IDC LEAD IMPLANT DT: 20180808
MDC IDC LEAD LOCATION: 753859
MDC IDC LEAD LOCATION: 753860
MDC IDC MSMT BATTERY VOLTAGE: 3.02 V
MDC IDC MSMT LEADCHNL RA PACING THRESHOLD AMPLITUDE: 0.75 V
MDC IDC MSMT LEADCHNL RA PACING THRESHOLD PULSEWIDTH: 0.4 ms
MDC IDC PG IMPLANT DT: 20180808
MDC IDC SESS DTM: 20190812114153
MDC IDC STAT BRADY RV PERCENT PACED: 45.26 %

## 2018-06-11 ENCOUNTER — Other Ambulatory Visit: Payer: Self-pay

## 2018-06-14 ENCOUNTER — Other Ambulatory Visit: Payer: Self-pay | Admitting: Family Medicine

## 2018-06-14 DIAGNOSIS — M542 Cervicalgia: Secondary | ICD-10-CM

## 2018-06-17 ENCOUNTER — Ambulatory Visit
Admission: RE | Admit: 2018-06-17 | Discharge: 2018-06-17 | Disposition: A | Discharge status: Home / Self Care | Payer: Medicare Other | Source: Ambulatory Visit | Attending: Family Medicine | Admitting: Family Medicine

## 2018-06-17 DIAGNOSIS — M542 Cervicalgia: Secondary | ICD-10-CM

## 2018-07-12 ENCOUNTER — Ambulatory Visit (INDEPENDENT_AMBULATORY_CARE_PROVIDER_SITE_OTHER): Payer: Medicare Other | Admitting: *Deleted

## 2018-07-12 DIAGNOSIS — I480 Paroxysmal atrial fibrillation: Secondary | ICD-10-CM

## 2018-07-12 DIAGNOSIS — I495 Sick sinus syndrome: Secondary | ICD-10-CM

## 2018-07-12 NOTE — Progress Notes (Signed)
Remote pacemaker transmission.   

## 2018-07-14 NOTE — Progress Notes (Signed)
Electrophysiology Office Note Date: 07/18/2018  ID:  Mario Fisher, DOB 09/19/1933, MRN [id number]  PCP: Mario Gosselin, MD Primary Cardiologist: Mario Fisher Electrophysiologist: Allred  CC: Pacemaker follow-up  Mario Fisher is a 84 y.o. male seen today for Dr Johney Frame.  He presents today for routine electrophysiology followup.  Since last being seen in our clinic, the patient reports doing relatively well.  He has had some increased fatigue and shortness of breath with exertion since last being seen.  He denies chest pain, palpitations, PND, orthopnea, nausea, vomiting, dizziness, syncope, edema, weight gain, or early satiety.    Past Medical History:  Diagnosis Date  . Arthritis    "back" (04/08/2017)  . Atrial fibrillation (HCC)   . B12 deficiency anemia   . BPH (benign prostatic hypertrophy)   . Chronic anticoagulation    on Pradaxa  . Chronic lower back pain   . Glaucoma, both eyes   . Glucose intolerance (impaired glucose tolerance)   . History of blood transfusion    "I'm thinking they gave me blood when I had the gallbladder OR" (04/08/2017)  . History of gout   . History of hiatal hernia   . Hyperlipidemia   . Hypertension   . OSA on CPAP   . Presence of permanent cardiac pacemaker   . Stroke [facility])    remote CVA noted on MRI; "silent stroke; didn't know when it happened" (04/08/2017)   Past Surgical History:  Procedure Laterality Date  . CARDIOVERSION  04/08/2017   Procedure: Cardioversion;  Surgeon: Mario Range, MD;  Location: [facility];  Service: Cardiovascular;;  . CATARACT EXTRACTION W/ INTRAOCULAR LENS  IMPLANT, BILATERAL Bilateral   . CHOLECYSTECTOMY OPEN    . PACEMAKER IMPLANT N/A 04/08/2017   Medtronic Azure XT dual-chamber MRI conditional pacemaker for symptomatic bradycardia by Dr Johney Frame  . US ECHOCARDIOGRAPHY  02/25/2005   ef 55-60%    Current Outpatient Medications  Medication Sig Dispense Refill  . allopurinol (ZYLOPRIM) 100 MG tablet Take  100 mg by mouth 2 (two) times daily.      . Cholecalciferol (VITAMIN D-3) 5000 UNITS TABS Take 1 tablet by mouth daily.    . cyanocobalamin (,VITAMIN B-12,) 1000 MCG/ML injection Inject 1,000 mcg into the muscle every 30 (thirty) days.    . dorzolamide (TRUSOPT) 2 % ophthalmic solution Place 1 drop into both eyes 2 (two) times daily.  12  . fexofenadine (ALLEGRA) 180 MG tablet Take 180 mg by mouth daily.    . Glucosamine-Chondroit-Vit C-Mn (GLUCOSAMINE CHONDR 1500 COMPLX PO) Take 1,500 mg by mouth daily.     . Multiple Vitamin (MULTIVITAMIN) tablet Take 1 tablet by mouth daily.      . Omega-3 Fatty Acids (FISH OIL) 1200 MG CAPS Take 2 capsules by mouth in the am and 1 capsule by mouth in the pm    . Polyethyl Glycol-Propyl Glycol (SYSTANE ULTRA) 0.4-0.3 % SOLN Place 1 drop into both eyes 2 (two) times daily.     . Tamsulosin HCl (FLOMAX) 0.4 MG CAPS Take 0.4 mg by mouth daily.      . valsartan-hydrochlorothiazide (DIOVAN-HCT) 80-12.5 MG per tablet Take 1 tablet by mouth daily.      Mario Fisher 15 MG TABS tablet TAKE 1 TABLET BY MOUTH ONCE DAILY WITH SUPPER 90 tablet 1   No current facility-administered medications for this visit.     Allergies:   Patient has no known allergies.   Social History: Social History   Socioeconomic History  .  Marital status: Married    Spouse name: Not on file  . Number of children: Not on file  . Years of education: Not on file  . Highest education level: Not on file  Occupational History  . Not on file  Social Needs  . Financial resource strain: Not on file  . Food insecurity:    Worry: Not on file    Inability: Not on file  . Transportation needs:    Medical: Not on file    Non-medical: Not on file  Tobacco Use  . Smoking status: Former Smoker    Years: 0.50    Types: Cigars    Last attempt to quit: 09/01/1968    Years since quitting: 49.9  . Smokeless tobacco: Never Used  Substance and Sexual Activity  . Alcohol use: No  . Drug use: No  .  Sexual activity: Not Currently  Lifestyle  . Physical activity:    Days per week: Not on file    Minutes per session: Not on file  . Stress: Not on file  Relationships  . Social connections:    Talks on phone: Not on file    Gets together: Not on file    Attends religious service: Not on file    Active member of club or organization: Not on file    Attends meetings of clubs or organizations: Not on file    Relationship status: Not on file  . Intimate partner violence:    Fear of current or ex partner: Not on file    Emotionally abused: Not on file    Physically abused: Not on file    Forced sexual activity: Not on file  Other Topics Concern  . Not on file  Social History Narrative  . Not on file    Family History: Family History  Problem Relation Age of Onset  . Arrhythmia Mother   . Heart attack Father        x2  . Hypertension Father   . Stroke Father   . Heart disease Sister   . Hypertension Brother      Review of Systems: All other systems reviewed and are otherwise negative except as noted above.   Physical Exam: VS:  BP 110/72   Pulse 70   Ht 5\' 6"  (1.676 m)   Wt 178 lb (80.7 kg)   SpO2 96%   BMI 28.73 kg/m  , BMI Body mass index is 28.73 kg/m.  GEN- The patient is elderly appearing, alert and oriented x 3 today.   HEENT: normocephalic, atraumatic; sclera clear, conjunctiva pink; hearing intact; oropharynx clear; neck supple  Lungs- Clear to ausculation bilaterally, normal work of breathing.  No wheezes, rales, rhonchi Heart- Regular rate and rhythm (paced) GI- soft, non-tender, non-distended, bowel sounds present  Extremities- no clubbing, cyanosis, or edema  MS- no significant deformity or atrophy Skin- warm and dry, no rash or lesion; PPM pocket well healed Psych- euthymic mood, full affect Neuro- strength and sensation are intact  PPM Interrogation- reviewed in detail today,  See PACEART report   Recent Labs: 07/15/2018: BUN 24; Creatinine,  Ser 1.43; Hemoglobin 15.4; Platelets 303; Potassium 3.9; Sodium 139   Wt Readings from Last 3 Encounters:  07/15/18 178 lb (80.7 kg)  10/13/17 186 lb 3.2 oz (84.5 kg)  07/13/17 186 lb (84.4 kg)     Other studies Reviewed: Additional studies/ records that were reviewed today include: Dr 13/12/18 and Dr Johney Frame office notes  Assessment and Plan:  1.  Symptomatic bradycardia Normal PPM function See Pace Art report No changes today  2.  Persistent atrial fibrillation Recurrent since July with increased shortness of breath with exertion and fatigue I think a cardioversion is reasonable to see if symptoms improve in SR If he has recurrent AF post cardioversion and had symptomatic improvement, amiodarone is our only AAD option with CKD He has been compliant with Xarelto (dosed appropriately based on renal function)  3.  HTN Stable No change required today    Current medicines are reviewed at length with the patient today.   The patient does not have concerns regarding his medicines.  The following changes were made today:  none  Labs/ tests ordered today include:  Orders Placed This Encounter  Procedures  . CBC  . Basic metabolic panel  . CUP PACEART INCLINIC DEVICE CHECK  . EKG 12-Lead     Disposition:   Follow up with me after cardioversion     Signed, Gypsy Balsam, NP 07/18/2018 7:39 AM  [facility] [address] [place] [address] [phone] (office) [phone] (fax)

## 2018-07-15 ENCOUNTER — Ambulatory Visit: Payer: Medicare Other | Admitting: Nurse Practitioner

## 2018-07-15 ENCOUNTER — Encounter: Payer: Self-pay | Admitting: Nurse Practitioner

## 2018-07-15 ENCOUNTER — Encounter: Payer: Self-pay | Admitting: *Deleted

## 2018-07-15 VITALS — BP 110/72 | HR 70 | Ht 66.0 in | Wt 178.0 lb

## 2018-07-15 DIAGNOSIS — I495 Sick sinus syndrome: Secondary | ICD-10-CM | POA: Diagnosis not present

## 2018-07-15 DIAGNOSIS — I10 Essential (primary) hypertension: Secondary | ICD-10-CM | POA: Diagnosis not present

## 2018-07-15 DIAGNOSIS — I4819 Other persistent atrial fibrillation: Secondary | ICD-10-CM | POA: Diagnosis not present

## 2018-07-15 LAB — CUP PACEART INCLINIC DEVICE CHECK
Date Time Interrogation Session: 20191114102539
Implantable Lead Implant Date: 20180808
Implantable Pulse Generator Implant Date: 20180808
MDC IDC LEAD IMPLANT DT: 20180808
MDC IDC LEAD LOCATION: 753859
MDC IDC LEAD LOCATION: 753860

## 2018-07-15 NOTE — Patient Instructions (Addendum)
Medication Instructions:  Your physician recommends that you continue on your current medications as directed. Please refer to the Current Medication list given to you today.  If you need a refill on your cardiac medications before your next appointment, please call your pharmacy.   Lab work: CBC BMET TODAY   If you have labs (blood work) drawn today and your tests are completely normal, you will receive your results only by: Marland Kitchen MyChart Message (if you have MyChart) OR . A paper copy in the mail If you have any lab test that is abnormal or we need to change your treatment, we will call you to review the results.  Testing/Procedures: Your physician has recommended that you have a Cardioversion (DCCV). Electrical Cardioversion uses a jolt of electricity to your heart either through paddles or wired patches attached to your chest. This is a controlled, usually prescheduled, procedure. Defibrillation is done under light anesthesia in the hospital, and you usually go home the day of the procedure. This is done to get your heart back into a normal rhythm. You are not awake for the procedure. Please see the instruction sheet given to you today.   Follow-Up:  IN January WITH SEILER    Any Other Special Instructions Will Be Listed Below (If Applicable).

## 2018-07-16 LAB — BASIC METABOLIC PANEL
BUN/Creatinine Ratio: 17 (ref 10–24)
BUN: 24 mg/dL (ref 8–27)
CALCIUM: 10.4 mg/dL — AB (ref 8.6–10.2)
CO2: 23 mmol/L (ref 20–29)
CREATININE: 1.43 mg/dL — AB (ref 0.76–1.27)
Chloride: 99 mmol/L (ref 96–106)
GFR calc Af Amer: 52 mL/min/{1.73_m2} — ABNORMAL LOW (ref >59)
GFR, EST NON AFRICAN AMERICAN: 45 mL/min/{1.73_m2} — AB (ref >59)
GLUCOSE: 110 mg/dL — AB (ref 65–99)
Potassium: 3.9 mmol/L (ref 3.5–5.2)
Sodium: 139 mmol/L (ref 134–144)

## 2018-07-16 LAB — CBC
HEMOGLOBIN: 15.4 g/dL (ref 13.0–17.7)
Hematocrit: 45.1 % (ref 37.5–51.0)
MCH: 32 pg (ref 26.6–33.0)
MCHC: 34.1 g/dL (ref 31.5–35.7)
MCV: 94 fL (ref 79–97)
Platelets: 303 10*3/uL (ref 150–450)
RBC: 4.82 x10E6/uL (ref 4.14–5.80)
RDW: 13.9 % (ref 12.3–15.4)
WBC: 6.2 10*3/uL (ref 3.4–10.8)

## 2018-07-19 ENCOUNTER — Telehealth: Payer: Self-pay

## 2018-07-19 NOTE — Telephone Encounter (Signed)
Notes recorded by Sigurd Sos, RN on 07/19/2018 at 8:44 AM EST The patient has been notified of the result and verbalized understanding. All questions (if any) were answered. Sigurd Sos, RN 07/19/2018 8:44 AM

## 2018-07-19 NOTE — Telephone Encounter (Signed)
-----   Message from Marily Lente, NP sent at 07/17/2018  4:06 PM EST ----- Please notify patient of stable labs. Thanks!

## 2018-07-26 ENCOUNTER — Other Ambulatory Visit: Payer: Self-pay | Admitting: Nurse Practitioner

## 2018-07-27 ENCOUNTER — Encounter (HOSPITAL_COMMUNITY)
Admission: RE | Disposition: A | Discharge status: Home / Self Care | Payer: Self-pay | Source: Ambulatory Visit | Attending: Cardiology

## 2018-07-27 ENCOUNTER — Ambulatory Visit (HOSPITAL_COMMUNITY): Payer: Medicare Other | Admitting: Anesthesiology

## 2018-07-27 ENCOUNTER — Ambulatory Visit (HOSPITAL_COMMUNITY)
Admission: RE | Admit: 2018-07-27 | Discharge: 2018-07-27 | Disposition: A | Discharge status: Home / Self Care | Payer: Medicare Other | Source: Ambulatory Visit | Attending: Cardiology | Admitting: Cardiology

## 2018-07-27 ENCOUNTER — Encounter (HOSPITAL_COMMUNITY): Payer: Self-pay | Admitting: *Deleted

## 2018-07-27 DIAGNOSIS — Z79899 Other long term (current) drug therapy: Secondary | ICD-10-CM | POA: Diagnosis not present

## 2018-07-27 DIAGNOSIS — Z950 Presence of cardiac pacemaker: Secondary | ICD-10-CM | POA: Diagnosis not present

## 2018-07-27 DIAGNOSIS — I4819 Other persistent atrial fibrillation: Secondary | ICD-10-CM | POA: Insufficient documentation

## 2018-07-27 DIAGNOSIS — Z8673 Personal history of transient ischemic attack (TIA), and cerebral infarction without residual deficits: Secondary | ICD-10-CM | POA: Diagnosis not present

## 2018-07-27 DIAGNOSIS — R001 Bradycardia, unspecified: Secondary | ICD-10-CM | POA: Insufficient documentation

## 2018-07-27 DIAGNOSIS — Z9049 Acquired absence of other specified parts of digestive tract: Secondary | ICD-10-CM | POA: Insufficient documentation

## 2018-07-27 DIAGNOSIS — Z9841 Cataract extraction status, right eye: Secondary | ICD-10-CM | POA: Insufficient documentation

## 2018-07-27 DIAGNOSIS — E785 Hyperlipidemia, unspecified: Secondary | ICD-10-CM | POA: Diagnosis not present

## 2018-07-27 DIAGNOSIS — M109 Gout, unspecified: Secondary | ICD-10-CM | POA: Insufficient documentation

## 2018-07-27 DIAGNOSIS — Z823 Family history of stroke: Secondary | ICD-10-CM | POA: Diagnosis not present

## 2018-07-27 DIAGNOSIS — G4733 Obstructive sleep apnea (adult) (pediatric): Secondary | ICD-10-CM | POA: Diagnosis not present

## 2018-07-27 DIAGNOSIS — H409 Unspecified glaucoma: Secondary | ICD-10-CM | POA: Insufficient documentation

## 2018-07-27 DIAGNOSIS — Z9889 Other specified postprocedural states: Secondary | ICD-10-CM | POA: Diagnosis not present

## 2018-07-27 DIAGNOSIS — R7302 Impaired glucose tolerance (oral): Secondary | ICD-10-CM | POA: Diagnosis not present

## 2018-07-27 DIAGNOSIS — D519 Vitamin B12 deficiency anemia, unspecified: Secondary | ICD-10-CM | POA: Insufficient documentation

## 2018-07-27 DIAGNOSIS — I10 Essential (primary) hypertension: Secondary | ICD-10-CM | POA: Insufficient documentation

## 2018-07-27 DIAGNOSIS — Z87891 Personal history of nicotine dependence: Secondary | ICD-10-CM | POA: Diagnosis not present

## 2018-07-27 DIAGNOSIS — Z7901 Long term (current) use of anticoagulants: Secondary | ICD-10-CM | POA: Diagnosis not present

## 2018-07-27 DIAGNOSIS — Z9842 Cataract extraction status, left eye: Secondary | ICD-10-CM | POA: Diagnosis not present

## 2018-07-27 DIAGNOSIS — M1990 Unspecified osteoarthritis, unspecified site: Secondary | ICD-10-CM | POA: Insufficient documentation

## 2018-07-27 DIAGNOSIS — Z8249 Family history of ischemic heart disease and other diseases of the circulatory system: Secondary | ICD-10-CM | POA: Diagnosis not present

## 2018-07-27 DIAGNOSIS — N400 Enlarged prostate without lower urinary tract symptoms: Secondary | ICD-10-CM | POA: Diagnosis not present

## 2018-07-27 HISTORY — PX: CARDIOVERSION: SHX1299

## 2018-07-27 LAB — POCT I-STAT 4, (NA,K, GLUC, HGB,HCT)
GLUCOSE: 109 mg/dL — AB (ref 70–99)
HEMATOCRIT: 44 % (ref 39.0–52.0)
HEMOGLOBIN: 15 g/dL (ref 13.0–17.0)
POTASSIUM: 3.8 mmol/L (ref 3.5–5.1)
Sodium: 140 mmol/L (ref 135–145)

## 2018-07-27 SURGERY — CARDIOVERSION
Anesthesia: General

## 2018-07-27 MED ORDER — SODIUM CHLORIDE 0.9 % IV SOLN
INTRAVENOUS | Status: DC | PRN
  Administered 2018-07-27: 13:00:00 via INTRAVENOUS

## 2018-07-27 MED ORDER — PROPOFOL 10 MG/ML IV BOLUS
INTRAVENOUS | Status: DC | PRN
  Administered 2018-07-27: 50 mg via INTRAVENOUS

## 2018-07-27 MED ORDER — LIDOCAINE HCL (CARDIAC) PF 100 MG/5ML IV SOSY
PREFILLED_SYRINGE | INTRAVENOUS | Status: DC | PRN
  Administered 2018-07-27: 60 mg via INTRAVENOUS

## 2018-07-27 NOTE — Procedures (Signed)
Electrical Cardioversion Procedure Note Mario Fisher [id number] 30-Aug-1934  Procedure: Electrical Cardioversion Indications:  Atrial Fibrillation  Procedure Details Consent: Risks of procedure as well as the alternatives and risks of each were explained to the (patient/caregiver).  Consent for procedure obtained. Time Out: Verified patient identification, verified procedure, site/side was marked, verified correct patient position, special equipment/implants available, medications/allergies/relevent history reviewed, required imaging and test results available.  Performed  Patient placed on cardiac monitor, pulse oximetry, supplemental oxygen as necessary.  Sedation given: Pt sedated by anesthesia with lidocaine 60 mg and diprovan 50 mg IV. Pacer pads placed anterior and posterior chest.  Cardioverted 1 time(s).  Cardioverted at 120J.  Evaluation Findings: Post procedure EKG shows: Sinus with V pacing Complications: None Patient did tolerate procedure well.   Mario Fisher 07/27/2018, 12:10 PM

## 2018-07-27 NOTE — Interval H&P Note (Signed)
History and Physical Interval Note:  07/27/2018 12:13 PM  Mario Fisher  has presented today for surgery, with the diagnosis of AFIB  The various methods of treatment have been discussed with the patient and family. After consideration of risks, benefits and other options for treatment, the patient has consented to  Procedure(s): CARDIOVERSION (N/A) as a surgical intervention .  The patient's history has been reviewed, patient examined, no change in status, stable for surgery.  I have reviewed the patient's chart and labs.  Questions were answered to the patient's satisfaction.     [name]

## 2018-07-27 NOTE — Discharge Instructions (Signed)
Electrical Cardioversion, Care After °This sheet gives you information about how to care for yourself after your procedure. Your health care provider may also give you more specific instructions. If you have problems or questions, contact your health care provider. °What can I expect after the procedure? °After the procedure, it is common to have: °· Some redness on the skin where the shocks were given. ° °Follow these instructions at home: °· Do not drive for 24 hours if you were given a medicine to help you relax (sedative). °· Take over-the-counter and prescription medicines only as told by your health care provider. °· Ask your health care provider how to check your pulse. Check it often. °· Rest for 48 hours after the procedure or as told by your health care provider. °· Avoid or limit your caffeine use as told by your health care provider. °Contact a health care provider if: °· You feel like your heart is beating too quickly or your pulse is not regular. °· You have a serious muscle cramp that does not go away. °Get help right away if: °· You have discomfort in your chest. °· You are dizzy or you feel faint. °· You have trouble breathing or you are short of breath. °· Your speech is slurred. °· You have trouble moving an arm or leg on one side of your body. °· Your fingers or toes turn cold or blue. °This information is not intended to replace advice given to you by your health care provider. Make sure you discuss any questions you have with your health care provider. °Document Released: 06/08/2013 Document Revised: 03/21/2016 Document Reviewed: 02/22/2016 °Elsevier Interactive Patient Education © 2018 [employer] ° °

## 2018-07-27 NOTE — Anesthesia Procedure Notes (Signed)
Procedure Name: General with mask airway Date/Time: 07/27/2018 12:43 PM Performed by: Achille Rich, MD Pre-anesthesia Checklist: Patient identified, Emergency Drugs available, Suction available, Patient being monitored and Timeout performed Patient Re-evaluated:Patient Re-evaluated prior to induction Oxygen Delivery Method: Ambu bag Preoxygenation: Pre-oxygenation with 100% oxygen Induction Type: IV induction Dental Injury: Teeth and Oropharynx as per pre-operative assessment

## 2018-07-27 NOTE — Transfer of Care (Signed)
Immediate Anesthesia Transfer of Care Note  Patient: Mario Fisher  Procedure(s) Performed: CARDIOVERSION (N/A )  Patient Location: Endoscopy Unit  Anesthesia Type:General  Level of Consciousness: drowsy and patient cooperative  Airway & Oxygen Therapy: Patient Spontanous Breathing  Post-op Assessment: Report given to RN and Post -op Vital signs reviewed and stable  Post vital signs: Reviewed and stable  Last Vitals:  Vitals Value Taken Time  BP    Temp    Pulse    Resp    SpO2      Last Pain:  Vitals:   07/27/18 1211  TempSrc: Oral  PainSc: 0-No pain         Complications: No apparent anesthesia complications

## 2018-07-27 NOTE — Anesthesia Preprocedure Evaluation (Signed)
Anesthesia Evaluation  Patient identified by MRN, date of birth, ID band Patient awake    Reviewed: Allergy & Precautions, H&P , NPO status , Patient's Chart, lab work & pertinent test results  Airway Mallampati: II   Neck ROM: full    Dental   Pulmonary sleep apnea , former smoker,    breath sounds clear to auscultation       Cardiovascular hypertension, + dysrhythmias Atrial Fibrillation + pacemaker  Rhythm:irregular Rate:Normal     Neuro/Psych CVA    GI/Hepatic hiatal hernia,   Endo/Other    Renal/GU      Musculoskeletal  (+) Arthritis ,   Abdominal   Peds  Hematology   Anesthesia Other Findings   Reproductive/Obstetrics                             Anesthesia Physical Anesthesia Plan  ASA: III  Anesthesia Plan: General   Post-op Pain Management:    Induction: Intravenous  PONV Risk Score and Plan: 2 and Propofol infusion and Treatment may vary due to age or medical condition  Airway Management Planned: Mask  Additional Equipment:   Intra-op Plan:   Post-operative Plan:   Informed Consent: I have reviewed the patients History and Physical, chart, labs and discussed the procedure including the risks, benefits and alternatives for the proposed anesthesia with the patient or authorized representative who has indicated his/her understanding and acceptance.     Plan Discussed with: CRNA and Anesthesiologist  Anesthesia Plan Comments:         Anesthesia Quick Evaluation

## 2018-07-27 NOTE — H&P (Signed)
Office Visit   07/15/2018 [facility] [address]    [place] doctor K, NP  Cardiology   Other persistent atrial fibrillation +2 more  Dx   Referred by Catha Gosselin, MD  Reason for Visit   Additional Documentation   Vitals:   BP 110/72   Pulse 70   Ht 5\' 6"  (1.676 m)   Wt 80.7 kg   SpO2 96%   BMI 28.73 kg/m   BSA 1.94 m   Flowsheets:   MEWS Score,   Anthropometrics     Encounter Info:   Billing Info,   History,   Allergies,   Detailed Report     All Notes   Progress Notes by Marily Lente, NP at 07/15/2018 10:20 AM  Author: Marily Lente, NP Author Type: Nurse Practitioner Filed: 07/18/2018 7:43 AM  Note Status: Signed Cosign: Cosign Not Required Encounter Date: 07/15/2018  Editor: Marily Lente, NP (Nurse Practitioner)       Electrophysiology Office Note Date: 07/18/2018  ID:  Mario Fisher, DOB [birth year], MRN [id number]  PCP: Catha Gosselin, MD Primary Cardiologist: Nahser Electrophysiologist: Allred  CC: Pacemaker follow-up  Mario Fisher is a 84 y.o. male seen today for Dr Johney Frame.  He presents today for routine electrophysiology followup.  Since last being seen in our clinic, the patient reports doing relatively well.  He has had some increased fatigue and shortness of breath with exertion since last being seen.  He denies chest pain, palpitations, PND, orthopnea, nausea, vomiting, dizziness, syncope, edema, weight gain, or early satiety.        Past Medical History:  Diagnosis Date  . Arthritis    "back" (04/08/2017)  . Atrial fibrillation (HCC)   . B12 deficiency anemia   . BPH (benign prostatic hypertrophy)   . Chronic anticoagulation    on Pradaxa  . Chronic lower back pain   . Glaucoma, both eyes   . Glucose intolerance (impaired glucose tolerance)   . History of blood transfusion    "I'm thinking they gave me blood when I had the gallbladder OR" (04/08/2017)  . History of gout   . History of hiatal  hernia   . Hyperlipidemia   . Hypertension   . OSA on CPAP   . Presence of permanent cardiac pacemaker   . Stroke [facility])    remote CVA noted on MRI; "silent stroke; didn't know when it happened" (04/08/2017)        Past Surgical History:  Procedure Laterality Date  . CARDIOVERSION  04/08/2017   Procedure: Cardioversion;  Surgeon: Hillis Range, MD;  [facility];  Service: Cardiovascular;;  . CATARACT EXTRACTION W/ INTRAOCULAR LENS  IMPLANT, BILATERAL Bilateral   . CHOLECYSTECTOMY OPEN    . PACEMAKER IMPLANT N/A 04/08/2017   Medtronic Azure XT dual-chamber MRI conditional pacemaker for symptomatic bradycardia by Dr Johney Frame  . US ECHOCARDIOGRAPHY  02/25/2005   ef 55-60%          Current Outpatient Medications  Medication Sig Dispense Refill  . allopurinol (ZYLOPRIM) 100 MG tablet Take 100 mg by mouth 2 (two) times daily.      . Cholecalciferol (VITAMIN D-3) 5000 UNITS TABS Take 1 tablet by mouth daily.    . cyanocobalamin (,VITAMIN B-12,) 1000 MCG/ML injection Inject 1,000 mcg into the muscle every 30 (thirty) days.    . dorzolamide (TRUSOPT) 2 % ophthalmic solution Place 1 drop into both eyes 2 (two) times daily.  12  . fexofenadine (ALLEGRA)  180 MG tablet Take 180 mg by mouth daily.    . Glucosamine-Chondroit-Vit C-Mn (GLUCOSAMINE CHONDR 1500 COMPLX PO) Take 1,500 mg by mouth daily.     . Multiple Vitamin (MULTIVITAMIN) tablet Take 1 tablet by mouth daily.      . Omega-3 Fatty Acids (FISH OIL) 1200 MG CAPS Take 2 capsules by mouth in the am and 1 capsule by mouth in the pm    . Polyethyl Glycol-Propyl Glycol (SYSTANE ULTRA) 0.4-0.3 % SOLN Place 1 drop into both eyes 2 (two) times daily.     . Tamsulosin HCl (FLOMAX) 0.4 MG CAPS Take 0.4 mg by mouth daily.      . valsartan-hydrochlorothiazide (DIOVAN-HCT) 80-12.5 MG per tablet Take 1 tablet by mouth daily.      [name] 15 MG TABS tablet TAKE 1 TABLET BY MOUTH ONCE DAILY WITH SUPPER 90  tablet 1   No current facility-administered medications for this visit.     Allergies:   Patient has no known allergies.   Social History: Social History        Socioeconomic History  . Marital status: Married    Spouse name: Not on file  . Number of children: Not on file  . Years of education: Not on file  . Highest education level: Not on file  Occupational History  . Not on file  Social Needs  . Financial resource strain: Not on file  . Food insecurity:    Worry: Not on file    Inability: Not on file  . Transportation needs:    Medical: Not on file    Non-medical: Not on file  Tobacco Use  . Smoking status: Former Smoker    Years: 0.50    Types: Cigars    Last attempt to quit: 09/01/1968    Years since quitting: 49.9  . Smokeless tobacco: Never Used  Substance and Sexual Activity  . Alcohol use: No  . Drug use: No  . Sexual activity: Not Currently  Lifestyle  . Physical activity:    Days per week: Not on file    Minutes per session: Not on file  . Stress: Not on file  Relationships  . Social connections:    Talks on phone: Not on file    Gets together: Not on file    Attends religious service: Not on file    Active member of club or organization: Not on file    Attends meetings of clubs or organizations: Not on file    Relationship status: Not on file  . Intimate partner violence:    Fear of current or ex partner: Not on file    Emotionally abused: Not on file    Physically abused: Not on file    Forced sexual activity: Not on file  Other Topics Concern  . Not on file  Social History Narrative  . Not on file    Family History:      Family History  Problem Relation Age of Onset  . Arrhythmia Mother   . Heart attack Father        x2  . Hypertension Father   . Stroke Father   . Heart disease Sister   . Hypertension Brother      Review of Systems: All other systems reviewed and are  otherwise negative except as noted above.   Physical Exam: VS:  BP 110/72   Pulse 70   Ht 5\' 6"  (1.676 m)   Wt 178 lb (80.7 kg)  SpO2 96%   BMI 28.73 kg/m  , BMI Body mass index is 28.73 kg/m.  GEN- The patient is elderly appearing, alert and oriented x 3 today.   HEENT: normocephalic, atraumatic; sclera clear, conjunctiva pink; hearing intact; oropharynx clear; neck supple  Lungs- Clear to ausculation bilaterally, normal work of breathing.  No wheezes, rales, rhonchi Heart- Regular rate and rhythm (paced) GI- soft, non-tender, non-distended, bowel sounds present  Extremities- no clubbing, cyanosis, or edema  MS- no significant deformity or atrophy Skin- warm and dry, no rash or lesion; PPM pocket well healed Psych- euthymic mood, full affect Neuro- strength and sensation are intact  PPM Interrogation- reviewed in detail today,  See PACEART report   Recent Labs: 07/15/2018: BUN 24; Creatinine, Ser 1.43; Hemoglobin 15.4; Platelets 303; Potassium 3.9; Sodium 139      Wt Readings from Last 3 Encounters:  07/15/18 178 lb (80.7 kg)  10/13/17 186 lb 3.2 oz (84.5 kg)  07/13/17 186 lb (84.4 kg)     Other studies Reviewed: Additional studies/ records that were reviewed today include: Dr Johney Frame and Dr Harvie Bridge office notes  Assessment and Plan:  1.  Symptomatic bradycardia Normal PPM function See Pace Art report No changes today  2.  Persistent atrial fibrillation Recurrent since July with increased shortness of breath with exertion and fatigue I think a cardioversion is reasonable to see if symptoms improve in SR If he has recurrent AF post cardioversion and had symptomatic improvement, amiodarone is our only AAD option with CKD He has been compliant with Xarelto (dosed appropriately based on renal function)  3.  HTN Stable No change required today    Current medicines are reviewed at length with the patient today.   The patient does not have  concerns regarding his medicines.  The following changes were made today:  none  Labs/ tests ordered today include:     Orders Placed This Encounter  Procedures  . CBC  . Basic metabolic panel  . CUP PACEART INCLINIC DEVICE CHECK  . EKG 12-Lead     Disposition:   Follow up with me after cardioversion     Signed, Gypsy Balsam, NP 07/18/2018 7:39 AM  [facility] [address] [place] [address] [phone] (office) [phone] (fax)      For DCCV; compliant with xeralto; no changes [name]

## 2018-07-28 NOTE — Anesthesia Postprocedure Evaluation (Signed)
Anesthesia Post Note  Patient: Mario Fisher  Procedure(s) Performed: CARDIOVERSION (N/A )     Patient location during evaluation: Endoscopy Anesthesia Type: General Level of consciousness: awake and alert Pain management: pain level controlled Vital Signs Assessment: post-procedure vital signs reviewed and stable Respiratory status: spontaneous breathing, nonlabored ventilation, respiratory function stable and patient connected to nasal cannula oxygen Cardiovascular status: blood pressure returned to baseline and stable Postop Assessment: no apparent nausea or vomiting Anesthetic complications: no    Last Vitals:  Vitals:   07/27/18 1258 07/27/18 1308  BP: 119/76 124/74  Pulse: 67 76  Resp: 15 (!) 22  Temp:    SpO2: 98% 100%    Last Pain:  Vitals:   07/27/18 1308  TempSrc:   PainSc: 0-No pain                 Brizeyda Holtmeyer S

## 2018-09-02 ENCOUNTER — Other Ambulatory Visit: Payer: Self-pay | Admitting: Cardiovascular Disease

## 2018-09-03 LAB — CUP PACEART REMOTE DEVICE CHECK
Battery Remaining Longevity: 149 mo
Brady Statistic AS VP Percent: 0 %
Brady Statistic RA Percent Paced: 0.04 %
Date Time Interrogation Session: 20191111110433
Implantable Lead Implant Date: 20180808
Implantable Lead Location: 753859
Implantable Lead Location: 753860
Implantable Lead Model: 5076
Implantable Pulse Generator Implant Date: 20180808
Lead Channel Pacing Threshold Amplitude: 0.75 V
Lead Channel Pacing Threshold Pulse Width: 0.4 ms
Lead Channel Sensing Intrinsic Amplitude: 0.75 mV
Lead Channel Sensing Intrinsic Amplitude: 0.75 mV
Lead Channel Setting Pacing Amplitude: 2 V
Lead Channel Setting Pacing Pulse Width: 0.4 ms
MDC IDC LEAD IMPLANT DT: 20180808
MDC IDC MSMT BATTERY VOLTAGE: 3.01 V
MDC IDC MSMT LEADCHNL RA IMPEDANCE VALUE: 266 Ohm
MDC IDC MSMT LEADCHNL RA IMPEDANCE VALUE: 437 Ohm
MDC IDC MSMT LEADCHNL RV IMPEDANCE VALUE: 380 Ohm
MDC IDC MSMT LEADCHNL RV IMPEDANCE VALUE: 494 Ohm
MDC IDC MSMT LEADCHNL RV PACING THRESHOLD AMPLITUDE: 0.875 V
MDC IDC MSMT LEADCHNL RV PACING THRESHOLD PULSEWIDTH: 0.4 ms
MDC IDC MSMT LEADCHNL RV SENSING INTR AMPL: 10.625 mV
MDC IDC MSMT LEADCHNL RV SENSING INTR AMPL: 10.625 mV
MDC IDC SET LEADCHNL RA PACING AMPLITUDE: 1.75 V
MDC IDC SET LEADCHNL RV SENSING SENSITIVITY: 0.9 mV
MDC IDC STAT BRADY AP VP PERCENT: 0 %
MDC IDC STAT BRADY AP VS PERCENT: 0 %
MDC IDC STAT BRADY AS VS PERCENT: 0 %
MDC IDC STAT BRADY RV PERCENT PACED: 96.19 %

## 2018-09-28 NOTE — Progress Notes (Signed)
Electrophysiology Office Note Date: 09/29/2018  ID:  Mario Fisher, DOB 05/18/1934, MRN [id number]  PCP: Catha GosselinLittle, Kevin, MD Primary Cardiologist: Nahser Electrophysiologist: Allred  CC: AF follow up  Mario BostonHenry C Fisher is a 85 y.o. male seen today for Dr Johney FrameAllred.  He presents today for routine electrophysiology followup.  Since last being seen in our clinic, the patient reports doing relatively well.  He has not noticed much change in symptoms after cardioversion.  He remains short of breath with activity.  He denies chest pain, palpitations, PND, orthopnea, nausea, vomiting, dizziness, syncope, edema, weight gain, or early satiety.  Past Medical History:  Diagnosis Date  . Arthritis    "back" (04/08/2017)  . Atrial fibrillation (HCC)   . B12 deficiency anemia   . BPH (benign prostatic hypertrophy)   . Chronic anticoagulation    on Pradaxa  . Chronic lower back pain   . Glaucoma, both eyes   . Glucose intolerance (impaired glucose tolerance)   . History of blood transfusion    "I'm thinking they gave me blood when I had the gallbladder OR" (04/08/2017)  . History of gout   . History of hiatal hernia   . Hyperlipidemia   . Hypertension   . OSA on CPAP   . Presence of permanent cardiac pacemaker   . Stroke [facility](HCC)    remote CVA noted on MRI; "silent stroke; didn't know when it happened" (04/08/2017)   Past Surgical History:  Procedure Laterality Date  . CARDIOVERSION  04/08/2017   Procedure: Cardioversion;  Surgeon: Hillis RangeAllred, James, MD;  Location: [facility];  Service: Cardiovascular;;  . CARDIOVERSION N/A 07/27/2018   Procedure: CARDIOVERSION;  Surgeon: Lewayne Buntingrenshaw, Brian S, MD;  [facility];  Service: Cardiovascular;  Laterality: N/A;  . CATARACT EXTRACTION W/ INTRAOCULAR LENS  IMPLANT, BILATERAL Bilateral   . CHOLECYSTECTOMY OPEN    . PACEMAKER IMPLANT N/A 04/08/2017   Medtronic Azure XT dual-chamber MRI conditional pacemaker for symptomatic bradycardia by Dr Johney FrameAllred    . US ECHOCARDIOGRAPHY  02/25/2005   ef 55-60%    Current Outpatient Medications  Medication Sig Dispense Refill  . acetaminophen (TYLENOL) 500 MG tablet Take 1,000 mg by mouth every 6 (six) hours as needed for moderate pain or headache.    . allopurinol (ZYLOPRIM) 100 MG tablet Take 100 mg by mouth 2 (two) times daily.      . Cholecalciferol (VITAMIN D-3) 5000 UNITS TABS Take 5,000 Units by mouth daily.     . cyanocobalamin (,VITAMIN B-12,) 1000 MCG/ML injection Inject 1,000 mcg into the muscle every 30 (thirty) days.    . dorzolamide (TRUSOPT) 2 % ophthalmic solution Place 1 drop into both eyes 2 (two) times daily.  12  . fexofenadine (ALLEGRA) 180 MG tablet Take 180 mg by mouth daily.    . Glucosamine-Chondroit-Vit C-Mn (GLUCOSAMINE CHONDR 1500 COMPLX PO) Take 1 tablet by mouth daily.     . Multiple Vitamins-Minerals (MULTIVITAMIN ADULTS 50+ PO) Take 1 tablet by mouth daily.    . Omega-3 Fatty Acids (FISH OIL) 1200 MG CAPS Take 1,200-2,400 mg by mouth See admin instructions. Take 2400 mg by mouth in the morning and take 1200 mg by mouth at bedtime    . Propylene Glycol (SYSTANE BALANCE) 0.6 % SOLN Place 1 drop into both eyes 2 (two) times daily.    . Tamsulosin HCl (FLOMAX) 0.4 MG CAPS Take 0.4 mg by mouth daily.      . valsartan-hydrochlorothiazide (DIOVAN-HCT) 80-12.5 MG per tablet  Take 1 tablet by mouth daily.      Mario Fisher 15 MG TABS tablet TAKE 1 TABLET BY MOUTH ONCE DAILY WITH  SUPPER 90 tablet 0   No current facility-administered medications for this visit.     Allergies:   Patient has no known allergies.   Social History: Social History   Socioeconomic History  . Marital status: Married    Spouse name: Not on file  . Number of children: Not on file  . Years of education: Not on file  . Highest education level: Not on file  Occupational History  . Not on file  Social Needs  . Financial resource Fisher: Not on file  . Food insecurity:    Worry: Not on file     Inability: Not on file  . Transportation needs:    Medical: Not on file    Non-medical: Not on file  Tobacco Use  . Smoking status: Former Smoker    Years: 0.50    Types: Cigars    Last attempt to quit: 09/01/1968    Years since quitting: 50.1  . Smokeless tobacco: Never Used  Substance and Sexual Activity  . Alcohol use: No  . Drug use: No  . Sexual activity: Not Currently  Lifestyle  . Physical activity:    Days per week: Not on file    Minutes per session: Not on file  . Stress: Not on file  Relationships  . Social connections:    Talks on phone: Not on file    Gets together: Not on file    Attends religious service: Not on file    Active member of club or organization: Not on file    Attends meetings of clubs or organizations: Not on file    Relationship status: Not on file  . Intimate partner violence:    Fear of current or ex partner: Not on file    Emotionally abused: Not on file    Physically abused: Not on file    Forced sexual activity: Not on file  Other Topics Concern  . Not on file  Social History Narrative  . Not on file    Family History: Family History  Problem Relation Age of Onset  . Arrhythmia Mother   . Heart attack Father        x2  . Hypertension Father   . Stroke Father   . Heart disease Sister   . Hypertension Brother     Review of Systems: All other systems reviewed and are otherwise negative except as noted above.   Physical Exam: VS:  BP 120/68   Pulse 63   Ht 5\' 6"  (1.676 m)   Wt 176 lb 12.8 oz (80.2 kg)   SpO2 98%   BMI 28.54 kg/m  , BMI Body mass index is 28.54 kg/m. Wt Readings from Last 3 Encounters:  09/29/18 176 lb 12.8 oz (80.2 kg)  07/15/18 178 lb (80.7 kg)  10/13/17 186 lb 3.2 oz (84.5 kg)    GEN- The patient is elderly appearing, alert and oriented x 3 today.   HEENT: normocephalic, atraumatic; sclera clear, conjunctiva pink; hearing intact; oropharynx clear; neck supple  Lungs- Clear to ausculation  bilaterally, normal work of breathing.  No wheezes, rales, rhonchi Heart- Regular rate and rhythm  GI- soft, non-tender, non-distended, bowel sounds present  Extremities- no clubbing, cyanosis, or edema MS- no significant deformity or atrophy Skin- warm and dry, no rash or lesion  Psych- euthymic mood, full affect Neuro-  strength and sensation are intact   EKG:  EKG is not ordered today.  Recent Labs: 07/15/2018: BUN 24; Creatinine, Ser 1.43; Platelets 303 07/27/2018: Hemoglobin 15.0; Potassium 3.8; Sodium 140    Assessment and Plan:  1.  Symptomatic bradycardia Normal pacemaker function See PaceArt report No changes today  2.  Persistent atrial fibrillation Maintaining SR post DCCV Continue Xarelto  3.  HTN Stable No change required today  4.  Shortness of breath Not improved with restoration of SR Will update echo    Current medicines are reviewed at length with the patient today.   The patient does not have concerns regarding his medicines.  The following changes were made today:  none  Labs/ tests ordered today include: none Orders Placed This Encounter  Procedures  . CUP PACEART INCLINIC DEVICE CHECK  . ECHOCARDIOGRAM COMPLETE     Disposition:   Follow up with [facility], me in 6 months    Signed, Gypsy Balsam, NP 09/29/2018 10:39 AM   [facility] [address] [place] [address] [phone] (office) [phone] (fax)

## 2018-09-29 ENCOUNTER — Encounter: Payer: Self-pay | Admitting: Nurse Practitioner

## 2018-09-29 ENCOUNTER — Ambulatory Visit: Payer: Medicare Other | Admitting: Nurse Practitioner

## 2018-09-29 VITALS — BP 120/68 | HR 63 | Ht 66.0 in | Wt 176.8 lb

## 2018-09-29 DIAGNOSIS — R0602 Shortness of breath: Secondary | ICD-10-CM | POA: Diagnosis not present

## 2018-09-29 DIAGNOSIS — I4819 Other persistent atrial fibrillation: Secondary | ICD-10-CM

## 2018-09-29 DIAGNOSIS — I495 Sick sinus syndrome: Secondary | ICD-10-CM

## 2018-09-29 DIAGNOSIS — I10 Essential (primary) hypertension: Secondary | ICD-10-CM

## 2018-09-29 LAB — CUP PACEART INCLINIC DEVICE CHECK
Date Time Interrogation Session: 20200129100022
Implantable Lead Location: 753859
Implantable Lead Location: 753860
Implantable Lead Model: 5076
MDC IDC LEAD IMPLANT DT: 20180808
MDC IDC LEAD IMPLANT DT: 20180808
MDC IDC PG IMPLANT DT: 20180808

## 2018-09-29 NOTE — Patient Instructions (Signed)
Medication Instructions:  none If you need a refill on your cardiac medications before your next appointment, please call your pharmacy.   Lab work: none If you have labs (blood work) drawn today and your tests are completely normal, you will receive your results only by: Marland Kitchen MyChart Message (if you have MyChart) OR . A paper copy in the mail If you have any lab test that is abnormal or we need to change your treatment, we will call you to review the results.  Testing/Procedures: Your physician has requested that you have an echocardiogram. Echocardiography is a painless test that uses sound waves to create images of your heart. It provides your doctor with information about the size and shape of your heart and how well your heart's chambers and valves are working. This procedure takes approximately one hour. There are no restrictions for this procedure.    Follow-Up: At [facility], you and your health needs are our priority.  As part of our continuing mission to provide you with exceptional heart care, we have created designated Provider Care Teams.  These Care Teams include your primary Cardiologist (physician) and Advanced Practice Providers (APPs -  Physician Assistants and Nurse Practitioners) who all work together to provide you with the care you need, when you need it. You will need a follow up appointment in 6 months.  Please call our office 2 months in advance to schedule this appointment.  You may see Dr Johney Frame or one of the following Advanced Practice Providers on your designated Care Team:   Gypsy Balsam, NP . Francis Dowse, PA-C  Any Other Special Instructions Will Be Listed Below (If Applicable). Remote monitoring is used to monitor your Pacemaker  from home. This monitoring reduces the number of office visits required to check your device to one time per year. It allows [place] to keep an eye on the functioning of your device to ensure it is working properly. You are scheduled for a  device check from home on 10/11/18. You may send your transmission at any time that day. If you have a wireless device, the transmission will be sent automatically. After your physician reviews your transmission, you will receive a postcard with your next transmission date.

## 2018-10-11 ENCOUNTER — Ambulatory Visit (INDEPENDENT_AMBULATORY_CARE_PROVIDER_SITE_OTHER): Payer: Medicare Other

## 2018-10-11 DIAGNOSIS — I495 Sick sinus syndrome: Secondary | ICD-10-CM | POA: Diagnosis not present

## 2018-10-11 DIAGNOSIS — I4819 Other persistent atrial fibrillation: Secondary | ICD-10-CM

## 2018-10-12 ENCOUNTER — Ambulatory Visit (HOSPITAL_COMMUNITY): Payer: Medicare Other | Attending: Cardiology

## 2018-10-12 DIAGNOSIS — I4819 Other persistent atrial fibrillation: Secondary | ICD-10-CM | POA: Insufficient documentation

## 2018-10-12 DIAGNOSIS — R0602 Shortness of breath: Secondary | ICD-10-CM | POA: Insufficient documentation

## 2018-10-12 DIAGNOSIS — I10 Essential (primary) hypertension: Secondary | ICD-10-CM | POA: Diagnosis not present

## 2018-10-12 DIAGNOSIS — I495 Sick sinus syndrome: Secondary | ICD-10-CM | POA: Diagnosis present

## 2018-10-12 LAB — CUP PACEART REMOTE DEVICE CHECK
Brady Statistic AP VS Percent: 3.34 %
Brady Statistic AS VP Percent: 55.16 %
Implantable Lead Location: 753859
Implantable Lead Model: 5076
Lead Channel Impedance Value: 285 Ohm
Lead Channel Pacing Threshold Pulse Width: 0.4 ms
Lead Channel Sensing Intrinsic Amplitude: 2.75 mV
Lead Channel Sensing Intrinsic Amplitude: 9.125 mV
Lead Channel Sensing Intrinsic Amplitude: 9.125 mV
Lead Channel Setting Pacing Amplitude: 2 V
Lead Channel Setting Pacing Pulse Width: 0.4 ms
MDC IDC LEAD IMPLANT DT: 20180808
MDC IDC LEAD IMPLANT DT: 20180808
MDC IDC LEAD LOCATION: 753860
MDC IDC MSMT BATTERY REMAINING LONGEVITY: 144 mo
MDC IDC MSMT BATTERY VOLTAGE: 3.01 V
MDC IDC MSMT LEADCHNL RA IMPEDANCE VALUE: 380 Ohm
MDC IDC MSMT LEADCHNL RA PACING THRESHOLD AMPLITUDE: 0.75 V
MDC IDC MSMT LEADCHNL RA PACING THRESHOLD PULSEWIDTH: 0.4 ms
MDC IDC MSMT LEADCHNL RA SENSING INTR AMPL: 2.75 mV
MDC IDC MSMT LEADCHNL RV IMPEDANCE VALUE: 399 Ohm
MDC IDC MSMT LEADCHNL RV IMPEDANCE VALUE: 456 Ohm
MDC IDC MSMT LEADCHNL RV PACING THRESHOLD AMPLITUDE: 0.75 V
MDC IDC PG IMPLANT DT: 20180808
MDC IDC SESS DTM: 20200210050125
MDC IDC SET LEADCHNL RA PACING AMPLITUDE: 1.5 V
MDC IDC SET LEADCHNL RV SENSING SENSITIVITY: 0.9 mV
MDC IDC STAT BRADY AP VP PERCENT: 19.17 %
MDC IDC STAT BRADY AS VS PERCENT: 22.33 %
MDC IDC STAT BRADY RA PERCENT PACED: 22.65 %
MDC IDC STAT BRADY RV PERCENT PACED: 74.34 %

## 2018-10-13 ENCOUNTER — Telehealth: Payer: Self-pay

## 2018-10-13 NOTE — Telephone Encounter (Signed)
-----   Message from Marily Lente, NP sent at 10/13/2018  8:49 AM EST ----- Please notify patient of stable echo. Normal LV function. No significant valvular abnormalities. Thanks!

## 2018-10-13 NOTE — Telephone Encounter (Signed)
Notes recorded by Sigurd Sos, RN on 10/13/2018 at 9:36 AM EST The patient has been notified of the result and verbalized understanding. All questions (if any) were answered. Sigurd Sos, RN 10/13/2018 9:36 AM

## 2018-10-13 NOTE — Telephone Encounter (Signed)
-----   Message from Amber K Seiler, NP sent at 10/13/2018  8:49 AM EST ----- Please notify patient of stable echo. Normal LV function. No significant valvular abnormalities. Thanks! 

## 2018-10-13 NOTE — Telephone Encounter (Signed)
Notes recorded by Sigurd Sos, RN on 10/13/2018 at 9:24 AM EST [id number] 2/12 ------

## 2018-10-22 NOTE — Progress Notes (Signed)
Remote pacemaker transmission.   

## 2018-11-03 ENCOUNTER — Other Ambulatory Visit: Payer: Self-pay | Admitting: Family Medicine

## 2018-11-03 DIAGNOSIS — Z8673 Personal history of transient ischemic attack (TIA), and cerebral infarction without residual deficits: Secondary | ICD-10-CM

## 2018-11-04 ENCOUNTER — Ambulatory Visit
Admission: RE | Admit: 2018-11-04 | Discharge: 2018-11-04 | Disposition: A | Discharge status: Home / Self Care | Payer: Medicare Other | Source: Ambulatory Visit | Attending: Family Medicine | Admitting: Family Medicine

## 2018-11-04 DIAGNOSIS — Z8673 Personal history of transient ischemic attack (TIA), and cerebral infarction without residual deficits: Secondary | ICD-10-CM

## 2018-11-04 IMAGING — CR DG CHEST 2V
2 series · 2 of 2 positions shown · non-contrast
Comparison: None.

CLINICAL DATA: Status post pacemaker placement 04/09/2017.

EXAM:
CHEST  2 VIEW

[chest pa]
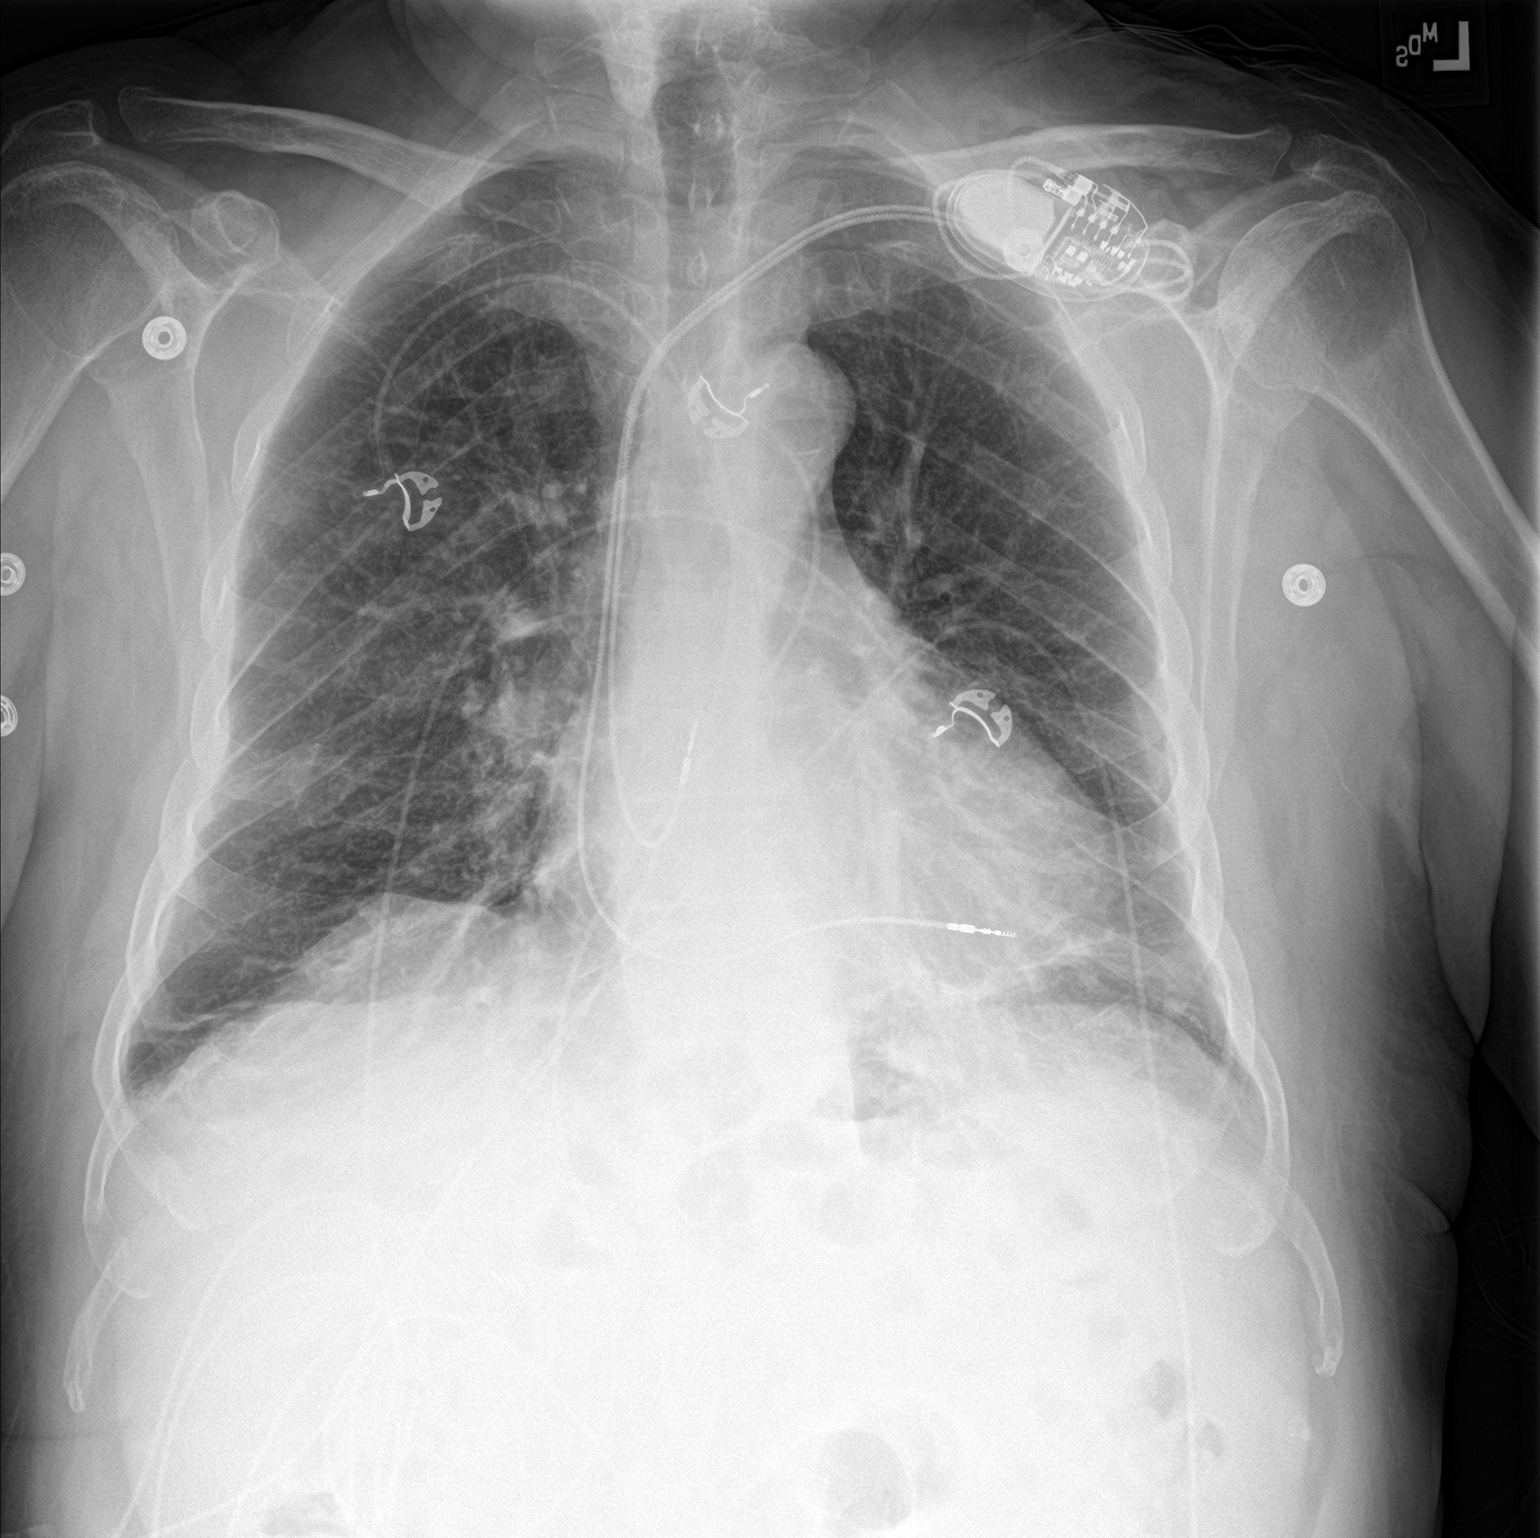

[chest lat]
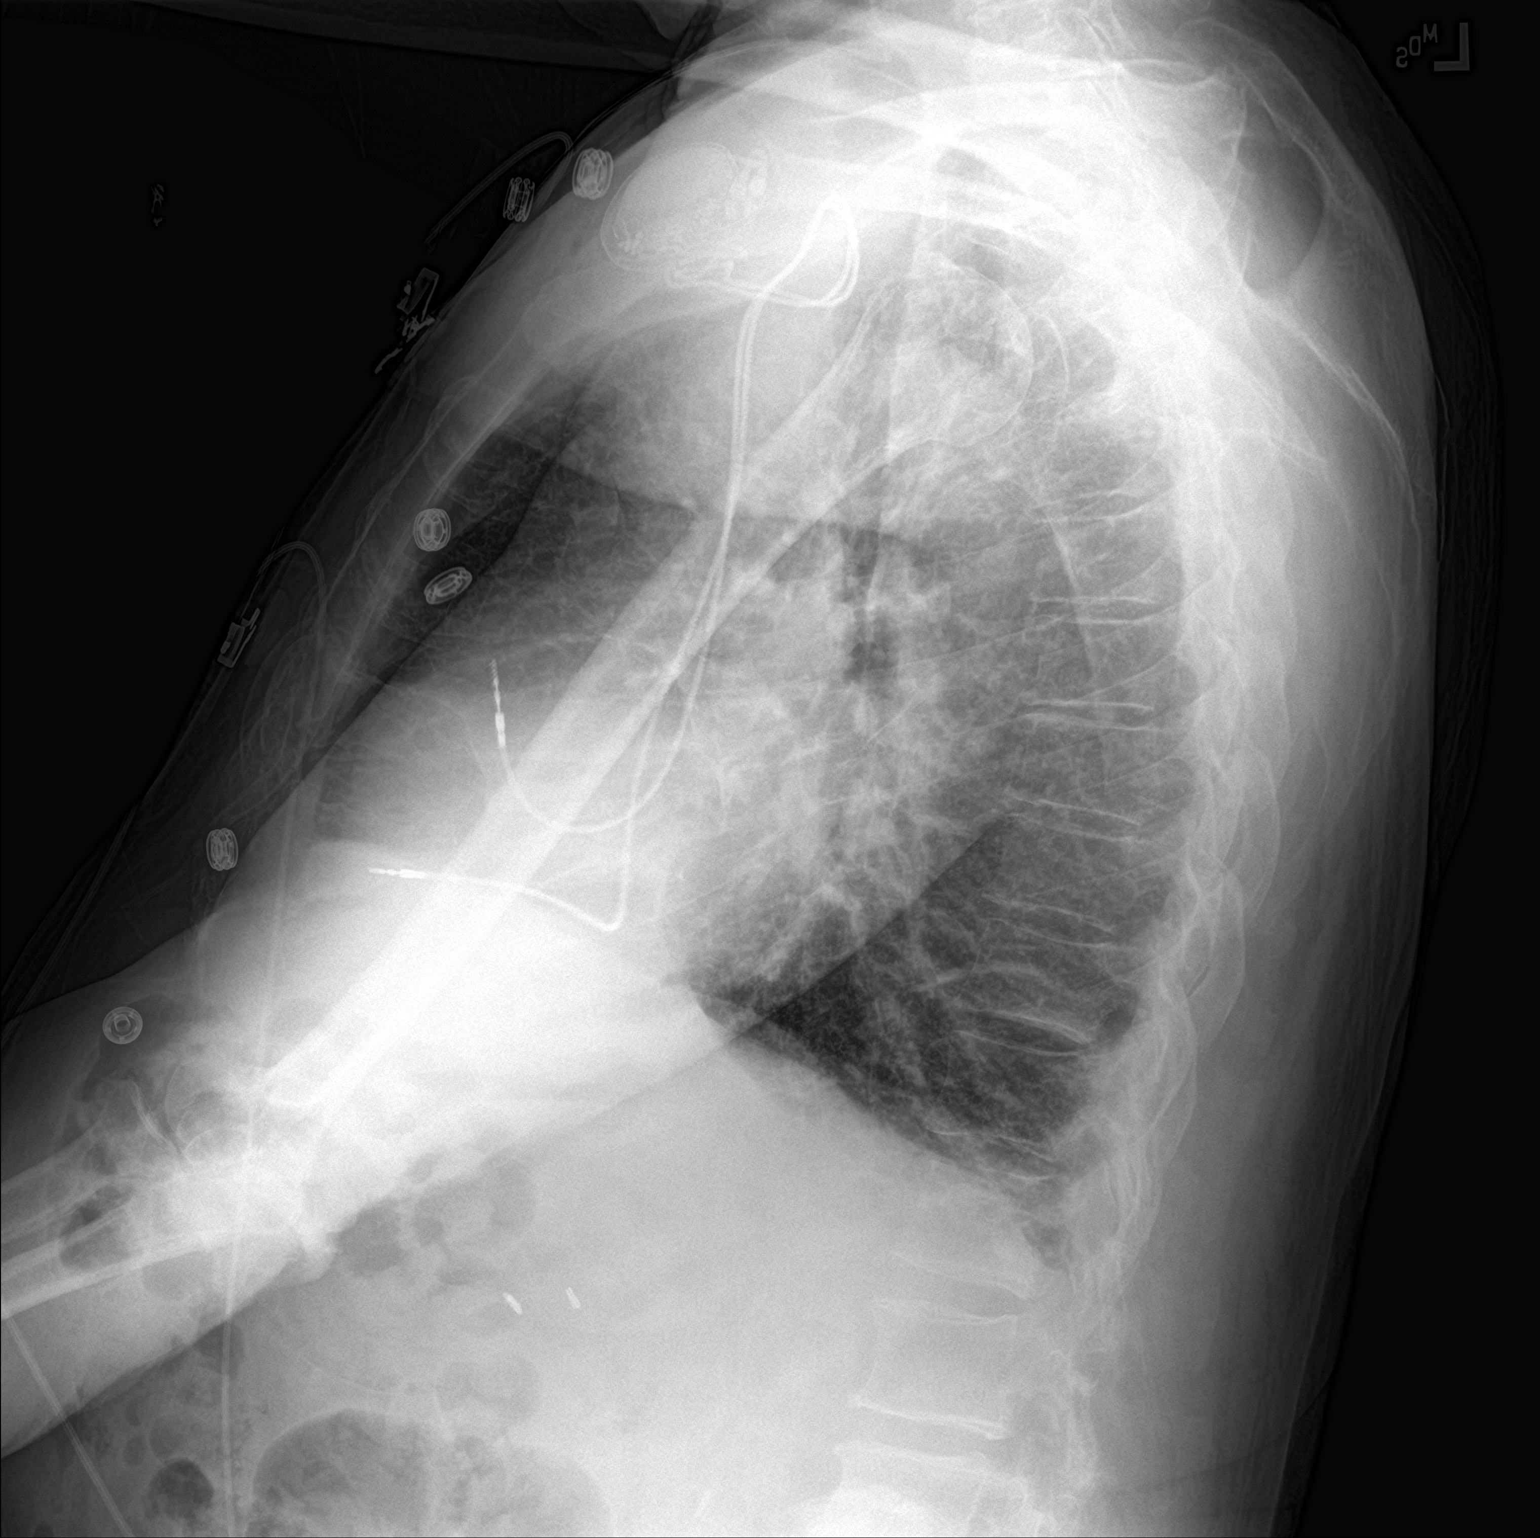

[2 of 2 positions shown; findings below may reference images not displayed]

FINDINGS: Duo lead pacing device in place. Leads are intact with their tips in
the right atrium and right ventricle. No pneumothorax. Scattered
atelectasis is most notable in the left lung base. No pleural
effusion. Heart size is normal. Atherosclerosis noted.
IMPRESSION: Pacemaker in place with the leads projecting in good position. No
pneumothorax.

Subsegmental basilar atelectasis.

## 2018-11-17 ENCOUNTER — Telehealth: Payer: Self-pay

## 2018-11-17 NOTE — Telephone Encounter (Signed)
**Note De-Identified  Obfuscation** The pt dropped off the provider part of a [employer] pt asst application with a request for [place] to complete, have Dr Elease Hashimoto sign and to fax it to [employer] and [employer] when finished.  I have completed the form and gave it to Dr Harvie Bridge nurse awaiting Dr Namon Cirri signature.

## 2018-11-17 NOTE — Telephone Encounter (Signed)
**Note De-Identified  Obfuscation** Dr Elease Hashimoto has signed the application and I have faxed it to [facility] and [place] pt asst foundation.  Per the pts request I did leave a message on his VM (no answer) stating that I have faxed the providers part of his application to [employer] and [place].

## 2018-11-29 NOTE — Telephone Encounter (Signed)
Received notification from [facility] that pt does not meet the programs eligibility requirements at this time for Xarelto.   They have notified the pt.

## 2018-12-29 ENCOUNTER — Other Ambulatory Visit: Payer: Self-pay | Admitting: Cardiovascular Disease

## 2018-12-29 NOTE — Telephone Encounter (Signed)
Pt last saw Gypsy Balsam, NP on 09/29/18, last labs 07/15/18 Creat 1.43, age 85, weight 80.2kg, CrCl 44.4, based on CrCl pt is on appropriate dosage of Xarelto 15mg  QD.  Will refill rx.

## 2019-01-10 ENCOUNTER — Other Ambulatory Visit: Payer: Self-pay

## 2019-01-10 ENCOUNTER — Ambulatory Visit (INDEPENDENT_AMBULATORY_CARE_PROVIDER_SITE_OTHER): Payer: Medicare Other | Admitting: *Deleted

## 2019-01-10 DIAGNOSIS — I495 Sick sinus syndrome: Secondary | ICD-10-CM | POA: Diagnosis not present

## 2019-01-10 DIAGNOSIS — I480 Paroxysmal atrial fibrillation: Secondary | ICD-10-CM

## 2019-01-11 LAB — CUP PACEART REMOTE DEVICE CHECK
Battery Remaining Longevity: 140 mo
Battery Voltage: 3 V
Brady Statistic AP VP Percent: 17.91 %
Brady Statistic AP VS Percent: 4.72 %
Brady Statistic AS VP Percent: 48.24 %
Brady Statistic AS VS Percent: 29.13 %
Brady Statistic RA Percent Paced: 22.56 %
Brady Statistic RV Percent Paced: 66.16 %
Date Time Interrogation Session: 20200511061458
Implantable Lead Implant Date: 20180808
Implantable Lead Implant Date: 20180808
Implantable Lead Location: 753859
Implantable Lead Location: 753860
Implantable Lead Model: 5076
Implantable Lead Model: 5076
Implantable Pulse Generator Implant Date: 20180808
Lead Channel Impedance Value: 304 Ohm
Lead Channel Impedance Value: 399 Ohm
Lead Channel Impedance Value: 399 Ohm
Lead Channel Impedance Value: 456 Ohm
Lead Channel Pacing Threshold Amplitude: 0.75 V
Lead Channel Pacing Threshold Amplitude: 0.875 V
Lead Channel Pacing Threshold Pulse Width: 0.4 ms
Lead Channel Pacing Threshold Pulse Width: 0.4 ms
Lead Channel Sensing Intrinsic Amplitude: 2 mV
Lead Channel Sensing Intrinsic Amplitude: 2 mV
Lead Channel Sensing Intrinsic Amplitude: 8.875 mV
Lead Channel Sensing Intrinsic Amplitude: 8.875 mV
Lead Channel Setting Pacing Amplitude: 1.5 V
Lead Channel Setting Pacing Amplitude: 2 V
Lead Channel Setting Pacing Pulse Width: 0.4 ms
Lead Channel Setting Sensing Sensitivity: 0.9 mV

## 2019-01-19 NOTE — Progress Notes (Signed)
Remote pacemaker transmission.   

## 2019-01-27 ENCOUNTER — Telehealth: Payer: Self-pay | Admitting: Student

## 2019-01-27 NOTE — Telephone Encounter (Signed)
Pt returning phone call. Per Mardelle Matte note I informed him that he was called to discuss his recurrent AF. The pt states he is still on Xarelto. I told him the nurse will give him a call back. The best number to call the pt is [phone]

## 2019-01-27 NOTE — Telephone Encounter (Signed)
Discussed with patient increased AF burden. He did not have any improvement of symptoms with DCCV last year, and has not noticed any change of symptoms going back into AF. V rates controlled in 80-90 range.   He has not missed any doses of Xarelto.  He is OK with watchful waiting and not repeating DCCV at this time.   Will forward to Dr. Johney Frame as an [place]ain Childes.

## 2019-01-27 NOTE — Telephone Encounter (Signed)
Called to discuss recurrent AF. On Xarelto. Previously required DCCV.  Left message with wife for patient to call back.
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
## 2019-04-11 ENCOUNTER — Ambulatory Visit (INDEPENDENT_AMBULATORY_CARE_PROVIDER_SITE_OTHER): Payer: Medicare Other | Admitting: *Deleted

## 2019-04-11 DIAGNOSIS — I4819 Other persistent atrial fibrillation: Secondary | ICD-10-CM

## 2019-04-11 DIAGNOSIS — I495 Sick sinus syndrome: Secondary | ICD-10-CM

## 2019-04-11 LAB — CUP PACEART REMOTE DEVICE CHECK
Battery Remaining Longevity: 136 mo
Battery Voltage: 3 V
Brady Statistic AP VP Percent: 15.13 %
Brady Statistic AP VS Percent: 5.79 %
Brady Statistic AS VP Percent: 41.59 %
Brady Statistic AS VS Percent: 37.48 %
Brady Statistic RA Percent Paced: 20.23 %
Brady Statistic RV Percent Paced: 57.65 %
Date Time Interrogation Session: 20200810062007
Implantable Lead Implant Date: 20180808
Implantable Lead Implant Date: 20180808
Implantable Lead Location: 753859
Implantable Lead Location: 753860
Implantable Lead Model: 5076
Implantable Lead Model: 5076
Implantable Pulse Generator Implant Date: 20180808
Lead Channel Impedance Value: 266 Ohm
Lead Channel Impedance Value: 361 Ohm
Lead Channel Impedance Value: 380 Ohm
Lead Channel Impedance Value: 437 Ohm
Lead Channel Pacing Threshold Amplitude: 0.75 V
Lead Channel Pacing Threshold Amplitude: 0.875 V
Lead Channel Pacing Threshold Pulse Width: 0.4 ms
Lead Channel Pacing Threshold Pulse Width: 0.4 ms
Lead Channel Sensing Intrinsic Amplitude: 2.625 mV
Lead Channel Sensing Intrinsic Amplitude: 2.625 mV
Lead Channel Sensing Intrinsic Amplitude: 7.875 mV
Lead Channel Sensing Intrinsic Amplitude: 7.875 mV
Lead Channel Setting Pacing Amplitude: 1.5 V
Lead Channel Setting Pacing Amplitude: 2 V
Lead Channel Setting Pacing Pulse Width: 0.4 ms
Lead Channel Setting Sensing Sensitivity: 0.9 mV

## 2019-04-15 ENCOUNTER — Telehealth: Payer: Self-pay

## 2019-04-18 ENCOUNTER — Telehealth (INDEPENDENT_AMBULATORY_CARE_PROVIDER_SITE_OTHER): Payer: Medicare Other | Admitting: Internal Medicine

## 2019-04-18 ENCOUNTER — Other Ambulatory Visit: Payer: Self-pay

## 2019-04-18 VITALS — BP 157/84 | HR 89 | Ht 66.0 in | Wt 160.0 lb

## 2019-04-18 DIAGNOSIS — I4819 Other persistent atrial fibrillation: Secondary | ICD-10-CM

## 2019-04-18 DIAGNOSIS — I10 Essential (primary) hypertension: Secondary | ICD-10-CM

## 2019-04-18 DIAGNOSIS — I495 Sick sinus syndrome: Secondary | ICD-10-CM

## 2019-04-18 NOTE — Progress Notes (Signed)
Electrophysiology TeleHealth Note  Due to national recommendations of social distancing due to COVID 19, an audio telehealth visit is felt to be most appropriate for this patient at this time.  Verbal consent was obtained by me for the telehealth visit today.  The patient does not have capability for a virtual visit.  A phone visit is therefore required today.   Date:  04/18/2019   ID:  Mario Fisher, DOB [birth year], MRN [id number]  Location: patient's home  Provider location:  [facility]  Evaluation Performed: Follow-up visit  PCP:  Catha Gosselin, MD   Electrophysiologist:  Dr Mario Fisher  Chief Complaint:  palpitations  History of Present Illness:    Mario Fisher is a 85 y.o. male who presents via telehealth conferencing today.  Since last being seen in our clinic, the patient reports doing very well.  SOB is stable.  Today, he denies symptoms of palpitations, chest pain, shortness of breath,  lower extremity edema, dizziness, presyncope, or syncope.  The patient is otherwise without complaint today.  The patient denies symptoms of fevers, chills, cough, or new SOB worrisome for COVID 19.  Past Medical History:  Diagnosis Date  . Arthritis    "back" (04/08/2017)  . Atrial fibrillation (HCC)   . B12 deficiency anemia   . BPH (benign prostatic hypertrophy)   . Chronic anticoagulation    on Pradaxa  . Chronic lower back pain   . Glaucoma, both eyes   . Glucose intolerance (impaired glucose tolerance)   . History of blood transfusion    "I'm thinking they gave me blood when I had the gallbladder OR" (04/08/2017)  . History of gout   . History of hiatal hernia   . Hyperlipidemia   . Hypertension   . OSA on CPAP   . Presence of permanent cardiac pacemaker   . Stroke [facility])    remote CVA noted on MRI; "silent stroke; didn't know when it happened" (04/08/2017)    Past Surgical History:  Procedure Laterality Date  . CARDIOVERSION  04/08/2017   Procedure: Cardioversion;   Surgeon: Mario Range, MD;  [facility];  Service: Cardiovascular;;  . CARDIOVERSION N/A 07/27/2018   Procedure: CARDIOVERSION;  Surgeon: Mario Bunting, MD;  Location: [facility] ENDOSCOPY;  Service: Cardiovascular;  Laterality: N/A;  . CATARACT EXTRACTION W/ INTRAOCULAR LENS  IMPLANT, BILATERAL Bilateral   . CHOLECYSTECTOMY OPEN    . PACEMAKER IMPLANT N/A 04/08/2017   Medtronic Azure XT dual-chamber MRI conditional pacemaker for symptomatic bradycardia by Dr Mario Fisher  . US ECHOCARDIOGRAPHY  02/25/2005   ef 55-60%    Current Outpatient Medications  Medication Sig Dispense Refill  . acetaminophen (TYLENOL) 500 MG tablet Take 1,000 mg by mouth every 6 (six) hours as needed for moderate pain or headache.    . allopurinol (ZYLOPRIM) 100 MG tablet Take 100 mg by mouth 2 (two) times daily.      . Cholecalciferol (VITAMIN D-3) 5000 UNITS TABS Take 5,000 Units by mouth daily.     . cyanocobalamin (,VITAMIN B-12,) 1000 MCG/ML injection Inject 1,000 mcg into the muscle every 30 (thirty) days.    . dorzolamide (TRUSOPT) 2 % ophthalmic solution Place 1 drop into both eyes 2 (two) times daily.  12  . Glucosamine-Chondroit-Vit C-Mn (GLUCOSAMINE CHONDR 1500 COMPLX PO) Take 1 tablet by mouth daily.     . Multiple Vitamins-Minerals (MULTIVITAMIN ADULTS 50+ PO) Take 1 tablet by mouth daily.    . Omega-3 Fatty Acids (FISH OIL)  1200 MG CAPS Take 1,200-2,400 mg by mouth See admin instructions. Take 2400 mg by mouth in the morning and take 1200 mg by mouth at bedtime    . Propylene Glycol (SYSTANE BALANCE) 0.6 % SOLN Place 1 drop into both eyes 2 (two) times daily.    . Tamsulosin HCl (FLOMAX) 0.4 MG CAPS Take 0.4 mg by mouth daily.      . valsartan-hydrochlorothiazide (DIOVAN-HCT) 80-12.5 MG per tablet Take 1 tablet by mouth daily.      [name] 15 MG TABS tablet TAKE 1 TABLET BY MOUTH ONCE DAILY WITH  SUPPER 90 tablet 1   No current facility-administered medications for this visit.     Allergies:    Patient has no known allergies.   Social History:  The patient  reports that he quit smoking about 50 years ago. His smoking use included cigars. He quit after 0.50 years of use. He has never used smokeless tobacco. He reports that he does not drink alcohol or use drugs.   Family History:  The patient's family history includes Arrhythmia in his mother; Heart attack in his father; Heart disease in his sister; Hypertension in his brother and father; Stroke in his father.   ROS:  Please see the history of present illness.   All other systems are personally reviewed and negative.    Exam:    Vital Signs:  BP (!) 157/84   Pulse 89   Ht 5\' 6"  (1.676 m)   Wt 160 lb (72.6 kg)   BMI 25.82 kg/m   Well sounding   Labs/Other Tests and Data Reviewed:    Recent Labs: 07/15/2018: BUN 24; Creatinine, Ser 1.43; Platelets 303 07/27/2018: Hemoglobin 15.0; Potassium 3.8; Sodium 140   Wt Readings from Last 3 Encounters:  04/18/19 160 lb (72.6 kg)  09/29/18 176 lb 12.8 oz (80.2 kg)  07/15/18 178 lb (80.7 kg)     Last device remote is reviewed from [place] PDF which reveals normal device function, afib is well controlled, normal histograms   ASSESSMENT & PLAN:    1.  Symptomatic bradycardia Remotes are uptodate Normal device function  2. Persistent atrial fibrillation/ atypical atrial flutter Stable No change required today On xarelto  3. HTN Stable No change required today  4. SOB Unclear etiology Normal pacemaker function Histograms look good.  Remains in sinus. Echo 10/12/18 unremarkable. Did not improve with sinus   Follow-up:  [facility] Return to see EP NP in a year Follow-up with Dr Mario Fisher for SOB   Patient Risk:  after full review of this patients clinical status, I feel that they are at moderate risk at this time.  Today, I have spent 15 minutes with the patient with telehealth technology discussing arrhythmia management .    Army Fossa, MD  04/18/2019 2:44  PM     [facility] [address] [place] [address] [phone] (office) [phone] (fax)

## 2019-04-20 NOTE — Progress Notes (Signed)
Remote pacemaker transmission.   

## 2019-06-28 ENCOUNTER — Other Ambulatory Visit: Payer: Self-pay | Admitting: Cardiovascular Disease

## 2019-06-28 NOTE — Telephone Encounter (Signed)
Pt last saw Dr Rayann Heman 04/18/19 telemedicine Covid-19, last labs 07/15/18 Creat 1.43, age 85, weight 72.6, CrCl 40.19, based on CrCl pt is on appropriate dosage of Xarelto 15mg  QD.  Will refill rx.

## 2019-07-11 ENCOUNTER — Ambulatory Visit (INDEPENDENT_AMBULATORY_CARE_PROVIDER_SITE_OTHER): Payer: Medicare Other | Admitting: *Deleted

## 2019-07-11 DIAGNOSIS — I495 Sick sinus syndrome: Secondary | ICD-10-CM | POA: Diagnosis not present

## 2019-07-11 DIAGNOSIS — I4891 Unspecified atrial fibrillation: Secondary | ICD-10-CM

## 2019-07-12 LAB — CUP PACEART REMOTE DEVICE CHECK
Battery Remaining Longevity: 132 mo
Battery Voltage: 2.99 V
Brady Statistic AP VP Percent: 19.46 %
Brady Statistic AP VS Percent: 8.12 %
Brady Statistic AS VP Percent: 38.12 %
Brady Statistic AS VS Percent: 34.31 %
Brady Statistic RA Percent Paced: 27.15 %
Brady Statistic RV Percent Paced: 57.57 %
Date Time Interrogation Session: 20201109062308
Implantable Lead Implant Date: 20180808
Implantable Lead Implant Date: 20180808
Implantable Lead Location: 753859
Implantable Lead Location: 753860
Implantable Lead Model: 5076
Implantable Lead Model: 5076
Implantable Pulse Generator Implant Date: 20180808
Lead Channel Impedance Value: 266 Ohm
Lead Channel Impedance Value: 361 Ohm
Lead Channel Impedance Value: 361 Ohm
Lead Channel Impedance Value: 437 Ohm
Lead Channel Pacing Threshold Amplitude: 0.875 V
Lead Channel Pacing Threshold Amplitude: 1 V
Lead Channel Pacing Threshold Pulse Width: 0.4 ms
Lead Channel Pacing Threshold Pulse Width: 0.4 ms
Lead Channel Sensing Intrinsic Amplitude: 2.75 mV
Lead Channel Sensing Intrinsic Amplitude: 2.75 mV
Lead Channel Sensing Intrinsic Amplitude: 9.625 mV
Lead Channel Sensing Intrinsic Amplitude: 9.625 mV
Lead Channel Setting Pacing Amplitude: 2 V
Lead Channel Setting Pacing Amplitude: 2 V
Lead Channel Setting Pacing Pulse Width: 0.4 ms
Lead Channel Setting Sensing Sensitivity: 0.9 mV

## 2019-08-08 NOTE — Progress Notes (Signed)
Remote pacemaker transmission.   

## 2019-10-10 ENCOUNTER — Ambulatory Visit (INDEPENDENT_AMBULATORY_CARE_PROVIDER_SITE_OTHER): Payer: Medicare Other | Admitting: *Deleted

## 2019-10-10 DIAGNOSIS — I495 Sick sinus syndrome: Secondary | ICD-10-CM | POA: Diagnosis not present

## 2019-10-10 LAB — CUP PACEART REMOTE DEVICE CHECK
Battery Remaining Longevity: 131 mo
Battery Voltage: 2.99 V
Brady Statistic AP VP Percent: 15.35 %
Brady Statistic AP VS Percent: 8.89 %
Brady Statistic AS VP Percent: 37.79 %
Brady Statistic AS VS Percent: 37.97 %
Brady Statistic RA Percent Paced: 23.72 %
Brady Statistic RV Percent Paced: 53.14 %
Date Time Interrogation Session: 20210208011306
Implantable Lead Implant Date: 20180808
Implantable Lead Implant Date: 20180808
Implantable Lead Location: 753859
Implantable Lead Location: 753860
Implantable Lead Model: 5076
Implantable Lead Model: 5076
Implantable Pulse Generator Implant Date: 20180808
Lead Channel Impedance Value: 285 Ohm
Lead Channel Impedance Value: 361 Ohm
Lead Channel Impedance Value: 399 Ohm
Lead Channel Impedance Value: 475 Ohm
Lead Channel Pacing Threshold Amplitude: 0.875 V
Lead Channel Pacing Threshold Amplitude: 0.875 V
Lead Channel Pacing Threshold Pulse Width: 0.4 ms
Lead Channel Pacing Threshold Pulse Width: 0.4 ms
Lead Channel Sensing Intrinsic Amplitude: 10.375 mV
Lead Channel Sensing Intrinsic Amplitude: 10.375 mV
Lead Channel Sensing Intrinsic Amplitude: 2.75 mV
Lead Channel Sensing Intrinsic Amplitude: 2.75 mV
Lead Channel Setting Pacing Amplitude: 1.75 V
Lead Channel Setting Pacing Amplitude: 2 V
Lead Channel Setting Pacing Pulse Width: 0.4 ms
Lead Channel Setting Sensing Sensitivity: 0.9 mV

## 2019-10-11 NOTE — Progress Notes (Signed)
PPM Remote  

## 2019-12-21 ENCOUNTER — Encounter: Payer: Self-pay | Admitting: Cardiovascular Disease

## 2019-12-21 ENCOUNTER — Ambulatory Visit: Payer: Medicare Other | Admitting: Cardiovascular Disease

## 2019-12-21 ENCOUNTER — Other Ambulatory Visit: Payer: Self-pay

## 2019-12-21 VITALS — BP 116/74 | HR 70 | Ht 66.0 in | Wt 170.0 lb

## 2019-12-21 DIAGNOSIS — I10 Essential (primary) hypertension: Secondary | ICD-10-CM | POA: Diagnosis not present

## 2019-12-21 DIAGNOSIS — I4891 Unspecified atrial fibrillation: Secondary | ICD-10-CM | POA: Diagnosis not present

## 2019-12-21 NOTE — Patient Instructions (Signed)
Medication Instructions:  Your physician recommends that you continue on your current medications as directed. Please refer to the Current Medication list given to you today.  *If you need a refill on your cardiac medications before your next appointment, please call your pharmacy*   Lab Work: None If you have labs (blood work) drawn today and your tests are completely normal, you will receive your results only by: Marland Kitchen MyChart Message (if you have MyChart) OR . A paper copy in the mail If you have any lab test that is abnormal or we need to change your treatment, we will call you to review the results.   Testing/Procedures: None   Follow-Up: At [facility], you and your health needs are our priority.  As part of our continuing mission to provide you with exceptional heart care, we have created designated Provider Care Teams.  These Care Teams include your primary Cardiologist (physician) and Advanced Practice Providers (APPs -  Physician Assistants and Nurse Practitioners) who all work together to provide you with the care you need, when you need it.  We recommend signing up for the patient portal called "MyChart".  Sign up information is provided on this After Visit Summary.  MyChart is used to connect with patients for Virtual Visits (Telemedicine).  Patients are able to view lab/test results, encounter notes, upcoming appointments, etc.  Non-urgent messages can be sent to your provider as well.   To learn more about what you can do with MyChart, go to [email or web address].    Your next appointment:   12 month(s)  The format for your next appointment:   In Person  Provider:   You may see Kristeen Miss, MD or one of the following Advanced Practice Providers on your designated Care Team:    Tereso Newcomer, PA-C  [place], [place]  Berton Bon, NP    Other Instructions
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
## 2019-12-29 ENCOUNTER — Other Ambulatory Visit (HOSPITAL_COMMUNITY): Payer: Self-pay | Admitting: Family Medicine

## 2019-12-29 DIAGNOSIS — I25119 Atherosclerotic heart disease of native coronary artery with unspecified angina pectoris: Secondary | ICD-10-CM

## 2020-01-02 ENCOUNTER — Other Ambulatory Visit: Payer: Self-pay | Admitting: Internal Medicine

## 2020-01-02 NOTE — Telephone Encounter (Signed)
Xarelto 15mg  refill request received. Pt is 86 years old, weight-77.1kg, Crea-1.29 on 10/25/2018 via [facility] at [facility] PCP-PT NEEDS LABS, called the pt's PCP office and they stated she has a lab appt on 01/05/2020 and I can call back to get a copy of those labs once resulted; last seen by Dr. 03/06/2020 on 12/21/2019, Diagnosis-Afib, CrCl-39.43ml/min using the current labs. Dose is appropriate based on dosing criteria. Will send in refill to requested pharmacy.   Will follow up to ensure pt does get labs done as this important to ensure correct doseage.

## 2020-01-05 ENCOUNTER — Ambulatory Visit (HOSPITAL_COMMUNITY): Payer: Medicare Other

## 2020-01-05 MED ORDER — RIVAROXABAN 15 MG PO TABS: ORAL_TABLET | ORAL | 1 refills | Status: DC

## 2020-01-05 NOTE — Telephone Encounter (Addendum)
Labs received from PCP.  Xarelto 15mg  refill request received. Pt is 86 years old, weight-77.1kg, Crea-1.66 on 01/05/2020 via fax PCP Dr. 03/06/2020 on today, last seen by Dr. Clarene Duke on 12/21/2019, Diagnosis-Afib, CrCl-36.58ml/min; Dose is appropriate based on dosing criteria. Original refill was sent on 01/02/2020 for 90 day supply. Sent in refill to requested pharmacy.

## 2020-01-05 NOTE — Addendum Note (Signed)
Addended by: Pamala Hurry B on: 01/05/2020 04:20 PM   Modules accepted: Orders

## 2020-01-09 ENCOUNTER — Ambulatory Visit (INDEPENDENT_AMBULATORY_CARE_PROVIDER_SITE_OTHER): Payer: Medicare Other | Admitting: *Deleted

## 2020-01-09 DIAGNOSIS — I495 Sick sinus syndrome: Secondary | ICD-10-CM | POA: Diagnosis not present

## 2020-01-09 LAB — CUP PACEART REMOTE DEVICE CHECK
Battery Remaining Longevity: 128 mo
Battery Voltage: 2.99 V
Brady Statistic AP VP Percent: 12.03 %
Brady Statistic AP VS Percent: 7.36 %
Brady Statistic AS VP Percent: 43.37 %
Brady Statistic AS VS Percent: 37.24 %
Brady Statistic RA Percent Paced: 18.96 %
Brady Statistic RV Percent Paced: 55.4 %
Date Time Interrogation Session: 20210510022119
Implantable Lead Implant Date: 20180808
Implantable Lead Implant Date: 20180808
Implantable Lead Location: 753859
Implantable Lead Location: 753860
Implantable Lead Model: 5076
Implantable Lead Model: 5076
Implantable Pulse Generator Implant Date: 20180808
Lead Channel Impedance Value: 285 Ohm
Lead Channel Impedance Value: 342 Ohm
Lead Channel Impedance Value: 380 Ohm
Lead Channel Impedance Value: 456 Ohm
Lead Channel Pacing Threshold Amplitude: 0.75 V
Lead Channel Pacing Threshold Amplitude: 0.875 V
Lead Channel Pacing Threshold Pulse Width: 0.4 ms
Lead Channel Pacing Threshold Pulse Width: 0.4 ms
Lead Channel Sensing Intrinsic Amplitude: 3 mV
Lead Channel Sensing Intrinsic Amplitude: 3 mV
Lead Channel Sensing Intrinsic Amplitude: 8.75 mV
Lead Channel Sensing Intrinsic Amplitude: 8.75 mV
Lead Channel Setting Pacing Amplitude: 1.5 V
Lead Channel Setting Pacing Amplitude: 2 V
Lead Channel Setting Pacing Pulse Width: 0.4 ms
Lead Channel Setting Sensing Sensitivity: 0.9 mV

## 2020-01-10 NOTE — Progress Notes (Signed)
Remote pacemaker transmission.   

## 2020-01-18 ENCOUNTER — Other Ambulatory Visit: Payer: Self-pay

## 2020-01-18 ENCOUNTER — Ambulatory Visit (HOSPITAL_COMMUNITY)
Admission: RE | Admit: 2020-01-18 | Discharge: 2020-01-18 | Disposition: A | Discharge status: Home / Self Care | Payer: Medicare Other | Source: Ambulatory Visit | Attending: Cardiology | Admitting: Cardiology

## 2020-01-18 DIAGNOSIS — I25119 Atherosclerotic heart disease of native coronary artery with unspecified angina pectoris: Secondary | ICD-10-CM | POA: Diagnosis present

## 2020-01-18 NOTE — Progress Notes (Signed)
Carotid artery duplex has been completed. Preliminary results can be found in CV Proc through chart review.   01/18/20 10:15 AM Olen Cordial RVT

## 2020-04-09 ENCOUNTER — Ambulatory Visit (INDEPENDENT_AMBULATORY_CARE_PROVIDER_SITE_OTHER): Payer: Medicare Other | Admitting: *Deleted

## 2020-04-09 DIAGNOSIS — I495 Sick sinus syndrome: Secondary | ICD-10-CM | POA: Diagnosis not present

## 2020-04-09 LAB — CUP PACEART REMOTE DEVICE CHECK
Battery Remaining Longevity: 117 mo
Battery Voltage: 2.98 V
Brady Statistic AP VP Percent: 20.98 %
Brady Statistic AP VS Percent: 4.2 %
Brady Statistic AS VP Percent: 45.38 %
Brady Statistic AS VS Percent: 29.45 %
Brady Statistic RA Percent Paced: 24.97 %
Brady Statistic RV Percent Paced: 66.35 %
Date Time Interrogation Session: 20210808212502
Implantable Lead Implant Date: 20180808
Implantable Lead Implant Date: 20180808
Implantable Lead Location: 753859
Implantable Lead Location: 753860
Implantable Lead Model: 5076
Implantable Lead Model: 5076
Implantable Pulse Generator Implant Date: 20180808
Lead Channel Impedance Value: 285 Ohm
Lead Channel Impedance Value: 342 Ohm
Lead Channel Impedance Value: 380 Ohm
Lead Channel Impedance Value: 456 Ohm
Lead Channel Pacing Threshold Amplitude: 0.75 V
Lead Channel Pacing Threshold Amplitude: 1.25 V
Lead Channel Pacing Threshold Pulse Width: 0.4 ms
Lead Channel Pacing Threshold Pulse Width: 0.4 ms
Lead Channel Sensing Intrinsic Amplitude: 2.375 mV
Lead Channel Sensing Intrinsic Amplitude: 2.375 mV
Lead Channel Sensing Intrinsic Amplitude: 8.25 mV
Lead Channel Sensing Intrinsic Amplitude: 8.25 mV
Lead Channel Setting Pacing Amplitude: 2 V
Lead Channel Setting Pacing Amplitude: 2.5 V
Lead Channel Setting Pacing Pulse Width: 0.4 ms
Lead Channel Setting Sensing Sensitivity: 0.9 mV

## 2020-04-10 NOTE — Progress Notes (Signed)
Remote pacemaker transmission.   

## 2020-04-20 ENCOUNTER — Ambulatory Visit (INDEPENDENT_AMBULATORY_CARE_PROVIDER_SITE_OTHER): Payer: Medicare Other | Admitting: Physician Assistant

## 2020-04-20 ENCOUNTER — Other Ambulatory Visit: Payer: Self-pay

## 2020-04-20 VITALS — BP 136/60 | HR 68 | Ht 66.0 in | Wt 171.0 lb

## 2020-04-20 DIAGNOSIS — Z950 Presence of cardiac pacemaker: Secondary | ICD-10-CM | POA: Diagnosis not present

## 2020-04-20 DIAGNOSIS — I10 Essential (primary) hypertension: Secondary | ICD-10-CM | POA: Diagnosis not present

## 2020-04-20 DIAGNOSIS — I480 Paroxysmal atrial fibrillation: Secondary | ICD-10-CM | POA: Diagnosis not present

## 2020-04-20 DIAGNOSIS — I495 Sick sinus syndrome: Secondary | ICD-10-CM | POA: Diagnosis not present

## 2020-04-20 DIAGNOSIS — I4891 Unspecified atrial fibrillation: Secondary | ICD-10-CM | POA: Diagnosis not present

## 2020-04-20 NOTE — Progress Notes (Signed)
Cardiology Office Note Date:  04/20/2020  Patient ID:  Mario Fisher, Mario Fisher [birth year], MRN [id number] PCP:  Catha Gosselin, MD  Cardiologist:  Dr. Elease Hashimoto EP: Dr. Johney Frame    Chief Complaint: annual device visit  History of Present Illness: Mario Fisher is a 86 y.o. male with history of HTN, HLD, AFib, stroke symptomatic bradycardia w/PPM.  He comes in today to be seen for dr. Johney Frame, last seen by him via tele health Aug 2020, doing well, normal remotes, tolerating xarelto, no changes were made.  More recently he saw Dr. Elease Hashimoto 12/21/19, arthritic complaints, though active, was in SR, no changes were made.  TODAY He is doing well. Very active, still working on cars, doing yard work, no exertional intolerances No CP, palpitations or cardiac awareness.   No SOB, DOE No dizzy spells, near syncope or syncope  No bleeding or signs of bleeding   Device information MDT dual chamber PPM implanted 04/08/2017  Past Medical History:  Diagnosis Date  . Arthritis    "back" (04/08/2017)  . Atrial fibrillation (HCC)   . B12 deficiency anemia   . BPH (benign prostatic hypertrophy)   . Chronic anticoagulation    on Pradaxa  . Chronic lower back pain   . Glaucoma, both eyes   . Glucose intolerance (impaired glucose tolerance)   . History of blood transfusion    "I'm thinking they gave me blood when I had the gallbladder OR" (04/08/2017)  . History of gout   . History of hiatal hernia   . Hyperlipidemia   . Hypertension   . OSA on CPAP   . Presence of permanent cardiac pacemaker   . Stroke [facility])    remote CVA noted on MRI; "silent stroke; didn't know when it happened" (04/08/2017)    Past Surgical History:  Procedure Laterality Date  . CARDIOVERSION  04/08/2017   Procedure: Cardioversion;  Surgeon: Hillis Range, MD;  [facility];  Service: Cardiovascular;;  . CARDIOVERSION N/A 07/27/2018   Procedure: CARDIOVERSION;  Surgeon: Lewayne Bunting, MD;  Location: [facility]  ENDOSCOPY;  Service: Cardiovascular;  Laterality: N/A;  . CATARACT EXTRACTION W/ INTRAOCULAR LENS  IMPLANT, BILATERAL Bilateral   . CHOLECYSTECTOMY OPEN    . PACEMAKER IMPLANT N/A 04/08/2017   Medtronic Azure XT dual-chamber MRI conditional pacemaker for symptomatic bradycardia by Dr Johney Frame  . US ECHOCARDIOGRAPHY  02/25/2005   ef 55-60%    Current Outpatient Medications  Medication Sig Dispense Refill  . acetaminophen (TYLENOL) 500 MG tablet Take 650 mg by mouth every 6 (six) hours as needed for moderate pain or headache.     . allopurinol (ZYLOPRIM) 100 MG tablet Take 100 mg by mouth 2 (two) times daily.      . Cholecalciferol (VITAMIN D-3) 5000 UNITS TABS Take 5,000 Units by mouth daily.     . cyanocobalamin (,VITAMIN B-12,) 1000 MCG/ML injection Inject 1,000 mcg into the muscle every 30 (thirty) days.    . dorzolamide (TRUSOPT) 2 % ophthalmic solution Place 1 drop into both eyes 2 (two) times daily.  12  . Glucosamine-Chondroit-Vit C-Mn (GLUCOSAMINE CHONDR 1500 COMPLX PO) Take 1 tablet by mouth daily.     Marland Kitchen losartan (COZAAR) 25 MG tablet Take 25 mg by mouth daily.    . Multiple Vitamins-Minerals (MULTIVITAMIN ADULTS 50+ PO) Take 1 tablet by mouth daily.    . Omega-3 Fatty Acids (FISH OIL) 1200 MG CAPS Take 1,200-2,400 mg by mouth See admin instructions. Take 2400 mg by mouth in  the morning and take 1200 mg by mouth at bedtime    . Propylene Glycol (SYSTANE BALANCE) 0.6 % SOLN Place 1 drop into both eyes 2 (two) times daily.    . Rivaroxaban (XARELTO) 15 MG TABS tablet TAKE 1 TABLET BY MOUTH ONCE DAILY WITH SUPPER 90 tablet 1  . Tamsulosin HCl (FLOMAX) 0.4 MG CAPS Take 0.4 mg by mouth daily.       No current facility-administered medications for this visit.    Allergies:   Patient has no known allergies.   Social History:  The patient  reports that he quit smoking about 51 years ago. His smoking use included cigars. He quit after 0.50 years of use. He has never used smokeless tobacco. He  reports that he does not drink alcohol and does not use drugs.   Family History:  The patient's family history includes Arrhythmia in his mother; Heart attack in his father; Heart disease in his sister; Hypertension in his brother and father; Stroke in his father.  ROS:  Please see the history of present illness. All other systems are reviewed and otherwise negative.   PHYSICAL EXAM:  VS:  BP 136/60   Pulse 68   Ht 5\' 6"  (1.676 m)   Wt 171 lb (77.6 kg)   SpO2 98%   BMI 27.60 kg/m  BMI: Body mass index is 27.6 kg/m. Well nourished, well developed, in no acute distress  HEENT: normocephalic, atraumatic  Neck: no JVD, carotid bruits or masses Cardiac: RRR; no significant murmurs, no rubs, or gallops Lungs:  CTA b/l, no wheezing, rhonchi or rales  Abd: soft, nontender MS: no deformity or atrophy Ext: no edema  Skin: warm and dry, no rash Neuro:  No gross deficits appreciated Psych: euthymic mood, full affect  PPM site is stable, no tethering or discomfort   EKG:  Not done today  PPM interrogation done today and reviewed by myself Battery and lead measurements are good AF burden 0.6% Available EGMs are Aflutter rate controlled or V paced and short duration all < NSVT episodes were Feb 2020   10/12/2018: TTE IMPRESSIONS  1. The left ventricle has normal systolic function of 60-65%. The cavity  size was normal. There is mild concentric left ventricular hypertrophy.  Left ventricular diastolic Doppler parameters are consistent with  pseudonormal Elevated left ventricular  end-diastolic pressure.  2. The right ventricle has normal systolic function. The cavity was  normal. There is no increase in right ventricular wall thickness. Right  ventricular systolic pressure is moderately elevated with an estimated  pressure of 41 mmHg.  3. Left atrial size was mildly dilated.  4. The tricuspid valve is normal in structure. Tricuspid valve  regurgitation is moderate.    5. The aortic valve is tricuspid There is mild thickening and mild  calcification of the aortic valve. Aortic valve regurgitation is trivial  by color flow Doppler.  6. The pulmonic valve was normal in structure. Pulmonic valve  regurgitation is mild by color flow Doppler.  7. Right atrial size was mildly dilated.  8. The mitral valve is normal in structure.    Recent Labs: No results found for requested labs within last 8760 hours.  No results found for requested labs within last 8760 hours.   CrCl cannot be calculated (Patient's most recent lab result is older than the maximum 21 days allowed.).   Wt Readings from Last 3 Encounters:  04/20/20 171 lb (77.6 kg)  12/21/19 170 lb (77.1 kg)  04/18/19  160 lb (72.6 kg)     Other studies reviewed: Additional studies/records reviewed today include: summarized above  ASSESSMENT AND PLAN:  1. PPM     Intact function, no programming changes made  2. Paroxysmal AFib     CHA2DS2Vasc is 5, on Xarelto, appropriately dosed     low burden  Labs from May done via his PMD reviewed H/h stable, creat 1.66 (calc crCl using today's weight is 36  3. HTN     Had been worried about low BP readings at times in the morning associate with weakness and high in the afternoons, his PMD just changed him from valsartan HCTZ 80/12.5 > losartan 25mg  daily started yesterday      He will continue to monitor and f/u with PMD   Disposition: F/u with Dr. Elease Hashimoto as directed by him, remotes Q 8mo and EP in clinic in 1 year, sooner if needed.   Current medicines are reviewed at length with the patient today.  The patient did not have any concerns regarding medicines.  Norma Fredrickson, PA-C 04/20/2020 12:59 PM     [facility] [address] [place] [address] [phone] (office)  [phone] (fax)

## 2020-04-20 NOTE — Patient Instructions (Signed)

## 2020-06-08 ENCOUNTER — Other Ambulatory Visit: Payer: Self-pay | Admitting: Family Medicine

## 2020-06-08 DIAGNOSIS — N289 Disorder of kidney and ureter, unspecified: Secondary | ICD-10-CM

## 2020-06-15 ENCOUNTER — Ambulatory Visit
Admission: RE | Admit: 2020-06-15 | Discharge: 2020-06-15 | Disposition: A | Discharge status: Home / Self Care | Payer: Medicare Other | Source: Ambulatory Visit | Attending: Family Medicine | Admitting: Family Medicine

## 2020-06-15 DIAGNOSIS — N289 Disorder of kidney and ureter, unspecified: Secondary | ICD-10-CM

## 2020-09-06 DIAGNOSIS — N183 Chronic kidney disease, stage 3 unspecified: Secondary | ICD-10-CM | POA: Diagnosis not present

## 2020-09-26 DIAGNOSIS — M7989 Other specified soft tissue disorders: Secondary | ICD-10-CM | POA: Diagnosis not present

## 2020-09-26 DIAGNOSIS — M150 Primary generalized (osteo)arthritis: Secondary | ICD-10-CM | POA: Diagnosis not present

## 2020-09-26 DIAGNOSIS — R5383 Other fatigue: Secondary | ICD-10-CM | POA: Diagnosis not present

## 2020-10-01 ENCOUNTER — Other Ambulatory Visit: Payer: Self-pay | Admitting: Internal Medicine

## 2020-10-01 DIAGNOSIS — I480 Paroxysmal atrial fibrillation: Secondary | ICD-10-CM | POA: Diagnosis not present

## 2020-10-01 DIAGNOSIS — N2581 Secondary hyperparathyroidism of renal origin: Secondary | ICD-10-CM | POA: Diagnosis not present

## 2020-10-01 DIAGNOSIS — E785 Hyperlipidemia, unspecified: Secondary | ICD-10-CM | POA: Diagnosis not present

## 2020-10-01 DIAGNOSIS — I129 Hypertensive chronic kidney disease with stage 1 through stage 4 chronic kidney disease, or unspecified chronic kidney disease: Secondary | ICD-10-CM | POA: Diagnosis not present

## 2020-10-01 DIAGNOSIS — D631 Anemia in chronic kidney disease: Secondary | ICD-10-CM | POA: Diagnosis not present

## 2020-10-01 DIAGNOSIS — N183 Chronic kidney disease, stage 3 unspecified: Secondary | ICD-10-CM | POA: Diagnosis not present

## 2020-10-01 NOTE — Telephone Encounter (Signed)
Pt last saw Francis Dowse, [place] on 04/20/20, last labs 01/05/20 Creat 1.66, age 87, weight 77.6kg, CrCl 36.36, based on CrCl pt is on appropriate dosage of Xarelto 15mg  QD.  Will refill rx.

## 2020-10-04 ENCOUNTER — Telehealth: Payer: Self-pay | Admitting: Physician Assistant

## 2020-10-04 NOTE — Telephone Encounter (Signed)
Returned patients phone call. No answer, LMOVM.

## 2020-10-04 NOTE — Telephone Encounter (Signed)
  1. Has your device fired? no  2. Is you device beeping? no  3. Are you experiencing draining or swelling at device site? no  4. Are you calling to see if we received your device transmission? no  5. Have you passed out? No  Patient called and said his machine from [employer] is still on back order and he is not going to be able to send a remote transmission. He does not know when it will be sent to him    Please route to Device Clinic Pool

## 2020-10-04 NOTE — Telephone Encounter (Signed)
Patient will contact Device Clinic when new monitor arrives. Patient will call if he has any issues or concerns about his device before he gets the new monitor and will be scheduled for an in-clinic check if needed.

## 2020-10-12 DIAGNOSIS — M7989 Other specified soft tissue disorders: Secondary | ICD-10-CM | POA: Diagnosis not present

## 2020-10-12 DIAGNOSIS — M150 Primary generalized (osteo)arthritis: Secondary | ICD-10-CM | POA: Diagnosis not present

## 2020-11-19 ENCOUNTER — Ambulatory Visit (INDEPENDENT_AMBULATORY_CARE_PROVIDER_SITE_OTHER): Payer: Medicare Other

## 2020-11-19 DIAGNOSIS — I495 Sick sinus syndrome: Secondary | ICD-10-CM | POA: Diagnosis not present

## 2020-11-19 LAB — CUP PACEART REMOTE DEVICE CHECK
Battery Remaining Longevity: 107 mo
Battery Voltage: 2.98 V
Brady Statistic AP VP Percent: 23.08 %
Brady Statistic AP VS Percent: 9.54 %
Brady Statistic AS VP Percent: 35.27 %
Brady Statistic AS VS Percent: 32.11 %
Brady Statistic RA Percent Paced: 31.94 %
Brady Statistic RV Percent Paced: 58.35 %
Date Time Interrogation Session: 20220320131130
Implantable Lead Implant Date: 20180808
Implantable Lead Implant Date: 20180808
Implantable Lead Location: 753859
Implantable Lead Location: 753860
Implantable Lead Model: 5076
Implantable Lead Model: 5076
Implantable Pulse Generator Implant Date: 20180808
Lead Channel Impedance Value: 285 Ohm
Lead Channel Impedance Value: 342 Ohm
Lead Channel Impedance Value: 418 Ohm
Lead Channel Impedance Value: 475 Ohm
Lead Channel Pacing Threshold Amplitude: 0.75 V
Lead Channel Pacing Threshold Amplitude: 0.875 V
Lead Channel Pacing Threshold Pulse Width: 0.4 ms
Lead Channel Pacing Threshold Pulse Width: 0.4 ms
Lead Channel Sensing Intrinsic Amplitude: 3.125 mV
Lead Channel Sensing Intrinsic Amplitude: 3.125 mV
Lead Channel Sensing Intrinsic Amplitude: 9.125 mV
Lead Channel Sensing Intrinsic Amplitude: 9.125 mV
Lead Channel Setting Pacing Amplitude: 1.75 V
Lead Channel Setting Pacing Amplitude: 2 V
Lead Channel Setting Pacing Pulse Width: 0.4 ms
Lead Channel Setting Sensing Sensitivity: 0.9 mV

## 2020-11-21 DIAGNOSIS — M0600 Rheumatoid arthritis without rheumatoid factor, unspecified site: Secondary | ICD-10-CM | POA: Diagnosis not present

## 2020-11-27 NOTE — Progress Notes (Signed)
Remote pacemaker transmission.   

## 2020-12-19 ENCOUNTER — Other Ambulatory Visit: Payer: Self-pay

## 2020-12-19 ENCOUNTER — Ambulatory Visit: Payer: Medicare Other | Admitting: Cardiovascular Disease

## 2020-12-19 ENCOUNTER — Encounter: Payer: Self-pay | Admitting: Cardiovascular Disease

## 2020-12-19 VITALS — BP 98/56 | HR 82 | Ht 66.0 in | Wt 165.4 lb

## 2020-12-19 DIAGNOSIS — I10 Essential (primary) hypertension: Secondary | ICD-10-CM

## 2020-12-19 MED ORDER — VALSARTAN 80 MG PO TABS: 80.0000 mg | ORAL_TABLET | Freq: Every day | ORAL | 3 refills | Status: DC

## 2020-12-19 NOTE — Patient Instructions (Signed)
Medication Instructions:  Your physician has recommended you make the following change in your medication:   STOP Diovan/HCTZ START Diovan 80mg  daily  *If you need a refill on your cardiac medications before your next appointment, please call your pharmacy*   Lab Work: none If you have labs (blood work) drawn today and your tests are completely normal, you will receive your results only by: MyChart Message (if you have MyChart) OR . A paper copy in the mail If you have any lab test that is abnormal or we need to change your treatment, we will call you to review the results.   Testing/Procedures: none   Follow-Up: At [facility], you and your health needs are our priority.  As part of our continuing mission to provide you with exceptional heart care, we have created designated Provider Care Teams.  These Care Teams include your primary Cardiologist (physician) and Advanced Practice Providers (APPs -  Physician Assistants and Nurse Practitioners) who all work together to provide you with the care you need, when you need it.  Your next appointment:   1 year(s)  The format for your next appointment:   In Person  Provider:   You may see [facility], MD or one of the following Advanced Practice Providers on your designated Care Team:    Kristeen Miss, PA-C  Vin Waterford, [place]
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
## 2021-02-18 ENCOUNTER — Ambulatory Visit (INDEPENDENT_AMBULATORY_CARE_PROVIDER_SITE_OTHER): Payer: Medicare Other

## 2021-02-18 DIAGNOSIS — I495 Sick sinus syndrome: Secondary | ICD-10-CM | POA: Diagnosis not present

## 2021-02-19 LAB — CUP PACEART REMOTE DEVICE CHECK
Battery Remaining Longevity: 100 mo
Battery Voltage: 2.98 V
Brady Statistic AP VP Percent: 9.59 %
Brady Statistic AP VS Percent: 24.62 %
Brady Statistic AS VP Percent: 10.07 %
Brady Statistic AS VS Percent: 55.73 %
Brady Statistic RA Percent Paced: 32.23 %
Brady Statistic RV Percent Paced: 19.66 %
Date Time Interrogation Session: 20220620014447
Implantable Lead Implant Date: 20180808
Implantable Lead Implant Date: 20180808
Implantable Lead Location: 753859
Implantable Lead Location: 753860
Implantable Lead Model: 5076
Implantable Lead Model: 5076
Implantable Pulse Generator Implant Date: 20180808
Lead Channel Impedance Value: 266 Ohm
Lead Channel Impedance Value: 304 Ohm
Lead Channel Impedance Value: 342 Ohm
Lead Channel Impedance Value: 418 Ohm
Lead Channel Pacing Threshold Amplitude: 0.875 V
Lead Channel Pacing Threshold Amplitude: 0.875 V
Lead Channel Pacing Threshold Pulse Width: 0.4 ms
Lead Channel Pacing Threshold Pulse Width: 0.4 ms
Lead Channel Sensing Intrinsic Amplitude: 2 mV
Lead Channel Sensing Intrinsic Amplitude: 2 mV
Lead Channel Sensing Intrinsic Amplitude: 7.75 mV
Lead Channel Sensing Intrinsic Amplitude: 7.75 mV
Lead Channel Setting Pacing Amplitude: 1.75 V
Lead Channel Setting Pacing Amplitude: 2 V
Lead Channel Setting Pacing Pulse Width: 0.4 ms
Lead Channel Setting Sensing Sensitivity: 0.9 mV

## 2021-03-11 NOTE — Progress Notes (Signed)
Remote pacemaker transmission.   

## 2021-03-25 ENCOUNTER — Other Ambulatory Visit: Payer: Self-pay | Admitting: Internal Medicine

## 2021-03-25 NOTE — Telephone Encounter (Signed)
Pt last saw Dr Elease Hashimoto 12/19/20, last labs 01/23/21 Creat 2.19, age 87, weight 75kg, CrCl 26.16, based on CrCl pt is on appropriate dosage of Xarelto 15mg  QD.  Will refill rx.

## 2021-04-09 ENCOUNTER — Telehealth: Payer: Self-pay | Admitting: Cardiovascular Disease

## 2021-04-09 NOTE — Telephone Encounter (Signed)
There should be no drug drug interactions between the Enbrel and his other medications.  Recommend he have a TB test before starting and not to have any live vaccines. Also can increase risk of infections.

## 2021-04-09 NOTE — Telephone Encounter (Signed)
Spoke with the patient and gave him advice from PharmD. Patient verbalized understanding.  He states that he is currently getting over COVID so he is going to contact his PCP to see how long he should wait until he starts Enbrel.

## 2021-04-09 NOTE — Telephone Encounter (Signed)
Pt c/o medication issue: 1. Name of Medication: Enbrel 50 mg shots  2. How are you currently taking this medication (dosage and times per day)? One a week 3. Are you having a reaction (difficulty breathing--STAT)?  No   4. What is your medication issue? Patient has not started this medication because he would like to know if it is ok to take.

## 2021-05-20 ENCOUNTER — Ambulatory Visit (INDEPENDENT_AMBULATORY_CARE_PROVIDER_SITE_OTHER): Payer: Medicare Other

## 2021-05-20 DIAGNOSIS — I495 Sick sinus syndrome: Secondary | ICD-10-CM | POA: Diagnosis not present

## 2021-05-21 LAB — CUP PACEART REMOTE DEVICE CHECK
Battery Remaining Longevity: 92 mo
Battery Voltage: 2.97 V
Brady Statistic AP VP Percent: 11.53 %
Brady Statistic AP VS Percent: 23.24 %
Brady Statistic AS VP Percent: 24.43 %
Brady Statistic AS VS Percent: 40.8 %
Brady Statistic RA Percent Paced: 33.85 %
Brady Statistic RV Percent Paced: 35.96 %
Date Time Interrogation Session: 20220919014410
Implantable Lead Implant Date: 20180808
Implantable Lead Implant Date: 20180808
Implantable Lead Location: 753859
Implantable Lead Location: 753860
Implantable Lead Model: 5076
Implantable Lead Model: 5076
Implantable Pulse Generator Implant Date: 20180808
Lead Channel Impedance Value: 266 Ohm
Lead Channel Impedance Value: 323 Ohm
Lead Channel Impedance Value: 342 Ohm
Lead Channel Impedance Value: 418 Ohm
Lead Channel Pacing Threshold Amplitude: 0.75 V
Lead Channel Pacing Threshold Amplitude: 1 V
Lead Channel Pacing Threshold Pulse Width: 0.4 ms
Lead Channel Pacing Threshold Pulse Width: 0.4 ms
Lead Channel Sensing Intrinsic Amplitude: 1.375 mV
Lead Channel Sensing Intrinsic Amplitude: 1.375 mV
Lead Channel Sensing Intrinsic Amplitude: 7 mV
Lead Channel Sensing Intrinsic Amplitude: 7 mV
Lead Channel Setting Pacing Amplitude: 2 V
Lead Channel Setting Pacing Amplitude: 2 V
Lead Channel Setting Pacing Pulse Width: 0.4 ms
Lead Channel Setting Sensing Sensitivity: 0.9 mV

## 2021-05-24 NOTE — Progress Notes (Signed)
Remote pacemaker transmission.   

## 2021-06-20 ENCOUNTER — Ambulatory Visit: Payer: Medicare Other | Admitting: Physician Assistant

## 2021-06-20 ENCOUNTER — Encounter: Payer: Self-pay | Admitting: Physician Assistant

## 2021-06-20 ENCOUNTER — Other Ambulatory Visit: Payer: Self-pay

## 2021-06-20 VITALS — BP 136/74 | HR 76 | Resp 18 | Ht 66.0 in | Wt 161.0 lb

## 2021-06-20 DIAGNOSIS — I495 Sick sinus syndrome: Secondary | ICD-10-CM

## 2021-06-20 DIAGNOSIS — I10 Essential (primary) hypertension: Secondary | ICD-10-CM

## 2021-06-20 DIAGNOSIS — Z950 Presence of cardiac pacemaker: Secondary | ICD-10-CM

## 2021-06-20 DIAGNOSIS — I480 Paroxysmal atrial fibrillation: Secondary | ICD-10-CM | POA: Diagnosis not present

## 2021-06-20 LAB — CUP PACEART INCLINIC DEVICE CHECK
Battery Remaining Longevity: 92 mo
Battery Voltage: 2.97 V
Brady Statistic AP VP Percent: 17.17 %
Brady Statistic AP VS Percent: 16.38 %
Brady Statistic AS VP Percent: 26.77 %
Brady Statistic AS VS Percent: 39.67 %
Brady Statistic RA Percent Paced: 32.54 %
Brady Statistic RV Percent Paced: 43.94 %
Date Time Interrogation Session: 20221020161248
Implantable Lead Implant Date: 20180808
Implantable Lead Implant Date: 20180808
Implantable Lead Location: 753859
Implantable Lead Location: 753860
Implantable Lead Model: 5076
Implantable Lead Model: 5076
Implantable Pulse Generator Implant Date: 20180808
Lead Channel Impedance Value: 285 Ohm
Lead Channel Impedance Value: 342 Ohm
Lead Channel Impedance Value: 380 Ohm
Lead Channel Impedance Value: 456 Ohm
Lead Channel Pacing Threshold Amplitude: 0.875 V
Lead Channel Pacing Threshold Amplitude: 1 V
Lead Channel Pacing Threshold Pulse Width: 0.4 ms
Lead Channel Pacing Threshold Pulse Width: 0.4 ms
Lead Channel Sensing Intrinsic Amplitude: 1.25 mV
Lead Channel Sensing Intrinsic Amplitude: 3.375 mV
Lead Channel Sensing Intrinsic Amplitude: 7.25 mV
Lead Channel Sensing Intrinsic Amplitude: 7.75 mV
Lead Channel Setting Pacing Amplitude: 2 V
Lead Channel Setting Pacing Amplitude: 2 V
Lead Channel Setting Pacing Pulse Width: 0.4 ms
Lead Channel Setting Sensing Sensitivity: 0.9 mV

## 2021-06-20 NOTE — Progress Notes (Signed)
Cardiology Office Note Date:  06/20/2021  Patient ID:  Mario Fisher, Mario Fisher [birth year], MRN [id number] PCP:  Soundra Pilon, FNP  Cardiologist:  Dr. Elease Hashimoto EP: Dr. Johney Frame    Chief Complaint: annual device visit  History of Present Illness: Mario Fisher is a 87 y.o. male with history of HTN, HLD, AFib, stroke symptomatic bradycardia w/PPM, CKD (III), skin ca.  He comes in today to be seen for Dr. Johney Frame, last seen by him via tele health Aug 2020, doing well, normal remotes, tolerating xarelto, no changes were made.  I saw him Aug 2021 He is doing well. Very active, still working on cars, doing yard work, no exertional intolerances No CP, palpitations or cardiac awareness.   No SOB, DOE No dizzy spells, near syncope or syncope No bleeding or signs of bleeding No changes were made  He saw Dr. Elease Hashimoto 05/07/22his son recently had passed way after an MI, still trying to work past his grieving.  BP on the low side and his HCTZ stopped with plans to follow BP with his PMD at an upcoming visit.  TODAY He continues to do well. Denies any cardiac awareness, no CP or palpitations No SOB He worries quite a bit abut his BP and takes his diovan fairly randomly and at different times of the day depending on his BP.  He is back on va;sartan/HCTZ and generally his BPs look OK with some higher and lower numbers, but difficult to know how it is working with his metgod of management He has not had any dizzy spells, no near syncope or syncope.  He mentions since seeing Dr. Elease Hashimoto has been diagnosed with stage III CKD and seeing a nephrologist (he thinks the name is Dr. Thedore Mins) and found with skin ca on his scalp,, pening to start a cream for that   No bleeding or signs of bleeding  Device information MDT dual chamber PPM implanted 04/08/2017  Past Medical History:  Diagnosis Date   Arthritis    "back" (04/08/2017)   Atrial fibrillation (HCC)    B12 deficiency anemia    BPH (benign  prostatic hypertrophy)    Chronic anticoagulation    on Pradaxa   Chronic lower back pain    Glaucoma, both eyes    Glucose intolerance (impaired glucose tolerance)    History of blood transfusion    "I'm thinking they gave me blood when I had the gallbladder OR" (04/08/2017)   History of gout    History of hiatal hernia    Hyperlipidemia    Hypertension    OSA on CPAP    Presence of permanent cardiac pacemaker    Stroke [facility])    remote CVA noted on MRI; "silent stroke; didn't know when it happened" (04/08/2017)    Past Surgical History:  Procedure Laterality Date   CARDIOVERSION  04/08/2017   Procedure: Cardioversion;  Surgeon: Hillis Range, MD;  Location: [facility] INVASIVE CV LAB;  Service: Cardiovascular;;   CARDIOVERSION N/A 07/27/2018   Procedure: CARDIOVERSION;  Surgeon: Lewayne Bunting, MD;  Location: [facility] ENDOSCOPY;  Service: Cardiovascular;  Laterality: N/A;   CATARACT EXTRACTION W/ INTRAOCULAR LENS  IMPLANT, BILATERAL Bilateral    CHOLECYSTECTOMY OPEN     PACEMAKER IMPLANT N/A 04/08/2017   Medtronic Azure XT dual-chamber MRI conditional pacemaker for symptomatic bradycardia by Dr Johney Frame   US ECHOCARDIOGRAPHY  02/25/2005   ef 55-60%    Current Outpatient Medications  Medication Sig Dispense Refill   acetaminophen (TYLENOL) 500 MG  tablet Take 650 mg by mouth every 6 (six) hours as needed for moderate pain or headache.      allopurinol (ZYLOPRIM) 100 MG tablet Take 100 mg by mouth 2 (two) times daily.     Cholecalciferol (VITAMIN D-3) 5000 UNITS TABS Take 5,000 Units by mouth daily.      cyanocobalamin (,VITAMIN B-12,) 1000 MCG/ML injection Inject 1,000 mcg into the muscle every 30 (thirty) days.     dorzolamide (TRUSOPT) 2 % ophthalmic solution Place 1 drop into both eyes 2 (two) times daily.  12   Glucosamine-Chondroit-Vit C-Mn (GLUCOSAMINE CHONDR 1500 COMPLX PO) Take 1 tablet by mouth daily.     Multiple Vitamins-Minerals (MULTIVITAMIN ADULTS 50+ PO) Take 1 tablet by mouth daily.      Omega-3 Fatty Acids (FISH OIL) 1200 MG CAPS Take 1,200-2,400 mg by mouth See admin instructions. Take 2400 mg by mouth in the morning and take 1200 mg by mouth at bedtime     predniSONE (DELTASONE) 5 MG tablet Take 10-15 mg by mouth as directed. Currently taking 2.5 tablet for 2 weeks  Then 2 tablets for 4 weeks     Propylene Glycol 0.6 % SOLN Place 1 drop into both eyes 2 (two) times daily.     Rivaroxaban (XARELTO) 15 MG TABS tablet TAKE 1 TABLET BY MOUTH ONCE DAILY WITH SUPPER 90 tablet 1   tamsulosin (FLOMAX) 0.4 MG CAPS capsule Take 0.4 mg by mouth daily.     valsartan (DIOVAN) 80 MG tablet Take 1 tablet (80 mg total) by mouth daily. 90 tablet 3   No current facility-administered medications for this visit.    Allergies:   Patient has no known allergies.   Social History:  The patient  reports that he quit smoking about 52 years ago. His smoking use included cigars. He has never used smokeless tobacco. He reports that he does not drink alcohol and does not use drugs.   Family History:  The patient's family history includes Arrhythmia in his mother; Heart attack in his father; Heart disease in his sister; Hypertension in his brother and father; Stroke in his father.  ROS:  Please see the history of present illness. All other systems are reviewed and otherwise negative.   PHYSICAL EXAM:  VS:  There were no vitals taken for this visit. BMI: There is no height or weight on file to calculate BMI. Well nourished, well developed, in no acute distress  HEENT: normocephalic, atraumatic  Neck: no JVD, carotid bruits or masses Cardiac: RRR; no significant murmurs, no rubs, or gallops Lungs:  CTA b/l, no wheezing, rhonchi or rales  Abd: soft, nontender MS: no deformity or atrophy Ext: no edema  Skin: warm and dry, no rash Neuro:  No gross deficits appreciated Psych: euthymic mood, full affect  PPM site is stable, no tethering or discomfort   EKG:  Not done today  PPM interrogation  done today and reviewed by myself Battery and lead measurements are good AF burden is <0.1% 3 NSVT episodes of 3, 8 , one second durations (since Aug 2021)   10/12/2018: TTE IMPRESSIONS  1. The left ventricle has normal systolic function of 60-65%. The cavity  size was normal. There is mild concentric left ventricular hypertrophy.  Left ventricular diastolic Doppler parameters are consistent with  pseudonormal Elevated left ventricular  end-diastolic pressure.   2. The right ventricle has normal systolic function. The cavity was  normal. There is no increase in right ventricular wall thickness. Right  ventricular systolic  pressure is moderately elevated with an estimated  pressure of 41 mmHg.   3. Left atrial size was mildly dilated.   4. The tricuspid valve is normal in structure. Tricuspid valve  regurgitation is moderate.   5. The aortic valve is tricuspid There is mild thickening and mild  calcification of the aortic valve. Aortic valve regurgitation is trivial  by color flow Doppler.   6. The pulmonic valve was normal in structure. Pulmonic valve  regurgitation is mild by color flow Doppler.   7. Right atrial size was mildly dilated.   8. The mitral valve is normal in structure.    Recent Labs: No results found for requested labs within last 8760 hours.  No results found for requested labs within last 8760 hours.   CrCl cannot be calculated (Patient's most recent lab result is older than the maximum 21 days allowed.).   Wt Readings from Last 3 Encounters:  12/19/20 165 lb 6.4 oz (75 kg)  04/20/20 171 lb (77.6 kg)  12/21/19 170 lb (77.1 kg)     Other studies reviewed: Additional studies/records reviewed today include: summarized above  ASSESSMENT AND PLAN:  1. PPM     Intact function, no programming changes made  2. Paroxysmal AFib     CHA2DS2Vasc is 5, on Xarelto     very low burden     Will get last labs from his PMD   3. HTN     Generally looks OK      Discussed importance of taking his medicine at the same time every day, he sees nephrology next week, will defer BP management to them   Disposition: remotes as usual, in clinic with EP in a year, sooner if needed   Current medicines are reviewed at length with the patient today.  The patient did not have any concerns regarding medicines.  Norma Fredrickson, PA-C 06/20/2021 6:39 AM     [facility] [address] [place] [address] [phone] (office)  [phone] (fax)

## 2021-06-20 NOTE — Patient Instructions (Signed)
Medication Instructions:   Your physician recommends that you continue on your current medications as directed. Please refer to the Current Medication list given to you today.   *If you need a refill on your cardiac medications before your next appointment, please call your pharmacy*   Lab Work:  NONE ORDERED  TODAY   If you have labs (blood work) drawn today and your tests are completely normal, you will receive your results only by: MyChart Message (if you have MyChart) OR A paper copy in the mail If you have any lab test that is abnormal or we need to change your treatment, we will call you to review the results.   Testing/Procedures: NONE ORDERED  TODAY     Follow-Up: At [facility], you and your health needs are our priority.  As part of our continuing mission to provide you with exceptional heart care, we have created designated Provider Care Teams.  These Care Teams include your primary Cardiologist (physician) and Advanced Practice Providers (APPs -  Physician Assistants and Nurse Practitioners) who all work together to provide you with the care you need, when you need it.  We recommend signing up for the patient portal called "MyChart".  Sign up information is provided on this After Visit Summary.  MyChart is used to connect with patients for Virtual Visits (Telemedicine).  Patients are able to view lab/test results, encounter notes, upcoming appointments, etc.  Non-urgent messages can be sent to your provider as well.   To learn more about what you can do with MyChart, go to [email or web address].    Your next appointment:   1 year(s)  The format for your next appointment:   In Person  Provider:   You will see one of the following Advanced Practice Providers on your designated Care Team:   Renee Ursuy, PA-C       Other Instructions  

## 2021-08-19 ENCOUNTER — Ambulatory Visit (INDEPENDENT_AMBULATORY_CARE_PROVIDER_SITE_OTHER): Payer: Medicare Other

## 2021-08-19 DIAGNOSIS — I495 Sick sinus syndrome: Secondary | ICD-10-CM | POA: Diagnosis not present

## 2021-08-19 LAB — CUP PACEART REMOTE DEVICE CHECK
Battery Remaining Longevity: 87 mo
Battery Voltage: 2.97 V
Brady Statistic AP VP Percent: 23.68 %
Brady Statistic AP VS Percent: 22.22 %
Brady Statistic AS VP Percent: 17.88 %
Brady Statistic AS VS Percent: 36.22 %
Brady Statistic RA Percent Paced: 45.22 %
Brady Statistic RV Percent Paced: 41.56 %
Date Time Interrogation Session: 20221219004343
Implantable Lead Implant Date: 20180808
Implantable Lead Implant Date: 20180808
Implantable Lead Location: 753859
Implantable Lead Location: 753860
Implantable Lead Model: 5076
Implantable Lead Model: 5076
Implantable Pulse Generator Implant Date: 20180808
Lead Channel Impedance Value: 266 Ohm
Lead Channel Impedance Value: 323 Ohm
Lead Channel Impedance Value: 342 Ohm
Lead Channel Impedance Value: 437 Ohm
Lead Channel Pacing Threshold Amplitude: 0.875 V
Lead Channel Pacing Threshold Amplitude: 0.875 V
Lead Channel Pacing Threshold Pulse Width: 0.4 ms
Lead Channel Pacing Threshold Pulse Width: 0.4 ms
Lead Channel Sensing Intrinsic Amplitude: 2 mV
Lead Channel Sensing Intrinsic Amplitude: 2 mV
Lead Channel Sensing Intrinsic Amplitude: 7.125 mV
Lead Channel Sensing Intrinsic Amplitude: 7.125 mV
Lead Channel Setting Pacing Amplitude: 1.75 V
Lead Channel Setting Pacing Amplitude: 2 V
Lead Channel Setting Pacing Pulse Width: 0.4 ms
Lead Channel Setting Sensing Sensitivity: 0.9 mV

## 2021-08-28 ENCOUNTER — Other Ambulatory Visit: Payer: Self-pay

## 2021-08-28 MED ORDER — RIVAROXABAN 15 MG PO TABS: 15.0000 mg | ORAL_TABLET | Freq: Every day | ORAL | 1 refills | Status: DC

## 2021-08-28 NOTE — Progress Notes (Signed)
Remote pacemaker transmission.   

## 2021-08-28 NOTE — Addendum Note (Signed)
Addended by: Satira Sark on: 08/28/2021 02:08 PM   Modules accepted: Orders

## 2021-08-28 NOTE — Telephone Encounter (Signed)
Pt last saw Francis Dowse, [place], last labs 01/23/21 Creat 2.19, age 87, weight 73kg, CrCl 25.46, based on CrCl pt is on appropriate dosage of Xarelto 15mg  QD.  Will refill rx.

## 2021-10-02 DIAGNOSIS — H401132 Primary open-angle glaucoma, bilateral, moderate stage: Secondary | ICD-10-CM | POA: Diagnosis not present

## 2021-10-02 DIAGNOSIS — H04123 Dry eye syndrome of bilateral lacrimal glands: Secondary | ICD-10-CM | POA: Diagnosis not present

## 2021-10-25 ENCOUNTER — Telehealth: Payer: Self-pay

## 2021-10-25 NOTE — Telephone Encounter (Signed)
Successful telephone encounter to patient to follow up on BP and HR home recordings and to assist patient with sending manual PPM remote transmission per request of Francis Dowse, PA. Routing for review.  Home BP and HR reports as follows: 10/24/21 Symptoms of chills 95/68  98 10/23/21 Symptoms of chills 92/59 85 99/60 93 99/66 94 114/81 97 107/69 61 112/68 58 10/22/20  No Symptoms 147/87 67 141/83 63 10/21/21  No Symptoms 136/86 59 127/80 65

## 2021-11-02 ENCOUNTER — Encounter (HOSPITAL_BASED_OUTPATIENT_CLINIC_OR_DEPARTMENT_OTHER): Payer: Self-pay | Admitting: Emergency Medicine

## 2021-11-02 ENCOUNTER — Other Ambulatory Visit: Payer: Self-pay

## 2021-11-02 ENCOUNTER — Emergency Department (HOSPITAL_BASED_OUTPATIENT_CLINIC_OR_DEPARTMENT_OTHER): Payer: Medicare Other

## 2021-11-02 ENCOUNTER — Inpatient Hospital Stay (HOSPITAL_BASED_OUTPATIENT_CLINIC_OR_DEPARTMENT_OTHER)
Admission: EM | Admit: 2021-11-02 | Discharge: 2021-11-06 | DRG: 392 | Disposition: A | Discharge status: Home / Self Care | Payer: Medicare Other | Attending: Internal Medicine | Admitting: Internal Medicine

## 2021-11-02 DIAGNOSIS — R7401 Elevation of levels of liver transaminase levels: Secondary | ICD-10-CM | POA: Diagnosis not present

## 2021-11-02 DIAGNOSIS — Z20822 Contact with and (suspected) exposure to covid-19: Secondary | ICD-10-CM | POA: Diagnosis present

## 2021-11-02 DIAGNOSIS — R079 Chest pain, unspecified: Secondary | ICD-10-CM

## 2021-11-02 DIAGNOSIS — M459 Ankylosing spondylitis of unspecified sites in spine: Secondary | ICD-10-CM | POA: Diagnosis not present

## 2021-11-02 DIAGNOSIS — G8929 Other chronic pain: Secondary | ICD-10-CM | POA: Diagnosis present

## 2021-11-02 DIAGNOSIS — Z9049 Acquired absence of other specified parts of digestive tract: Secondary | ICD-10-CM | POA: Diagnosis not present

## 2021-11-02 DIAGNOSIS — D649 Anemia, unspecified: Secondary | ICD-10-CM

## 2021-11-02 DIAGNOSIS — M545 Low back pain, unspecified: Secondary | ICD-10-CM | POA: Diagnosis present

## 2021-11-02 DIAGNOSIS — E876 Hypokalemia: Secondary | ICD-10-CM

## 2021-11-02 DIAGNOSIS — N3289 Other specified disorders of bladder: Secondary | ICD-10-CM | POA: Diagnosis not present

## 2021-11-02 DIAGNOSIS — I4891 Unspecified atrial fibrillation: Secondary | ICD-10-CM | POA: Diagnosis present

## 2021-11-02 DIAGNOSIS — Z8673 Personal history of transient ischemic attack (TIA), and cerebral infarction without residual deficits: Secondary | ICD-10-CM

## 2021-11-02 DIAGNOSIS — R7881 Bacteremia: Secondary | ICD-10-CM | POA: Diagnosis not present

## 2021-11-02 DIAGNOSIS — K5900 Constipation, unspecified: Secondary | ICD-10-CM | POA: Diagnosis not present

## 2021-11-02 DIAGNOSIS — E871 Hypo-osmolality and hyponatremia: Secondary | ICD-10-CM | POA: Diagnosis not present

## 2021-11-02 DIAGNOSIS — R143 Flatulence: Secondary | ICD-10-CM | POA: Diagnosis not present

## 2021-11-02 DIAGNOSIS — G4733 Obstructive sleep apnea (adult) (pediatric): Secondary | ICD-10-CM | POA: Diagnosis not present

## 2021-11-02 DIAGNOSIS — R6510 Systemic inflammatory response syndrome (SIRS) of non-infectious origin without acute organ dysfunction: Secondary | ICD-10-CM | POA: Diagnosis not present

## 2021-11-02 DIAGNOSIS — Z823 Family history of stroke: Secondary | ICD-10-CM

## 2021-11-02 DIAGNOSIS — I129 Hypertensive chronic kidney disease with stage 1 through stage 4 chronic kidney disease, or unspecified chronic kidney disease: Secondary | ICD-10-CM | POA: Diagnosis not present

## 2021-11-02 DIAGNOSIS — E785 Hyperlipidemia, unspecified: Secondary | ICD-10-CM | POA: Diagnosis not present

## 2021-11-02 DIAGNOSIS — K2980 Duodenitis without bleeding: Secondary | ICD-10-CM | POA: Diagnosis not present

## 2021-11-02 DIAGNOSIS — Z87891 Personal history of nicotine dependence: Secondary | ICD-10-CM

## 2021-11-02 DIAGNOSIS — I480 Paroxysmal atrial fibrillation: Secondary | ICD-10-CM | POA: Diagnosis not present

## 2021-11-02 DIAGNOSIS — Z950 Presence of cardiac pacemaker: Secondary | ICD-10-CM

## 2021-11-02 DIAGNOSIS — K551 Chronic vascular disorders of intestine: Secondary | ICD-10-CM | POA: Diagnosis present

## 2021-11-02 DIAGNOSIS — R7989 Other specified abnormal findings of blood chemistry: Secondary | ICD-10-CM | POA: Diagnosis not present

## 2021-11-02 DIAGNOSIS — K269 Duodenal ulcer, unspecified as acute or chronic, without hemorrhage or perforation: Secondary | ICD-10-CM | POA: Diagnosis not present

## 2021-11-02 DIAGNOSIS — I495 Sick sinus syndrome: Secondary | ICD-10-CM | POA: Diagnosis not present

## 2021-11-02 DIAGNOSIS — N1831 Chronic kidney disease, stage 3a: Secondary | ICD-10-CM

## 2021-11-02 DIAGNOSIS — K5730 Diverticulosis of large intestine without perforation or abscess without bleeding: Secondary | ICD-10-CM | POA: Diagnosis not present

## 2021-11-02 DIAGNOSIS — M069 Rheumatoid arthritis, unspecified: Secondary | ICD-10-CM | POA: Diagnosis not present

## 2021-11-02 DIAGNOSIS — B9620 Unspecified Escherichia coli [E. coli] as the cause of diseases classified elsewhere: Secondary | ICD-10-CM

## 2021-11-02 DIAGNOSIS — D631 Anemia in chronic kidney disease: Secondary | ICD-10-CM | POA: Diagnosis present

## 2021-11-02 DIAGNOSIS — R945 Abnormal results of liver function studies: Secondary | ICD-10-CM | POA: Diagnosis not present

## 2021-11-02 DIAGNOSIS — N400 Enlarged prostate without lower urinary tract symptoms: Secondary | ICD-10-CM

## 2021-11-02 DIAGNOSIS — R509 Fever, unspecified: Secondary | ICD-10-CM

## 2021-11-02 DIAGNOSIS — D519 Vitamin B12 deficiency anemia, unspecified: Secondary | ICD-10-CM | POA: Diagnosis present

## 2021-11-02 DIAGNOSIS — Z7901 Long term (current) use of anticoagulants: Secondary | ICD-10-CM

## 2021-11-02 DIAGNOSIS — R000 Tachycardia, unspecified: Secondary | ICD-10-CM | POA: Diagnosis not present

## 2021-11-02 DIAGNOSIS — I10 Essential (primary) hypertension: Secondary | ICD-10-CM | POA: Diagnosis present

## 2021-11-02 DIAGNOSIS — Z888 Allergy status to other drugs, medicaments and biological substances status: Secondary | ICD-10-CM

## 2021-11-02 DIAGNOSIS — I7090 Unspecified atherosclerosis: Secondary | ICD-10-CM

## 2021-11-02 DIAGNOSIS — H409 Unspecified glaucoma: Secondary | ICD-10-CM | POA: Diagnosis present

## 2021-11-02 DIAGNOSIS — I517 Cardiomegaly: Secondary | ICD-10-CM | POA: Diagnosis not present

## 2021-11-02 DIAGNOSIS — Z79899 Other long term (current) drug therapy: Secondary | ICD-10-CM

## 2021-11-02 DIAGNOSIS — Z8249 Family history of ischemic heart disease and other diseases of the circulatory system: Secondary | ICD-10-CM

## 2021-11-02 LAB — CBC WITH DIFFERENTIAL/PLATELET
Abs Immature Granulocytes: 0.14 10*3/uL — ABNORMAL HIGH (ref 0.00–0.07)
Basophils Absolute: 0 10*3/uL (ref 0.0–0.1)
Basophils Relative: 0 %
Eosinophils Absolute: 0 10*3/uL (ref 0.0–0.5)
Eosinophils Relative: 0 %
HCT: 34.1 % — ABNORMAL LOW (ref 39.0–52.0)
Hemoglobin: 11.6 g/dL — ABNORMAL LOW (ref 13.0–17.0)
Immature Granulocytes: 1 %
Lymphocytes Relative: 3 %
Lymphs Abs: 0.4 10*3/uL — ABNORMAL LOW (ref 0.7–4.0)
MCH: 32.1 pg (ref 26.0–34.0)
MCHC: 34 g/dL (ref 30.0–36.0)
MCV: 94.5 fL (ref 80.0–100.0)
Monocytes Absolute: 0.8 10*3/uL (ref 0.1–1.0)
Monocytes Relative: 6 %
Neutro Abs: 12.4 10*3/uL — ABNORMAL HIGH (ref 1.7–7.7)
Neutrophils Relative %: 90 %
Platelets: 175 10*3/uL (ref 150–400)
RBC: 3.61 MIL/uL — ABNORMAL LOW (ref 4.22–5.81)
RDW: 15.2 % (ref 11.5–15.5)
WBC: 13.8 10*3/uL — ABNORMAL HIGH (ref 4.0–10.5)
nRBC: 0 % (ref 0.0–0.2)

## 2021-11-02 MED ORDER — ACETAMINOPHEN 325 MG PO TABS
650.0000 mg | ORAL_TABLET | Freq: Once | ORAL | Status: AC
  Administered 2021-11-02: 650 mg via ORAL
  Filled 2021-11-02: qty 2

## 2021-11-02 NOTE — ED Triage Notes (Signed)
Reports central CP and chills that started this evening around 1630.  Reports he's been having spells like this with back pain, low grade fever, and vomiting.  Last episode was 11 days ago.  Does endorse urinating more often. ?

## 2021-11-03 ENCOUNTER — Emergency Department (HOSPITAL_BASED_OUTPATIENT_CLINIC_OR_DEPARTMENT_OTHER): Payer: Medicare Other

## 2021-11-03 ENCOUNTER — Encounter (HOSPITAL_BASED_OUTPATIENT_CLINIC_OR_DEPARTMENT_OTHER): Payer: Self-pay | Admitting: Radiology

## 2021-11-03 ENCOUNTER — Inpatient Hospital Stay (HOSPITAL_COMMUNITY): Payer: Medicare Other

## 2021-11-03 DIAGNOSIS — N400 Enlarged prostate without lower urinary tract symptoms: Secondary | ICD-10-CM

## 2021-11-03 DIAGNOSIS — I480 Paroxysmal atrial fibrillation: Secondary | ICD-10-CM | POA: Diagnosis present

## 2021-11-03 DIAGNOSIS — R7881 Bacteremia: Secondary | ICD-10-CM | POA: Diagnosis present

## 2021-11-03 DIAGNOSIS — K551 Chronic vascular disorders of intestine: Secondary | ICD-10-CM | POA: Diagnosis present

## 2021-11-03 DIAGNOSIS — R7989 Other specified abnormal findings of blood chemistry: Secondary | ICD-10-CM

## 2021-11-03 DIAGNOSIS — I7090 Unspecified atherosclerosis: Secondary | ICD-10-CM

## 2021-11-03 DIAGNOSIS — B9620 Unspecified Escherichia coli [E. coli] as the cause of diseases classified elsewhere: Secondary | ICD-10-CM | POA: Diagnosis present

## 2021-11-03 DIAGNOSIS — R079 Chest pain, unspecified: Secondary | ICD-10-CM

## 2021-11-03 DIAGNOSIS — I495 Sick sinus syndrome: Secondary | ICD-10-CM | POA: Diagnosis present

## 2021-11-03 DIAGNOSIS — M459 Ankylosing spondylitis of unspecified sites in spine: Secondary | ICD-10-CM | POA: Diagnosis present

## 2021-11-03 DIAGNOSIS — D649 Anemia, unspecified: Secondary | ICD-10-CM

## 2021-11-03 DIAGNOSIS — N1831 Chronic kidney disease, stage 3a: Secondary | ICD-10-CM

## 2021-11-03 DIAGNOSIS — D631 Anemia in chronic kidney disease: Secondary | ICD-10-CM | POA: Diagnosis present

## 2021-11-03 DIAGNOSIS — M069 Rheumatoid arthritis, unspecified: Secondary | ICD-10-CM

## 2021-11-03 DIAGNOSIS — K5900 Constipation, unspecified: Secondary | ICD-10-CM | POA: Diagnosis not present

## 2021-11-03 DIAGNOSIS — R6510 Systemic inflammatory response syndrome (SIRS) of non-infectious origin without acute organ dysfunction: Secondary | ICD-10-CM | POA: Diagnosis present

## 2021-11-03 DIAGNOSIS — E876 Hypokalemia: Secondary | ICD-10-CM | POA: Diagnosis not present

## 2021-11-03 DIAGNOSIS — R143 Flatulence: Secondary | ICD-10-CM | POA: Diagnosis not present

## 2021-11-03 DIAGNOSIS — I129 Hypertensive chronic kidney disease with stage 1 through stage 4 chronic kidney disease, or unspecified chronic kidney disease: Secondary | ICD-10-CM | POA: Diagnosis present

## 2021-11-03 DIAGNOSIS — E785 Hyperlipidemia, unspecified: Secondary | ICD-10-CM | POA: Diagnosis present

## 2021-11-03 DIAGNOSIS — K2980 Duodenitis without bleeding: Secondary | ICD-10-CM | POA: Diagnosis present

## 2021-11-03 DIAGNOSIS — D519 Vitamin B12 deficiency anemia, unspecified: Secondary | ICD-10-CM | POA: Diagnosis present

## 2021-11-03 DIAGNOSIS — G4733 Obstructive sleep apnea (adult) (pediatric): Secondary | ICD-10-CM | POA: Diagnosis present

## 2021-11-03 DIAGNOSIS — E871 Hypo-osmolality and hyponatremia: Secondary | ICD-10-CM | POA: Diagnosis present

## 2021-11-03 DIAGNOSIS — K269 Duodenal ulcer, unspecified as acute or chronic, without hemorrhage or perforation: Secondary | ICD-10-CM | POA: Diagnosis present

## 2021-11-03 DIAGNOSIS — Z20822 Contact with and (suspected) exposure to covid-19: Secondary | ICD-10-CM | POA: Diagnosis present

## 2021-11-03 DIAGNOSIS — G8929 Other chronic pain: Secondary | ICD-10-CM | POA: Diagnosis present

## 2021-11-03 LAB — COMPREHENSIVE METABOLIC PANEL
ALT: 337 U/L — ABNORMAL HIGH (ref 0–44)
AST: 416 U/L — ABNORMAL HIGH (ref 15–41)
Albumin: 3.7 g/dL (ref 3.5–5.0)
Alkaline Phosphatase: 303 U/L — ABNORMAL HIGH (ref 38–126)
Anion gap: 10 (ref 5–15)
BUN: 21 mg/dL (ref 8–23)
CO2: 21 mmol/L — ABNORMAL LOW (ref 22–32)
Calcium: 9.3 mg/dL (ref 8.9–10.3)
Chloride: 100 mmol/L (ref 98–111)
Creatinine, Ser: 1.3 mg/dL — ABNORMAL HIGH (ref 0.61–1.24)
GFR, Estimated: 54 mL/min — ABNORMAL LOW (ref >60)
Glucose, Bld: 141 mg/dL — ABNORMAL HIGH (ref 70–99)
Potassium: 3.9 mmol/L (ref 3.5–5.1)
Sodium: 131 mmol/L — ABNORMAL LOW (ref 135–145)
Total Bilirubin: 3.8 mg/dL — ABNORMAL HIGH (ref 0.3–1.2)
Total Protein: 6.8 g/dL (ref 6.5–8.1)

## 2021-11-03 LAB — LIPASE, BLOOD: Lipase: 30 U/L (ref 11–51)

## 2021-11-03 LAB — URINALYSIS, ROUTINE W REFLEX MICROSCOPIC
Glucose, UA: NEGATIVE mg/dL
Hgb urine dipstick: NEGATIVE
Ketones, ur: NEGATIVE mg/dL
Leukocytes,Ua: NEGATIVE
Nitrite: NEGATIVE
Protein, ur: 30 mg/dL — AB
Specific Gravity, Urine: 1.02 (ref 1.005–1.030)
pH: 7.5 (ref 5.0–8.0)

## 2021-11-03 LAB — HEPATITIS PANEL, ACUTE
HCV Ab: NONREACTIVE
Hep A IgM: NONREACTIVE
Hep B C IgM: NONREACTIVE
Hepatitis B Surface Ag: NONREACTIVE

## 2021-11-03 LAB — URINALYSIS, MICROSCOPIC (REFLEX)

## 2021-11-03 LAB — RESP PANEL BY RT-PCR (FLU A&B, COVID) ARPGX2
Influenza A by PCR: NEGATIVE
Influenza B by PCR: NEGATIVE
SARS Coronavirus 2 by RT PCR: NEGATIVE

## 2021-11-03 LAB — TROPONIN I (HIGH SENSITIVITY)
Troponin I (High Sensitivity): 27 ng/L — ABNORMAL HIGH (ref <18)
Troponin I (High Sensitivity): 28 ng/L — ABNORMAL HIGH (ref <18)

## 2021-11-03 LAB — BILIRUBIN, FRACTIONATED(TOT/DIR/INDIR)
Bilirubin, Direct: 3.3 mg/dL — ABNORMAL HIGH (ref 0.0–0.2)
Indirect Bilirubin: 2 mg/dL — ABNORMAL HIGH (ref 0.3–0.9)
Total Bilirubin: 5.3 mg/dL — ABNORMAL HIGH (ref 0.3–1.2)

## 2021-11-03 LAB — LACTIC ACID, PLASMA: Lactic Acid, Venous: 1.3 mmol/L (ref 0.5–1.9)

## 2021-11-03 MED ORDER — TAMSULOSIN HCL 0.4 MG PO CAPS
0.4000 mg | ORAL_CAPSULE | Freq: Every day | ORAL | Status: DC
  Administered 2021-11-03 – 2021-11-06 (×4): 0.4 mg via ORAL
  Filled 2021-11-03 (×4): qty 1

## 2021-11-03 MED ORDER — METRONIDAZOLE 500 MG/100ML IV SOLN
500.0000 mg | Freq: Two times a day (BID) | INTRAVENOUS | Status: DC
  Administered 2021-11-03 – 2021-11-06 (×6): 500 mg via INTRAVENOUS
  Filled 2021-11-03 (×6): qty 100

## 2021-11-03 MED ORDER — IRBESARTAN 150 MG PO TABS
75.0000 mg | ORAL_TABLET | Freq: Every day | ORAL | Status: DC
  Administered 2021-11-04 – 2021-11-06 (×3): 75 mg via ORAL
  Filled 2021-11-03 (×3): qty 1

## 2021-11-03 MED ORDER — PROPYLENE GLYCOL 0.6 % OP SOLN: 1.0000 [drp] | Freq: Two times a day (BID) | OPHTHALMIC | Status: DC

## 2021-11-03 MED ORDER — DORZOLAMIDE HCL 2 % OP SOLN
1.0000 [drp] | Freq: Two times a day (BID) | OPHTHALMIC | Status: DC
  Administered 2021-11-03 – 2021-11-06 (×6): 1 [drp] via OPHTHALMIC
  Filled 2021-11-03: qty 10

## 2021-11-03 MED ORDER — HYDROCODONE-ACETAMINOPHEN 5-325 MG PO TABS: 1.0000 | ORAL_TABLET | ORAL | Status: DC | PRN

## 2021-11-03 MED ORDER — SODIUM CHLORIDE 0.9 % IV SOLN
2.0000 g | INTRAVENOUS | Status: DC
  Administered 2021-11-03 – 2021-11-05 (×3): 2 g via INTRAVENOUS
  Filled 2021-11-03 (×5): qty 20

## 2021-11-03 MED ORDER — ONDANSETRON HCL 4 MG/2ML IJ SOLN
4.0000 mg | Freq: Four times a day (QID) | INTRAMUSCULAR | Status: DC | PRN
Start: 2021-11-03 — End: 2021-11-06

## 2021-11-03 MED ORDER — ACETAMINOPHEN 325 MG PO TABS: 650.0000 mg | ORAL_TABLET | Freq: Four times a day (QID) | ORAL | Status: DC | PRN

## 2021-11-03 MED ORDER — RIVAROXABAN 15 MG PO TABS
15.0000 mg | ORAL_TABLET | Freq: Every day | ORAL | Status: DC
  Administered 2021-11-03 – 2021-11-05 (×3): 15 mg via ORAL
  Filled 2021-11-03 (×3): qty 1

## 2021-11-03 MED ORDER — POLYVINYL ALCOHOL 1.4 % OP SOLN
1.0000 [drp] | Freq: Two times a day (BID) | OPHTHALMIC | Status: DC
Start: 2021-11-03 — End: 2021-11-06
  Administered 2021-11-03 – 2021-11-06 (×6): 1 [drp] via OPHTHALMIC
  Filled 2021-11-03: qty 15

## 2021-11-03 MED ORDER — OMEGA-3-ACID ETHYL ESTERS 1 G PO CAPS
1.0000 g | ORAL_CAPSULE | Freq: Two times a day (BID) | ORAL | Status: DC
  Administered 2021-11-03 – 2021-11-06 (×7): 1 g via ORAL
  Filled 2021-11-03 (×7): qty 1

## 2021-11-03 MED ORDER — GLUCOSAMINE CHONDR 1500 COMPLX PO CAPS
1.0000 | ORAL_CAPSULE | Freq: Every day | ORAL | Status: DC
Start: 2021-11-03 — End: 2021-11-03

## 2021-11-03 MED ORDER — ACETAMINOPHEN 650 MG RE SUPP: 650.0000 mg | Freq: Four times a day (QID) | RECTAL | Status: DC | PRN

## 2021-11-03 MED ORDER — SODIUM CHLORIDE 0.9 % IV BOLUS
1000.0000 mL | Freq: Once | INTRAVENOUS | Status: AC
  Administered 2021-11-03: 1000 mL via INTRAVENOUS

## 2021-11-03 MED ORDER — ENSURE ENLIVE PO LIQD
237.0000 mL | Freq: Two times a day (BID) | ORAL | Status: DC
  Administered 2021-11-03 – 2021-11-06 (×4): 237 mL via ORAL

## 2021-11-03 MED ORDER — SODIUM CHLORIDE 0.9 % IV SOLN: Freq: Once | INTRAVENOUS | Status: AC

## 2021-11-03 MED ORDER — HYDROCHLOROTHIAZIDE 12.5 MG PO TABS
12.5000 mg | ORAL_TABLET | Freq: Every morning | ORAL | Status: DC
  Filled 2021-11-03: qty 1

## 2021-11-03 MED ORDER — ALLOPURINOL 100 MG PO TABS
100.0000 mg | ORAL_TABLET | Freq: Every day | ORAL | Status: DC
  Administered 2021-11-03 – 2021-11-06 (×4): 100 mg via ORAL
  Filled 2021-11-03 (×4): qty 1

## 2021-11-03 MED ORDER — IOHEXOL 300 MG/ML  SOLN
100.0000 mL | Freq: Once | INTRAMUSCULAR | Status: AC | PRN
  Administered 2021-11-03: 100 mL via INTRAVENOUS

## 2021-11-03 MED ORDER — ADULT MULTIVITAMIN W/MINERALS CH
1.0000 | ORAL_TABLET | Freq: Every day | ORAL | Status: DC
Start: 2021-11-03 — End: 2021-11-06
  Administered 2021-11-03 – 2021-11-06 (×4): 1 via ORAL
  Filled 2021-11-03 (×4): qty 1

## 2021-11-03 MED ORDER — MULTIVITAMIN ADULTS 50+ PO TABS
1.0000 | ORAL_TABLET | Freq: Every day | ORAL | Status: DC
Start: 2021-11-03 — End: 2021-11-03

## 2021-11-03 MED ORDER — UPADACITINIB ER 15 MG PO TB24: 15.0000 mg | ORAL_TABLET | Freq: Every day | ORAL | Status: DC

## 2021-11-03 MED ORDER — ONDANSETRON HCL 4 MG PO TABS: 4.0000 mg | ORAL_TABLET | Freq: Four times a day (QID) | ORAL | Status: DC | PRN

## 2021-11-03 MED ORDER — PIPERACILLIN-TAZOBACTAM 3.375 G IVPB 30 MIN
3.3750 g | Freq: Once | INTRAVENOUS | Status: AC
  Administered 2021-11-03: 3.375 g via INTRAVENOUS
  Filled 2021-11-03: qty 50

## 2021-11-03 NOTE — ED Notes (Signed)
Phone Handoff Report given to rec RN at [facility] 4th floor ?

## 2021-11-03 NOTE — Progress Notes (Signed)
End of shift ? ?Pt arrived this morning with abd pain and temp of 101.3 ? ?Abd ultrasound negative, will have mesenteric ultrasound performed 11/04/21.  Pt will be npo at midnight.   ? ?Do not anticipate d/c tomorrow ?

## 2021-11-03 NOTE — H&P (Addendum)
History and Physical    Patient: Mario Fisher KGM:[id number] DOB: 1934-05-01 DOA: 11/02/2021 DOS: the patient was seen and examined on 11/03/2021 PCP: Kristen Loader, FNP  Patient coming from: Home  Chief Complaint:  Chief Complaint  Patient presents with   Chest Pain   Chills    HPI: Mario Fisher is a 88 y.o. male with medical history significant of a fib, SSS s/p PPM, HTN, HLD, ankylosing spondylosis, RA, CKD3a. Presenting with fevers and chills. He reports that he's having intermittent fevers and chills over the last couple of months. These episodes will be accompanied by nausea. He treats himself with APAP and the episodes seem to stop for a while. He reports also during this time he will frequently have LLQ abdominal pain after eating. It doesn't happen every time he eat, but it does happened enough to be noticeable. His pain is crampy and usually resolves w/o medication use. He made an appointment with his PCP to discuss these symptoms, but yesterday he had another more intense episode, so he decided to come to the ED for assistance. During this episode, he had more chills and stomach/chest pain that was in his epigastric region. He denies any other aggravating or alleviating factors.   Review of Systems: As mentioned in the history of present illness. All other systems reviewed and are negative. Past Medical History:  Diagnosis Date   Arthritis    "back" (04/08/2017)   Atrial fibrillation (HCC)    B12 deficiency anemia    BPH (benign prostatic hypertrophy)    Chronic anticoagulation    on Pradaxa   Chronic lower back pain    Glaucoma, both eyes    Glucose intolerance (impaired glucose tolerance)    History of blood transfusion    "I'm thinking they gave me blood when I had the gallbladder OR" (04/08/2017)   History of gout    History of hiatal hernia    Hyperlipidemia    Hypertension    OSA on CPAP    Presence of permanent cardiac pacemaker    Rheumatoid arthritis ([place])     Stroke [facility])    remote CVA noted on MRI; "silent stroke; didn't know when it happened" (04/08/2017)   Past Surgical History:  Procedure Laterality Date   CARDIOVERSION  04/08/2017   Procedure: Cardioversion;  Surgeon: Thompson Grayer, MD;  Location: [facility] CV LAB;  Service: Cardiovascular;;   CARDIOVERSION N/A 07/27/2018   Procedure: CARDIOVERSION;  Surgeon: Lelon Perla, MD;  Location: [facility] ENDOSCOPY;  Service: Cardiovascular;  Laterality: N/A;   CATARACT EXTRACTION W/ INTRAOCULAR LENS  IMPLANT, BILATERAL Bilateral    CHOLECYSTECTOMY OPEN     PACEMAKER IMPLANT N/A 04/08/2017   Medtronic Azure XT dual-chamber MRI conditional pacemaker for symptomatic bradycardia by Dr Rayann Heman   US ECHOCARDIOGRAPHY  02/25/2005   ef 55-60%   Social History:  reports that he quit smoking about 53 years ago. His smoking use included cigars. He has never used smokeless tobacco. He reports that he does not drink alcohol and does not use drugs.  Allergies  Allergen Reactions   Statins Other (See Comments)    Joint pain   Cholesterol Rash    Family History  Problem Relation Age of Onset   Arrhythmia Mother    Heart attack Father        x2   Hypertension Father    Stroke Father    Heart disease Sister    Hypertension Brother     Prior to Admission  medications   Medication Sig Start Date End Date Taking? Authorizing Provider  acetaminophen (TYLENOL) 500 MG tablet Take 650 mg by mouth every 6 (six) hours as needed for moderate pain or headache. PRN   Yes [provider]  cyanocobalamin (,VITAMIN B-12,) 1000 MCG/ML injection Inject 1,000 mcg into the muscle every 30 (thirty) days.   Yes [provider]  dorzolamide (TRUSOPT) 2 % ophthalmic solution Place 1 drop into both eyes 2 (two) times daily. 05/11/17  Yes [provider]  Glucosamine-Chondroit-Vit C-Mn (GLUCOSAMINE CHONDR 1500 COMPLX PO) Take 1 tablet by mouth daily.   Yes [provider]  Multiple  Vitamins-Minerals (MULTIVITAMIN ADULTS 50+ PO) Take 1 tablet by mouth daily.   Yes [provider]  Omega-3 Fatty Acids (FISH OIL) 1200 MG CAPS Take 1,200-2,400 mg by mouth See admin instructions. Take 2400 mg by mouth in the morning and take 1200 mg by mouth at bedtime   Yes [provider]  Rivaroxaban (XARELTO) 15 MG TABS tablet Take 1 tablet (15 mg total) by mouth daily with supper. 08/28/21  Yes Nahser, Wonda Cheng, MD  tamsulosin (FLOMAX) 0.4 MG CAPS capsule Take 0.4 mg by mouth daily.   Yes [provider]  Upadacitinib ER (RINVOQ) 15 MG TB24 Take 15 mg by mouth daily.   Yes [provider]  valsartan-hydrochlorothiazide (DIOVAN-HCT) 80-12.5 MG tablet Take 1 tablet by mouth daily. 03/06/21  Yes [provider]  etanercept (ENBREL SURECLICK) 50 MG/ML injection Inject 50 mg into the skin once a week. 01/16/21   [provider]  fluorouracil (EFUDEX) 5 % cream Apply topically daily.    [provider]  Propylene Glycol 0.6 % SOLN Place 1 drop into both eyes 2 (two) times daily.    [provider]    Physical Exam: Vitals:   11/03/21 0981 11/03/21 0843 11/03/21 0851 11/03/21 0853  BP: 110/67 118/69 118/69   Pulse: 60 60 (!) 59   Resp: 20  14   Temp: 98.8 F (37.1 C)  98.7 F (37.1 C)   TempSrc: Oral  Oral   SpO2: 99% 100% 100%   Weight:    74.3 kg  Height:       General: 88 y.o. male resting in bed in NAD Eyes: PERRL, normal sclera ENMT: Nares patent w/o discharge, orophaynx clear, dentition normal, ears w/o discharge/lesions/ulcers Neck: Supple, trachea midline Cardiovascular: RRR, +S1, S2, no m/g/r, equal pulses throughout Respiratory: CTABL, no w/r/r, normal WOB GI: BS+, NDNT, no masses noted, no organomegaly noted MSK: No e/c/c Neuro: A&O x 3, no focal deficits Psyc: Appropriate interaction and affect, calm/cooperative  Data Reviewed:  Na+  131 CO2  21 Glucose  141 Scr  1.30 Alk phos  303 AST  416 ALT   337 Tbili  3.8 Trp 27 -> 28 WBC 13.8 Hgb 11.6  CT ab/pelvis w/ contrast: 1. Status post cholecystectomy, with fluid and inflammatory stranding seen along the inferior aspect of the gallbladder fossa and adjacent portion of the right lobe of the liver, as described above. This is likely secondary to proximal duodenitis without evidence of perforation. 2. Marked severity atherosclerotic changes along the origin and proximal portion of the superior mesenteric artery with subsequent hemodynamically significant stenosis. Further evaluation with conventional angiography is recommended. 3. Sigmoid diverticulosis. 4. Moderate to marked severity prostatomegaly. Correlation with PSA values is recommended. 5. Mild posterior left basilar and anteromedial right lower lobe atelectasis and/or early infiltrate. 6. Marked severity dextroscoliosis of the mid to lower  lumbar spine with marked severity multilevel degenerative changes.   Assessment and Plan: No notes have been filed under this hospital service. Service: Hospitalist Duodenitis SIRS     - admit to inpt, tele     - continue abx as rocephin and flagyl     - fluids, anti-emetics, pain control  Atherosclerosis of mesenteric arteries     - as noted on CT ab/pelvis     - angiography recommended; he just had contrast this AM and he is a CKD3a patient     - sidebarred VVS (Dr. Virl Cagey); rec'd treating duodenitis, checking mesenteric duplex and outpt follow up; appreciate his assistance  Elevated LFTs     - check hep panel, RUQ [place]     - s/p chole     - check fractionated bili  Hyponatremia     - mild, fluids  A fib on anticoagulation     - continue home regimen  BPH     - continue home regimen  Rheumatoid arthritis Ankylosing spondylitis     - continue home regimen  Chest pain     - troponins are flat 27 -> 28; no chest at this time     - EKG: a fib w/ normal rate and no st elevations     - monitor  CKD3a     - at baseline, watch  nephrotoxins  Normocytic anemia     - no evidence of bleed, follow  HTN     - pressures were soft ON, hold BP med today     - reassess in AM to resume home regimen   Advance Care Planning:   Code Status: DNI  Consults: Sidebarred VVS (Dr. Virl Cagey)  Family Communication: Attempted call to spouse at number listed in chart. Received VM only.  Severity of Illness: The appropriate patient status for this patient is INPATIENT. Inpatient status is judged to be reasonable and necessary in order to provide the required intensity of service to ensure the patient's safety. The patient's presenting symptoms, physical exam findings, and initial radiographic and laboratory data in the context of their chronic comorbidities is felt to place them at high risk for further clinical deterioration. Furthermore, it is not anticipated that the patient will be medically stable for discharge from the hospital within 2 midnights of admission.   * I certify that at the point of admission it is my clinical judgment that the patient will require inpatient hospital care spanning beyond 2 midnights from the point of admission due to high intensity of service, high risk for further deterioration and high frequency of surveillance required.*  Author: Jonnie Finner, DO 11/03/2021 9:09 AM  For on call review [email or web address].

## 2021-11-03 NOTE — Plan of Care (Signed)

## 2021-11-03 NOTE — ED Provider Notes (Signed)
[facility] EMERGENCY DEPARTMENT Provider Note   CSN: [id number] Arrival date & time: 11/02/21  2304     History  Chief Complaint  Patient presents with   Chest Pain   Chills    Mario Fisher is a 88 y.o. male.  Patient presents with chief complaint of intermittent fevers and chills.  It has been going on for about 2 months.  Wife has kept a detailed records, stating that he seems to have fever/chills every 11 days or so.  Today is his 11th day from his prior fevers and chills and appears recurrent again.  Otherwise no reports of cough no reports of vomiting or diarrhea no reports of abdominal pain or runny nose or sore throat.  Patient also complains of some chest tightness today.  Family otherwise denies any recent travel no recent antibiotic use.      Home Medications Prior to Admission medications   Medication Sig Start Date End Date Taking? Authorizing Provider  acetaminophen (TYLENOL) 500 MG tablet Take 650 mg by mouth every 6 (six) hours as needed for moderate pain or headache. PRN    [provider]  cyanocobalamin (,VITAMIN B-12,) 1000 MCG/ML injection Inject 1,000 mcg into the muscle every 30 (thirty) days.    [provider]  dorzolamide (TRUSOPT) 2 % ophthalmic solution Place 1 drop into both eyes 2 (two) times daily. 05/11/17   [provider]  etanercept (ENBREL SURECLICK) 50 MG/ML injection Inject 50 mg into the skin once a week. 01/16/21   [provider]  fluorouracil (EFUDEX) 5 % cream Apply topically daily.    [provider]  Glucosamine-Chondroit-Vit C-Mn (GLUCOSAMINE CHONDR 1500 COMPLX PO) Take 1 tablet by mouth daily.    [provider]  Multiple Vitamins-Minerals (MULTIVITAMIN ADULTS 50+ PO) Take 1 tablet by mouth daily.    [provider]  Omega-3 Fatty Acids (FISH OIL) 1200 MG CAPS Take 1,200-2,400 mg by mouth See admin instructions. Take 2400 mg by mouth in the morning and take 1200 mg  by mouth at bedtime    [provider]  Propylene Glycol 0.6 % SOLN Place 1 drop into both eyes 2 (two) times daily.    [provider]  Rivaroxaban (XARELTO) 15 MG TABS tablet Take 1 tablet (15 mg total) by mouth daily with supper. 08/28/21   Nahser, Wonda Cheng, MD  tamsulosin (FLOMAX) 0.4 MG CAPS capsule Take 0.4 mg by mouth daily.    [provider]  valsartan-hydrochlorothiazide (DIOVAN-HCT) 80-12.5 MG tablet Take 1 tablet by mouth daily. 03/06/21   [provider]      Allergies    Statins and Cholesterol    Review of Systems   Review of Systems  Constitutional:  Positive for fever.  HENT:  Negative for ear pain and sore throat.   Eyes:  Negative for pain.  Respiratory:  Negative for cough.   Cardiovascular:  Positive for chest pain.  Gastrointestinal:  Negative for abdominal pain.  Genitourinary:  Negative for flank pain.  Musculoskeletal:  Negative for back pain.  Skin:  Negative for color change and rash.  Neurological:  Negative for syncope.  All other systems reviewed and are negative.  Physical Exam Updated Vital Signs BP (!) 105/59    Pulse 72    Temp 100.2 F (37.9 C) (Oral)    Resp (!) 21    Ht 5\' 6"  (1.676 m)    Wt 73 kg    SpO2 97%    BMI  25.98 kg/m  Physical Exam Constitutional:      Appearance: He is well-developed.  HENT:     Head: Normocephalic.     Nose: Nose normal.  Eyes:     Extraocular Movements: Extraocular movements intact.  Cardiovascular:     Rate and Rhythm: Normal rate.  Pulmonary:     Effort: Pulmonary effort is normal.  Abdominal:     Tenderness: There is no abdominal tenderness. There is no guarding or rebound.  Skin:    Coloration: Skin is not jaundiced.  Neurological:     Mental Status: He is alert. Mental status is at baseline.    ED Results / Procedures / Treatments   Labs (all labs ordered are listed, but only abnormal results are displayed) Labs Reviewed  CBC WITH DIFFERENTIAL/PLATELET -  Abnormal; Notable for the following components:      Result Value   WBC 13.8 (*)    RBC 3.61 (*)    Hemoglobin 11.6 (*)    HCT 34.1 (*)    Neutro Abs 12.4 (*)    Lymphs Abs 0.4 (*)    Abs Immature Granulocytes 0.14 (*)    All other components within normal limits  COMPREHENSIVE METABOLIC PANEL - Abnormal; Notable for the following components:   Sodium 131 (*)    CO2 21 (*)    Glucose, Bld 141 (*)    Creatinine, Ser 1.30 (*)    AST 416 (*)    ALT 337 (*)    Alkaline Phosphatase 303 (*)    Total Bilirubin 3.8 (*)    GFR, Estimated 54 (*)    All other components within normal limits  URINALYSIS, ROUTINE W REFLEX MICROSCOPIC - Abnormal; Notable for the following components:   Color, Urine AMBER (*)    Bilirubin Urine MODERATE (*)    Protein, ur 30 (*)    All other components within normal limits  URINALYSIS, MICROSCOPIC (REFLEX) - Abnormal; Notable for the following components:   Bacteria, UA RARE (*)    Non Squamous Epithelial PRESENT (*)    All other components within normal limits  TROPONIN I (HIGH SENSITIVITY) - Abnormal; Notable for the following components:   Troponin I (High Sensitivity) 27 (*)    All other components within normal limits  TROPONIN I (HIGH SENSITIVITY) - Abnormal; Notable for the following components:   Troponin I (High Sensitivity) 28 (*)    All other components within normal limits  RESP PANEL BY RT-PCR (FLU A&B, COVID) ARPGX2  CULTURE, BLOOD (ROUTINE X 2)  CULTURE, BLOOD (ROUTINE X 2)  URINE CULTURE  LACTIC ACID, PLASMA    EKG EKG Interpretation  Date/Time:  Saturday November 02 2021 23:13:10 EST Ventricular Rate:  84 PR Interval:    QRS Duration: 89 QT Interval:  351 QTC Calculation: 415 R Axis:   -46 Text Interpretation: Atrial fibrillation Inferior infarct, old Anterior infarct, old Confirmed by Thamas Jaegers ([id number]) on 11/02/2021 11:15:39 PM  Radiology CT Abdomen Pelvis W Contrast  Result Date: 11/03/2021 CLINICAL DATA:  Chest pain and  chills with back pain, low-grade fever and vomiting. EXAM: CT ABDOMEN AND PELVIS WITH CONTRAST TECHNIQUE: Multidetector CT imaging of the abdomen and pelvis was performed using the standard protocol following bolus administration of intravenous contrast. RADIATION DOSE REDUCTION: This exam was performed according to the departmental dose-optimization program which includes automated exposure control, adjustment of the mA and/or kV according to patient size and/or use of iterative reconstruction technique. CONTRAST:  14mL OMNIPAQUE IOHEXOL 300 MG/ML  SOLN  COMPARISON:  None. FINDINGS: Lower chest: Mild areas of atelectasis and/or early infiltrate are seen within the posterior aspect of the left lung base and anteromedial aspect of the right lower lobe. Hepatobiliary: No focal liver abnormality is seen. Status post cholecystectomy, with a mild amount of fluid and inflammatory stranding seen along the inferior aspect of the gallbladder fossa. This is seen in between the medial aspect of the right lobe of the liver and first part of the proximal duodenum (axial CT images 26 through 29, CT series 2). Mild central intrahepatic biliary dilatation is noted. Pancreas: Unremarkable. No pancreatic ductal dilatation or surrounding inflammatory changes. Spleen: Normal in size without focal abnormality. Adrenals/Urinary Tract: Adrenal glands are unremarkable. Kidneys are normal in size, without renal calculi or hydronephrosis. A 7 mm cystic appearing focus is seen within the lower pole of the right kidney. The urinary bladder is partially contracted. Mild diffuse urinary bladder wall thickening is seen. Stomach/Bowel: Stomach is within normal limits. Appendix appears normal. No evidence of bowel wall thickening, distention, or inflammatory changes. Noninflamed diverticula are seen throughout the sigmoid colon Vascular/Lymphatic: Aortic atherosclerosis. Marked severity atherosclerotic changes are seen along the origin and proximal  portion of the superior mesenteric artery (approximately 15 mm in length) with subsequent hemodynamically significant stenosis (best seen on coronal reformatted images 46 through 51, CT series 5/sagittal reformatted image 75, CT series 6). 18 mm x 10 mm x 25 mm and 25 mm x 10 mm x 16 mm central superior mesenteric lymph nodes are seen (axial CT images 21 through 25, CT series 2). Reproductive: There is moderate to marked severity prostate gland enlargement. Mass effect is seen on the adjacent portion of the urinary bladder. Other: No abdominal wall hernia or abnormality. No abdominopelvic ascites. Musculoskeletal: There is marked severity dextroscoliosis of the mid to lower lumbar spine with marked severity multilevel degenerative changes. IMPRESSION: 1. Status post cholecystectomy, with fluid and inflammatory stranding seen along the inferior aspect of the gallbladder fossa and adjacent portion of the right lobe of the liver, as described above. This is likely secondary to proximal duodenitis without evidence of perforation. 2. Marked severity atherosclerotic changes along the origin and proximal portion of the superior mesenteric artery with subsequent hemodynamically significant stenosis. Further evaluation with conventional angiography is recommended. 3. Sigmoid diverticulosis. 4. Moderate to marked severity prostatomegaly. Correlation with PSA values is recommended. 5. Mild posterior left basilar and anteromedial right lower lobe atelectasis and/or early infiltrate. 6. Marked severity dextroscoliosis of the mid to lower lumbar spine with marked severity multilevel degenerative changes. Aortic Atherosclerosis (ICD10-I70.0). Electronically Signed   By: Virgina Norfolk M.D.   On: 11/03/2021 01:44   DG Chest Port 1 View  Result Date: 11/02/2021 CLINICAL DATA:  Fever. EXAM: PORTABLE CHEST 1 VIEW COMPARISON:  Chest CT 09/03/2017 FINDINGS: Left-sided pacemaker in place. Mild cardiomegaly. Normal mediastinal  contours. No confluent consolidation or evidence of pneumonia. No pulmonary edema no pleural fluid or pneumothorax. IMPRESSION: 1. Mild cardiomegaly. No evidence of pneumonia or explanation for fever. 2. Left-sided pacemaker in place. Electronically Signed   By: Keith Rake M.D.   On: 11/02/2021 23:43    Procedures Procedures    Medications Ordered in ED Medications  acetaminophen (TYLENOL) tablet 650 mg (650 mg Oral Given 11/02/21 2344)  iohexol (OMNIPAQUE) 300 MG/ML solution 100 mL (100 mLs Intravenous Contrast Given 11/03/21 0044)  sodium chloride 0.9 % bolus 1,000 mL (1,000 mLs Intravenous New Bag/Given 11/03/21 0159)    ED Course/ Medical Decision Making/  A&P                           Medical Decision Making Amount and/or Complexity of Data Reviewed Independent Historian: spouse External Data Reviewed: notes.    Details: Patient following up with cardiologist in February. Labs: ordered.    Details: White count elevated at 13.8, lactic acid normal.  Chemistry shows persistent renal insufficiency, as well as Elevated liver enzymes, T. bili 3.8.Marland Kitchen Radiology: ordered.    Details: The abdomen pelvis pursued, concerning for signs of duodenitis and concern for abdominal arterial stenosis, recommending CT angio. Discussion of management or test interpretation with external provider(s): Hospitalist  Risk OTC drugs. Prescription drug management. Decision regarding hospitalization. Risk Details: Decision not to pursue CT angio at this time.  Patient has no abdominal pain, normal lactic acid.  Blood pressure did drop to the low 90s at 1 point but appears normalized still.  Held off on antibiotics at this time as the etiology of his intermittent fevers is unclear.  Blood cultures are sent.  Patient admitted to the hospitalist team.           Final Clinical Impression(s) / ED Diagnoses Final diagnoses:  Febrile illness  Transaminitis    Rx / DC Orders ED Discharge Orders      None         Luna Fuse, MD 11/03/21 [phone]

## 2021-11-03 NOTE — Progress Notes (Signed)
PHARMACIST - PHYSICIAN ORDER COMMUNICATION ? ?CONCERNING: P&T Medication Policy on Herbal Medications ? ?DESCRIPTION:  This patient?s order for:  glucosamine chondroidtin  has been noted. ? ?This product(s) is classified as an ?herbal? or natural product. ?Due to a lack of definitive safety studies or FDA approval, nonstandard manufacturing practices, plus the potential risk of unknown drug-drug interactions while on inpatient medications, the Pharmacy and Therapeutics Committee does not permit the use of ?herbal? or natural products of this type within [facility]. ?  ?ACTION TAKEN: ?The pharmacy department is unable to verify this order at this time and your patient has been informed of this safety policy. ?Please reevaluate patient?s clinical condition at discharge and address if the herbal or natural product(s) should be resumed at that time. ? ?Peggyann Juba, PharmD, BCPS ?Pharmacy: [id number] ?11/03/2021 9:53 AM ? ?

## 2021-11-03 NOTE — ED Notes (Signed)
Phone Handoff Report provided to [facility] Transport Team 

## 2021-11-04 ENCOUNTER — Inpatient Hospital Stay (HOSPITAL_COMMUNITY): Payer: Medicare Other

## 2021-11-04 DIAGNOSIS — N1831 Chronic kidney disease, stage 3a: Secondary | ICD-10-CM

## 2021-11-04 DIAGNOSIS — R079 Chest pain, unspecified: Secondary | ICD-10-CM | POA: Diagnosis not present

## 2021-11-04 DIAGNOSIS — E871 Hypo-osmolality and hyponatremia: Secondary | ICD-10-CM

## 2021-11-04 DIAGNOSIS — R7989 Other specified abnormal findings of blood chemistry: Secondary | ICD-10-CM | POA: Diagnosis not present

## 2021-11-04 DIAGNOSIS — K2980 Duodenitis without bleeding: Principal | ICD-10-CM

## 2021-11-04 DIAGNOSIS — R7881 Bacteremia: Secondary | ICD-10-CM | POA: Diagnosis not present

## 2021-11-04 DIAGNOSIS — D649 Anemia, unspecified: Secondary | ICD-10-CM

## 2021-11-04 DIAGNOSIS — B9620 Unspecified Escherichia coli [E. coli] as the cause of diseases classified elsewhere: Secondary | ICD-10-CM

## 2021-11-04 DIAGNOSIS — I480 Paroxysmal atrial fibrillation: Secondary | ICD-10-CM

## 2021-11-04 DIAGNOSIS — R143 Flatulence: Secondary | ICD-10-CM

## 2021-11-04 LAB — COMPREHENSIVE METABOLIC PANEL
ALT: 206 U/L — ABNORMAL HIGH (ref 0–44)
AST: 134 U/L — ABNORMAL HIGH (ref 15–41)
Albumin: 3.3 g/dL — ABNORMAL LOW (ref 3.5–5.0)
Alkaline Phosphatase: 233 U/L — ABNORMAL HIGH (ref 38–126)
Anion gap: 8 (ref 5–15)
BUN: 24 mg/dL — ABNORMAL HIGH (ref 8–23)
CO2: 22 mmol/L (ref 22–32)
Calcium: 9 mg/dL (ref 8.9–10.3)
Chloride: 104 mmol/L (ref 98–111)
Creatinine, Ser: 1.25 mg/dL — ABNORMAL HIGH (ref 0.61–1.24)
GFR, Estimated: 56 mL/min — ABNORMAL LOW (ref >60)
Glucose, Bld: 86 mg/dL (ref 70–99)
Potassium: 3.9 mmol/L (ref 3.5–5.1)
Sodium: 134 mmol/L — ABNORMAL LOW (ref 135–145)
Total Bilirubin: 5.6 mg/dL — ABNORMAL HIGH (ref 0.3–1.2)
Total Protein: 6.2 g/dL — ABNORMAL LOW (ref 6.5–8.1)

## 2021-11-04 LAB — BLOOD CULTURE ID PANEL (REFLEXED) - BCID2

## 2021-11-04 LAB — CBC
HCT: 29.8 % — ABNORMAL LOW (ref 39.0–52.0)
Hemoglobin: 9.9 g/dL — ABNORMAL LOW (ref 13.0–17.0)
MCH: 31.6 pg (ref 26.0–34.0)
MCHC: 33.2 g/dL (ref 30.0–36.0)
MCV: 95.2 fL (ref 80.0–100.0)
Platelets: 143 10*3/uL — ABNORMAL LOW (ref 150–400)
RBC: 3.13 MIL/uL — ABNORMAL LOW (ref 4.22–5.81)
RDW: 15.7 % — ABNORMAL HIGH (ref 11.5–15.5)
WBC: 9.2 10*3/uL (ref 4.0–10.5)
nRBC: 0 % (ref 0.0–0.2)

## 2021-11-04 LAB — ECHOCARDIOGRAM COMPLETE BUBBLE STUDY
AR max vel: 1.3 cm2
AV Area VTI: 1.28 cm2
AV Area mean vel: 1.1 cm2
AV Mean grad: 6 mmHg
AV Peak grad: 8.1 mmHg
Ao pk vel: 1.42 m/s
Area-P 1/2: 3.27 cm2
MV M vel: 4.31 m/s
MV Peak grad: 74.3 mmHg
S' Lateral: 2.7 cm
Single Plane A4C EF: 52.2 %

## 2021-11-04 LAB — URINE CULTURE: Culture: NO GROWTH

## 2021-11-04 LAB — IRON AND TIBC
Iron: 84 ug/dL (ref 45–182)
Saturation Ratios: 30 % (ref 17.9–39.5)
TIBC: 280 ug/dL (ref 250–450)
UIBC: 196 ug/dL

## 2021-11-04 LAB — FERRITIN: Ferritin: 207 ng/mL (ref 24–336)

## 2021-11-04 MED ORDER — PANTOPRAZOLE SODIUM 40 MG PO TBEC
40.0000 mg | DELAYED_RELEASE_TABLET | Freq: Every day | ORAL | Status: DC
Start: 2021-11-05 — End: 2021-11-06
  Administered 2021-11-05 – 2021-11-06 (×2): 40 mg via ORAL
  Filled 2021-11-04 (×2): qty 1

## 2021-11-04 MED ORDER — PANTOPRAZOLE SODIUM 40 MG IV SOLR
40.0000 mg | INTRAVENOUS | Status: DC
  Administered 2021-11-04: 40 mg via INTRAVENOUS
  Filled 2021-11-04: qty 10

## 2021-11-04 NOTE — Consult Note (Signed)
?   ? ? ? ? ?[facility]   ? ?Date of Admission:  11/02/2021   Total days of inpatient antibiotics 1 ? ?      ?Reason for Consult: Ecoli bacteremia    ?Principal Problem: ?  SIRS (systemic inflammatory response syndrome) (HCC) ?Active Problems: ?  HTN (hypertension) ?  Duodenitis ?  Elevated LFTs ?  Chest pain ?  Rheumatoid arthritis (HCC) ?  Ankylosing spondylitis (HCC) ?  Stage 3a chronic kidney disease (CKD) (HCC) ?  Atherosclerosis ?  BPH (benign prostatic hyperplasia) ?  Normocytic anemia ?  E coli bacteremia ?  Paroxysmal atrial fibrillation (HCC) ?  Hyponatremia ? ? ?Assessment: ?36 YM admitted for duodenitis found to have  Ecoli bacteremia. ? ?#Ecoli bacteremia 2/2 likely GI etiology ?-CT showed  stranding in the inferior aspect of gallbladder fossa and adjacent to right lobe of liver likely 2/2 duodenitis. US showed no intrahepatic and extrahepatic biliary ductal dilation.  ?-GI consulted and plan to obtain MRCP with LFTs worsen. Work up for elevated LFTs pending.  ?-Acute hepatic panel negative(HBsAg, HCV ab, HAIgM, Hbcore Ab) ?-LFTs trending down, Tbili elevated 5.6 today(from 3.8) ?-Pt is jaundiced. Reports midabdominal 30 min following meals since January 27th, which is consistent with duodenal ulcer. Secondly, I think MRCP would be helpful given tbili is elevated and pt is jaundiced, to rule out biliary source(SP cholecystectomy). He does have a PPM(2018) without issues, this is unlikely the source. ?Recommendations:  ?-Continue ceftriaxone and metronidazole ?-Follow blood Cx, urine Cx ?-TTE ?-Would recommend MRCP given jaundice//scleral icterus and elevated t-billi ?-Consider EGD ? ?Microbiology:   ?Antibiotics: ?Ceftriaxone and metronidazole 3/5-p ?Pip-tazo 3/6 ? ?Cultures: ?Blood ?3/5 1/2 Ecoli ?Urine ?3/5 pending ? ? ?HPI: Mario Fisher is a 88 y.o. male with Afib on Xarelto, SSS SP PPM, HTN, HLD,  ankylosing spondylosis, RA on rinvoq admitted for duodenitis. Presented  with LLQ pain after eating. ?On arrival to the ED, pt had temp of 101.3, wbc 13.8k, 416/337/303 ASP/ALT/ALP tbili 3.8. CT showed  stranding in the inferior aspect of gallbladder fossa and adjacent to right lobe of liver likely 2/2 duodenitis. US showed no intrahepatic and extrahepatic biliary ductal dilation. Gi consulted and , obtain MRCP if LFTs worsen. ?He reports since January he has had pain after eating. Intermittent chills and low grade fevers with nausea.  ? ?Review of Systems: ?Review of Systems  ?All other systems reviewed and are negative. ? ?Past Medical History:  ?Diagnosis Date  ? Arthritis   ? "back" (04/08/2017)  ? Atrial fibrillation (HCC)   ? B12 deficiency anemia   ? BPH (benign prostatic hypertrophy)   ? Chronic anticoagulation   ? on Pradaxa, now on xarelto not pradaxa  ? Chronic lower back pain   ? Glaucoma, both eyes   ? Glucose intolerance (impaired glucose tolerance)   ? History of blood transfusion   ? "I'm thinking they gave me blood when I had the gallbladder OR" (04/08/2017)  ? History of gout   ? History of hiatal hernia   ? Hyperlipidemia   ? Hypertension   ? OSA on CPAP   ? Presence of permanent cardiac pacemaker   ? Rheumatoid arthritis (HCC)   ? Stroke [facility])   ? remote CVA noted on MRI; "silent stroke; didn't know when it happened" (04/08/2017)  ? ? ?Social History  ? ?Tobacco Use  ? Smoking status: Former  ?  Types: Cigars  ?  Quit date: 09/01/1968  ?  Years since quitting: 53.2  ? Smokeless tobacco: Never  ?Vaping Use  ? Vaping Use: Never used  ?Substance Use Topics  ? Alcohol use: No  ? Drug use: No  ? ? ?Family History  ?Problem Relation Age of Onset  ? Arrhythmia Mother   ? Heart attack Father   ?     x2  ? Hypertension Father   ? Stroke Father   ? Heart disease Sister   ? Hypertension Brother   ? ?Scheduled Meds: ? allopurinol  100 mg Oral Daily  ? dorzolamide  1 drop Both Eyes BID  ? feeding supplement  237 mL Oral BID BM  ? hydrochlorothiazide  12.5 mg Oral q morning  ? irbesartan   75 mg Oral Daily  ? multivitamin with minerals  1 tablet Oral Daily  ? omega-3 acid ethyl esters  1 g Oral BID  ? [START ON 11/05/2021] pantoprazole  40 mg Oral Daily  ? polyvinyl alcohol  1 drop Both Eyes BID  ? Rivaroxaban  15 mg Oral Q supper  ? tamsulosin  0.4 mg Oral Daily  ? ?Continuous Infusions: ? cefTRIAXone (ROCEPHIN)  IV 2 g (11/03/21 1601)  ? metronidazole 500 mg (11/04/21 0129)  ? ?PRN Meds:.acetaminophen **OR** acetaminophen, HYDROcodone-acetaminophen, ondansetron **OR** ondansetron (ZOFRAN) IV ?Allergies  ?Allergen Reactions  ? Statins Other (See Comments)  ?  Joint pain  ? Cholesterol Rash  ? ? ?OBJECTIVE: ?Blood pressure (!) 169/90, pulse 60, temperature 97.8 ?F (36.6 ?C), temperature source Oral, resp. rate 16, height 5\' 6"  (1.676 m), weight 74.3 kg, SpO2 100 %. ? ?Physical Exam ?Constitutional:   ?   General: He is not in acute distress. ?   Appearance: He is normal weight. He is not toxic-appearing.  ?HENT:  ?   Head: Normocephalic and atraumatic.  ?   Right Ear: External ear normal.  ?   Left Ear: External ear normal.  ?   Nose: No congestion or rhinorrhea.  ?   Mouth/Throat:  ?   Mouth: Mucous membranes are moist.  ?   Pharynx: Oropharynx is clear.  ?Eyes:  ?   Extraocular Movements: Extraocular movements intact.  ?   Conjunctiva/sclera: Conjunctivae normal.  ?   Pupils: Pupils are equal, round, and reactive to light.  ?   Comments: Scleral icterus  ?Cardiovascular:  ?   Rate and Rhythm: Normal rate and regular rhythm.  ?   Heart sounds: No murmur heard. ?  No friction rub. No gallop.  ?Pulmonary:  ?   Effort: Pulmonary effort is normal.  ?   Breath sounds: Normal breath sounds.  ?Abdominal:  ?   General: Abdomen is flat. Bowel sounds are normal.  ?   Palpations: Abdomen is soft.  ?Musculoskeletal:     ?   General: No swelling. Normal range of motion.  ?   Cervical back: Normal range of motion and neck supple.  ?Skin: ?   General: Skin is warm and dry.  ?   Comments: jaudiced  ?Neurological:  ?    General: No focal deficit present.  ?   Mental Status: He is oriented to person, place, and time.  ?Psychiatric:     ?   Mood and Affect: Mood normal.  ? ? ?Lab Results ?Lab Results  ?Component Value Date  ? WBC 9.2 11/04/2021  ? HGB 9.9 (L) 11/04/2021  ? HCT 29.8 (L) 11/04/2021  ? MCV 95.2 11/04/2021  ? PLT 143 (L) 11/04/2021  ?  ?Lab Results  ?  Component Value Date  ? CREATININE 1.25 (H) 11/04/2021  ? BUN 24 (H) 11/04/2021  ? NA 134 (L) 11/04/2021  ? K 3.9 11/04/2021  ? CL 104 11/04/2021  ? CO2 22 11/04/2021  ?  ?Lab Results  ?Component Value Date  ? ALT 206 (H) 11/04/2021  ? AST 134 (H) 11/04/2021  ? ALKPHOS 233 (H) 11/04/2021  ? BILITOT 5.6 (H) 11/04/2021  ?  ? ? ? ?Laurice Record, MD ?[facility] ?[facility] ?11/04/2021, 12:45 PM  ?

## 2021-11-04 NOTE — Assessment & Plan Note (Signed)
-   Hemoglobin stable.  Monitor intermittently ?

## 2021-11-04 NOTE — Progress Notes (Signed)
PHARMACY - PHYSICIAN COMMUNICATION ?CRITICAL VALUE ALERT - BLOOD CULTURE IDENTIFICATION (BCID) ? ?Mario Fisher is an 88 y.o. male who presented to [facility] on 11/02/2021 with a chief complaint of low back pain, fever, increase in urinary frequency and vomiting.  ? ?Assessment:  Admit with SIRS.  Abdominal CT + duodenitis.   ?Currently afebrile on antibiotics.   ?BCID + Ecoli, no resistance.  Urine cx pending.  ? ?Name of physician (or Provider) Contacted: X Blount ? ?Current antibiotics: Rocephin + Flagyl ? ?Changes to prescribed antibiotics recommended:  ?Patient is on recommended antibiotics - No changes needed ? ?Results for orders placed or performed during the hospital encounter of 11/02/21  ?Blood Culture ID Panel (Reflexed) (Collected: 11/02/2021 11:42 PM)  ?Result Value Ref Range  ? Enterococcus faecalis NOT DETECTED NOT DETECTED  ? Enterococcus Faecium NOT DETECTED NOT DETECTED  ? Listeria monocytogenes NOT DETECTED NOT DETECTED  ? Staphylococcus species NOT DETECTED NOT DETECTED  ? Staphylococcus aureus (BCID) NOT DETECTED NOT DETECTED  ? Staphylococcus epidermidis NOT DETECTED NOT DETECTED  ? Staphylococcus lugdunensis NOT DETECTED NOT DETECTED  ? Streptococcus species NOT DETECTED NOT DETECTED  ? Streptococcus agalactiae NOT DETECTED NOT DETECTED  ? Streptococcus pneumoniae NOT DETECTED NOT DETECTED  ? Streptococcus pyogenes NOT DETECTED NOT DETECTED  ? A.calcoaceticus-baumannii NOT DETECTED NOT DETECTED  ? Bacteroides fragilis NOT DETECTED NOT DETECTED  ? Enterobacterales DETECTED (A) NOT DETECTED  ? Enterobacter cloacae complex NOT DETECTED NOT DETECTED  ? Escherichia coli DETECTED (A) NOT DETECTED  ? Klebsiella aerogenes NOT DETECTED NOT DETECTED  ? Klebsiella oxytoca NOT DETECTED NOT DETECTED  ? Klebsiella pneumoniae NOT DETECTED NOT DETECTED  ? Proteus species NOT DETECTED NOT DETECTED  ? Salmonella species NOT DETECTED NOT DETECTED  ? Serratia marcescens NOT DETECTED NOT DETECTED  ? Haemophilus  influenzae NOT DETECTED NOT DETECTED  ? Neisseria meningitidis NOT DETECTED NOT DETECTED  ? Pseudomonas aeruginosa NOT DETECTED NOT DETECTED  ? Stenotrophomonas maltophilia NOT DETECTED NOT DETECTED  ? Candida albicans NOT DETECTED NOT DETECTED  ? Candida auris NOT DETECTED NOT DETECTED  ? Candida glabrata NOT DETECTED NOT DETECTED  ? Candida krusei NOT DETECTED NOT DETECTED  ? Candida parapsilosis NOT DETECTED NOT DETECTED  ? Candida tropicalis NOT DETECTED NOT DETECTED  ? Cryptococcus neoformans/gattii NOT DETECTED NOT DETECTED  ? CTX-M ESBL NOT DETECTED NOT DETECTED  ? Carbapenem resistance IMP NOT DETECTED NOT DETECTED  ? Carbapenem resistance KPC NOT DETECTED NOT DETECTED  ? Carbapenem resistance NDM NOT DETECTED NOT DETECTED  ? Carbapenem resist OXA 48 LIKE NOT DETECTED NOT DETECTED  ? Carbapenem resistance VIM NOT DETECTED NOT DETECTED  ? ? ?Junita Push PharmD ?11/04/2021  2:21 AM ? ?

## 2021-11-04 NOTE — Assessment & Plan Note (Signed)
-   Continue Flomax 

## 2021-11-04 NOTE — Progress Notes (Addendum)
Initial Nutrition Assessment ? ?DOCUMENTATION CODES:  ? ?Not applicable ? ?INTERVENTION:  ?- continue Ensure Plus High Protein BID, each supplement provides 350 kcal and 20 grams of protein. ? ? ?NUTRITION DIAGNOSIS:  ? ?Increased nutrient needs related to acute illness as evidenced by estimated needs. ? ?GOAL:  ? ?Patient will meet greater than or equal to 90% of their needs ? ?MONITOR:  ? ?PO intake, Supplement acceptance, Labs, Weight trends ? ?REASON FOR ASSESSMENT:  ? ?Malnutrition Screening Tool ? ?ASSESSMENT:  ? ?88 y.o. male with medical history of Afib, SSS s/p PPM, HTN, HLD, ankylosing spondylosis, RA, and stage 3 CKD. He presented to the ED with on and off fevers and chills for the last couple of months along with abdominal pain after eating. CT abdomen/pelvis showed proximal duodenitis without perforation and severe atherosclerosis in proximal portion of superior mesenteric artery with subsequent hemodynamically significant stenosis. He was admitted with dx of SIRS. ? ?Intakes since admission have been </= 25% at meals. He is ordered Ensure BID and has accepted both bottles. One bottle on bedside table at the time of visit. ? ?Patient laying in bed with no visitors present when RD entered the room but two females arrived shortly before RD left the room. ? ?RD able to talk briefly with RN after leaving the room.  ? ?Patient shares that for many years he has consumed 2 meals/day (a morning and early evening meal). He reports no taste changes PTA although he has found that foods taste different d/t the way they are made and changes in farming practices. No chewing or swallowing difficulties at baseline.  ? ?He confirms that he was experiencing intermittent abdominal pain with PO intakes and overall decrease in volume of food eaten and appetite since November or December. He shares that pain was random and did not seem to be caused by any foods in particular. How long pain would last was also  variable. ? ?Weight yesterday was 164 lb, weight on 06/20/21 was 161 lb, and weight on 12/19/20 was 165 lb. No information documented in the edema section of flow sheet and unable to assess BLE during NFPE today.  ? ?Patient denies any nutrition-related questions, concerns, or needs at this time but is aware that RN can alert RD if these things arise for patient.  ? ? ?Labs reviewed; Na: 134 mmol/l, BUN: 24 mg/dl, creatinine: 1.25 mg/dl, Alk Phos and LFT elevated, GFR: 56 ml/min.  ? ?Medications reviewed; 12.5 mg hydrodiuril/day, 1 tablet multivitamin with minerals/day, 40 mg oral protonix/day, 1 g lovaza BID. ?  ? ?NUTRITION - FOCUSED PHYSICAL EXAM: ? ?Flowsheet Row Most Recent Value  ?Orbital Region No depletion  ?Upper Arm Region Mild depletion  ?Thoracic and Lumbar Region Unable to assess  ?Buccal Region No depletion  ?Temple Region No depletion  ?Clavicle Bone Region Mild depletion  ?Clavicle and Acromion Bone Region No depletion  ?Scapular Bone Region Unable to assess  ?Dorsal Hand No depletion  ?Patellar Region Unable to assess  ?Anterior Thigh Region Unable to assess  ?Posterior Calf Region Unable to assess  ?Edema (RD Assessment) Unable to assess  ?Hair Reviewed  ?Mouth Reviewed  ?Skin Reviewed  ?Nails Reviewed  ? ?  ? ? ?Diet Order:   ?Diet Order   ? ?       ?  Diet Heart Room service appropriate? Yes; Fluid consistency: Thin  Diet effective now       ?  ? ?  ?  ? ?  ? ? ?  EDUCATION NEEDS:  ? ?Education needs have been addressed ? ?Skin:  Skin Assessment: Reviewed RN Assessment ? ?Last BM:  PTA/unknown ? ?Height:  ? ?Ht Readings from Last 1 Encounters:  ?11/02/21 5' 6"  (1.676 m)  ? ? ?Weight:  ? ?Wt Readings from Last 1 Encounters:  ?11/03/21 74.3 kg  ? ? ? ?BMI:  Body mass index is 26.44 kg/m?. ? ?Estimated Nutritional Needs:  ?Kcal:  1800-2000 kcal ?Protein:  90-100 grams ?Fluid:  >/= 1.8 L/day ? ? ? ? ?Jarome Matin, MS, RD, LDN ?Inpatient Clinical Dietitian ?RD pager # available in [place]  ?After  hours/weekend pager # available in [place] ? ?

## 2021-11-04 NOTE — Assessment & Plan Note (Addendum)
-   CT imaging showed possible duodenitis. ?-Continue antibiotics and Protonix. ? ?

## 2021-11-04 NOTE — Assessment & Plan Note (Signed)
-   Resolved.  Troponins flat.  Nonischemic EKG. ?-Outpatient follow-up with cardiology. ?

## 2021-11-04 NOTE — Assessment & Plan Note (Signed)
-   Currently rate controlled.  Continue Xarelto.  Outpatient follow-up with cardiology ?

## 2021-11-04 NOTE — Progress Notes (Signed)
Per [facility], patient is able to have an MRI done there with his pacemaker. If this is needed, MD will need to contact [facility] with details- MRI will then notify cardiology and RN to monitor pacing while MRI is done. [facility] can be reached at [phone].  ?

## 2021-11-04 NOTE — Assessment & Plan Note (Signed)
-   Blood pressure stable.  Continue current home regimen. ?

## 2021-11-04 NOTE — Assessment & Plan Note (Addendum)
-   Questionable cause.  No biliary dilatation on imaging.  Hepatitis profile negative.   ?-GI evaluation appreciated.  LFTs are improving.  Trend LFTs. ?

## 2021-11-04 NOTE — Consult Note (Signed)
[facility] Gastroenterology Consult  Referring Provider: No ref. provider found Primary Care Physician:  Kristen Loader, FNP Primary Gastroenterologist: Dr. [facility]  Reason for Consultation: Abnormal LFTs  HPI: Mario Fisher is a 88 y.o. male was admitted yesterday with complains of chest pain and chills. Patient states he has been in his usual state of health until January when he has had intermittent episodes of chills, 5 episodes of such in the last 2 months.  Yesterday he also had epigastric pain, nausea and 1 episode of vomiting associated with periumbilical pain and came to the ER. He was found to have elevated LFTs, T. bili 3.8/AST 416/ALT 337/ALP 303. WBC 13.8 CT abdomen pelvis with contrast: Status postcholecystectomy, mild amount of fluid and inflammatory stranding along inferior aspect of gallbladder fossa, mild central intrahepatic dilation?  Proximal duodenitis Marked severe atherosclerotic change along superior mesenteric artery with subsequent hemodynamically significant stenosis Ultrasound: Prior cholecystectomy, no intra or extrahepatic biliary ductal dilatation, CBD 4.3 mm  Patient complains of decreased appetite, denies unintentional weight loss, denies acid reflux or heartburn, denies difficulty swallowing or pain on swallowing. He has regular bowel movements, denies blood in stool or black stools.  Prior GI work-up: Colonoscopy 2007 Colonoscopy 2012: Internal and external hemorrhoids, diverticulosis in sigmoid and descending  Past Medical History:  Diagnosis Date   Arthritis    "back" (04/08/2017)   Atrial fibrillation (HCC)    B12 deficiency anemia    BPH (benign prostatic hypertrophy)    Chronic anticoagulation    on Pradaxa, now on xarelto not pradaxa   Chronic lower back pain    Glaucoma, both eyes    Glucose intolerance (impaired glucose tolerance)    History of blood transfusion    "I'm thinking they gave me blood when I had the gallbladder OR"  (04/08/2017)   History of gout    History of hiatal hernia    Hyperlipidemia    Hypertension    OSA on CPAP    Presence of permanent cardiac pacemaker    Rheumatoid arthritis ([place])    Stroke [facility])    remote CVA noted on MRI; "silent stroke; didn't know when it happened" (04/08/2017)    Past Surgical History:  Procedure Laterality Date   CARDIOVERSION  04/08/2017   Procedure: Cardioversion;  Surgeon: Thompson Grayer, MD;  Location: [place] [facility];  Service: Cardiovascular;;   CARDIOVERSION N/A 07/27/2018   Procedure: CARDIOVERSION;  Surgeon: Lelon Perla, MD;  Location: [facility] ENDOSCOPY;  Service: Cardiovascular;  Laterality: N/A;   CATARACT EXTRACTION W/ INTRAOCULAR LENS  IMPLANT, BILATERAL Bilateral    CHOLECYSTECTOMY OPEN     PACEMAKER IMPLANT N/A 04/08/2017   Medtronic Azure XT dual-chamber MRI conditional pacemaker for symptomatic bradycardia by Dr Rayann Heman   US ECHOCARDIOGRAPHY  02/25/2005   ef 55-60%    Prior to Admission medications   Medication Sig Start Date End Date Taking? Authorizing Provider  ACETAMINOPHEN PO Take 650 mg by mouth every 6 (six) hours as needed for moderate pain or headache. PRN   Yes [provider]  allopurinol (ZYLOPRIM) 100 MG tablet Take 100 mg by mouth daily. 09/26/21  Yes [provider]  cyanocobalamin (,VITAMIN B-12,) 1000 MCG/ML injection Inject 1,000 mcg into the muscle every 30 (thirty) days.   Yes [provider]  dorzolamide (TRUSOPT) 2 % ophthalmic solution Place 1 drop into both eyes 2 (two) times daily. 05/11/17  Yes [provider]  Glucosamine-Chondroit-Vit C-Mn (GLUCOSAMINE CHONDR 1500 COMPLX PO) Take 1 tablet by  mouth daily.   Yes [provider]  hydrochlorothiazide (HYDRODIURIL) 12.5 MG tablet Take 12.5 mg by mouth daily as needed (edema). 08/28/21  Yes [provider]  Multiple Vitamins-Minerals (MULTIVITAMIN ADULTS 50+ PO) Take 1 tablet by mouth daily.   Yes [provider]   Omega-3 Fatty Acids (FISH OIL) 1200 MG CAPS Take 1,200-2,400 mg by mouth See admin instructions. Take 2400 mg by mouth in the morning and take 1200 mg by mouth at bedtime   Yes [provider]  Propylene Glycol 0.6 % SOLN Place 1 drop into both eyes 2 (two) times daily.   Yes [provider]  Rivaroxaban (XARELTO) 15 MG TABS tablet Take 1 tablet (15 mg total) by mouth daily with supper. 08/28/21  Yes Nahser, Wonda Cheng, MD  tamsulosin (FLOMAX) 0.4 MG CAPS capsule Take 0.4 mg by mouth daily.   Yes [provider]  Upadacitinib ER (RINVOQ) 15 MG TB24 Take 15 mg by mouth daily.   Yes [provider]  valsartan (DIOVAN) 80 MG tablet Take 40-80 mg by mouth daily. Patient checks BP and if its low will only take 1/2 tab = 40mg    Yes [provider]    Current Facility-Administered Medications  Medication Dose Route Frequency Provider Last Rate Last Admin   acetaminophen (TYLENOL) tablet 650 mg  650 mg Oral Q6H PRN Marylyn Ishihara, Tyrone A, DO       Or   acetaminophen (TYLENOL) suppository 650 mg  650 mg Rectal Q6H PRN Marylyn Ishihara, Tyrone A, DO       allopurinol (ZYLOPRIM) tablet 100 mg  100 mg Oral Daily Kyle, Tyrone A, DO   100 mg at 11/03/21 1358   cefTRIAXone (ROCEPHIN) 2 g in sodium chloride 0.9 % 100 mL IVPB  2 g Intravenous Q24H Kyle, Tyrone A, DO 200 mL/hr at 11/03/21 1601 2 g at 11/03/21 1601   dorzolamide (TRUSOPT) 2 % ophthalmic solution 1 drop  1 drop Both Eyes BID Marylyn Ishihara, Tyrone A, DO   1 drop at 11/03/21 2115   feeding supplement (ENSURE ENLIVE / ENSURE PLUS) liquid 237 mL  237 mL Oral BID BM Kyle, Tyrone A, DO   237 mL at 11/03/21 1610   hydrochlorothiazide (HYDRODIURIL) tablet 12.5 mg  12.5 mg Oral q morning Kyle, Tyrone A, DO       HYDROcodone-acetaminophen (NORCO/VICODIN) 5-325 MG per tablet 1-2 tablet  1-2 tablet Oral Q4H PRN Marylyn Ishihara, Tyrone A, DO       irbesartan (AVAPRO) tablet 75 mg  75 mg Oral Daily Kyle, Tyrone A, DO       metroNIDAZOLE (FLAGYL) IVPB 500 mg   500 mg Intravenous Q12H Kyle, Tyrone A, DO 100 mL/hr at 11/04/21 0129 500 mg at 11/04/21 0129   multivitamin with minerals tablet 1 tablet  1 tablet Oral Daily Marylyn Ishihara, Tyrone A, DO   1 tablet at 11/03/21 1358   omega-3 acid ethyl esters (LOVAZA) capsule 1 g  1 g Oral BID Marylyn Ishihara, Tyrone A, DO   1 g at 11/03/21 2115   ondansetron (ZOFRAN) tablet 4 mg  4 mg Oral Q6H PRN Marylyn Ishihara, Tyrone A, DO       Or   ondansetron (ZOFRAN) injection 4 mg  4 mg Intravenous Q6H PRN Marylyn Ishihara, Tyrone A, DO       polyvinyl alcohol (LIQUIFILM TEARS) 1.4 % ophthalmic solution 1 drop  1 drop Both Eyes BID Marylyn Ishihara, Tyrone A, DO   1 drop at 11/03/21 2117   Rivaroxaban (XARELTO) tablet 15  mg  15 mg Oral Q supper Cherylann Ratel A, DO   15 mg at 11/03/21 1733   tamsulosin (FLOMAX) capsule 0.4 mg  0.4 mg Oral Daily Marylyn Ishihara, Tyrone A, DO   0.4 mg at 11/03/21 1358   Upadacitinib ER TB24 15 mg  15 mg Oral Daily Kyle, Tyrone A, DO        Allergies as of 11/02/2021 - Review Complete 11/02/2021  Allergen Reaction Noted   Statins Other (See Comments) 11/02/2021   Cholesterol Rash 01/16/2021    Family History  Problem Relation Age of Onset   Arrhythmia Mother    Heart attack Father        x2   Hypertension Father    Stroke Father    Heart disease Sister    Hypertension Brother     Social History   Socioeconomic History   Marital status: Married    Spouse name: Not on file   Number of children: Not on file   Years of education: Not on file   Highest education level: Not on file  Occupational History   Not on file  Tobacco Use   Smoking status: Former    Types: Cigars    Quit date: 09/01/1968    Years since quitting: 53.2   Smokeless tobacco: Never  Vaping Use   Vaping Use: Never used  Substance and Sexual Activity   Alcohol use: No   Drug use: No   Sexual activity: Not Currently  Other Topics Concern   Not on file  Social History Narrative   Not on file   Social Determinants of Health   Financial Resource Strain: Not on  file  Food Insecurity: Not on file  Transportation Needs: Not on file  Physical Activity: Not on file  Stress: Not on file  Social Connections: Not on file  Intimate Partner Violence: Not on file    Review of Systems: Positive for: GI: Described in detail in HPI.    Gen: chills, denies any fever,  rigors, night sweats, anorexia, fatigue, weakness, malaise, involuntary weight loss, and sleep disorder CV: chest pain, denies  angina, palpitations, syncope, orthopnea, PND, peripheral edema, and claudication. Resp: Denies dyspnea, cough, sputum, wheezing, coughing up blood. GU : Denies urinary burning, blood in urine, urinary frequency, urinary hesitancy, nocturnal urination, and urinary incontinence. MS: Joint pain from RA.  Denies muscle weakness, cramps, atrophy.  Derm: Denies rash, itching, oral ulcerations, hives, unhealing ulcers.  Psych: Denies depression, anxiety, memory loss, suicidal ideation, hallucinations,  and confusion. Heme: Denies bruising, bleeding, and enlarged lymph nodes. Neuro:  Denies any headaches, dizziness, paresthesias. Endo:  Denies any problems with DM, thyroid, adrenal function.  Physical Exam: Vital signs in last 24 hours: Temp:  [97.6 F (36.4 C)-98.8 F (37.1 C)] 98.7 F (37.1 C) (03/06 0529) Pulse Rate:  [59-60] 60 (03/06 0529) Resp:  [14-18] 15 (03/06 0529) BP: (119-133)/(71-75) 133/74 (03/06 0529) SpO2:  [98 %-100 %] 98 % (03/06 0529)    General:   Alert,  Well-developed, well-nourished, pleasant and cooperative in NAD Head:  Normocephalic and atraumatic. Eyes: Mild icterus, mild pallor Ears:  Normal auditory acuity. Nose:  No deformity, discharge,  or lesions. Mouth:  No deformity or lesions.  Oropharynx pink & moist. Neck:  Supple; no masses or thyromegaly. Lungs:  Clear throughout to auscultation.   No wheezes, crackles, or rhonchi. No acute distress. Heart:  Regular rate and rhythm; no murmurs, clicks, rubs,  or gallops. Extremities:   Without clubbing or edema.  Neurologic:  Alert and  oriented x4;  grossly normal neurologically. Skin:  Intact without significant lesions or rashes. Psych:  Alert and cooperative. Normal mood and affect. Abdomen:  Soft, nontender and nondistended. No masses, hepatosplenomegaly or hernias noted. Normal bowel sounds, without guarding, and without rebound.         Lab Results: Recent Labs    11/02/21 2339 11/04/21 0422  WBC 13.8* 9.2  HGB 11.6* 9.9*  HCT 34.1* 29.8*  PLT 175 143*   BMET Recent Labs    11/02/21 2339 11/04/21 0422  NA 131* 134*  K 3.9 3.9  CL 100 104  CO2 21* 22  GLUCOSE 141* 86  BUN 21 24*  CREATININE 1.30* 1.25*  CALCIUM 9.3 9.0   LFT Recent Labs    11/03/21 0939 11/04/21 0422  PROT  --  6.2*  ALBUMIN  --  3.3*  AST  --  134*  ALT  --  206*  ALKPHOS  --  233*  BILITOT 5.3* 5.6*  BILIDIR 3.3*  --   IBILI 2.0*  --    PT/INR No results for input(s): LABPROT, INR in the last 72 hours.  Studies/Results: CT Abdomen Pelvis W Contrast  Result Date: 11/03/2021 CLINICAL DATA:  Chest pain and chills with back pain, low-grade fever and vomiting. EXAM: CT ABDOMEN AND PELVIS WITH CONTRAST TECHNIQUE: Multidetector CT imaging of the abdomen and pelvis was performed using the standard protocol following bolus administration of intravenous contrast. RADIATION DOSE REDUCTION: This exam was performed according to the departmental dose-optimization program which includes automated exposure control, adjustment of the mA and/or kV according to patient size and/or use of iterative reconstruction technique. CONTRAST:  157mL OMNIPAQUE IOHEXOL 300 MG/ML  SOLN COMPARISON:  None. FINDINGS: Lower chest: Mild areas of atelectasis and/or early infiltrate are seen within the posterior aspect of the left lung base and anteromedial aspect of the right lower lobe. Hepatobiliary: No focal liver abnormality is seen. Status post cholecystectomy, with a mild amount of fluid and inflammatory  stranding seen along the inferior aspect of the gallbladder fossa. This is seen in between the medial aspect of the right lobe of the liver and first part of the proximal duodenum (axial CT images 26 through 29, CT series 2). Mild central intrahepatic biliary dilatation is noted. Pancreas: Unremarkable. No pancreatic ductal dilatation or surrounding inflammatory changes. Spleen: Normal in size without focal abnormality. Adrenals/Urinary Tract: Adrenal glands are unremarkable. Kidneys are normal in size, without renal calculi or hydronephrosis. A 7 mm cystic appearing focus is seen within the lower pole of the right kidney. The urinary bladder is partially contracted. Mild diffuse urinary bladder wall thickening is seen. Stomach/Bowel: Stomach is within normal limits. Appendix appears normal. No evidence of bowel wall thickening, distention, or inflammatory changes. Noninflamed diverticula are seen throughout the sigmoid colon Vascular/Lymphatic: Aortic atherosclerosis. Marked severity atherosclerotic changes are seen along the origin and proximal portion of the superior mesenteric artery (approximately 15 mm in length) with subsequent hemodynamically significant stenosis (best seen on coronal reformatted images 46 through 51, CT series 5/sagittal reformatted image 75, CT series 6). 18 mm x 10 mm x 25 mm and 25 mm x 10 mm x 16 mm central superior mesenteric lymph nodes are seen (axial CT images 21 through 25, CT series 2). Reproductive: There is moderate to marked severity prostate gland enlargement. Mass effect is seen on the adjacent portion of the urinary bladder. Other: No abdominal wall hernia or abnormality. No abdominopelvic ascites. Musculoskeletal: There is  marked severity dextroscoliosis of the mid to lower lumbar spine with marked severity multilevel degenerative changes. IMPRESSION: 1. Status post cholecystectomy, with fluid and inflammatory stranding seen along the inferior aspect of the gallbladder  fossa and adjacent portion of the right lobe of the liver, as described above. This is likely secondary to proximal duodenitis without evidence of perforation. 2. Marked severity atherosclerotic changes along the origin and proximal portion of the superior mesenteric artery with subsequent hemodynamically significant stenosis. Further evaluation with conventional angiography is recommended. 3. Sigmoid diverticulosis. 4. Moderate to marked severity prostatomegaly. Correlation with PSA values is recommended. 5. Mild posterior left basilar and anteromedial right lower lobe atelectasis and/or early infiltrate. 6. Marked severity dextroscoliosis of the mid to lower lumbar spine with marked severity multilevel degenerative changes. Aortic Atherosclerosis (ICD10-I70.0). Electronically Signed   By: Virgina Norfolk M.D.   On: 11/03/2021 01:44   DG Chest Port 1 View  Result Date: 11/02/2021 CLINICAL DATA:  Fever. EXAM: PORTABLE CHEST 1 VIEW COMPARISON:  Chest CT 09/03/2017 FINDINGS: Left-sided pacemaker in place. Mild cardiomegaly. Normal mediastinal contours. No confluent consolidation or evidence of pneumonia. No pulmonary edema no pleural fluid or pneumothorax. IMPRESSION: 1. Mild cardiomegaly. No evidence of pneumonia or explanation for fever. 2. Left-sided pacemaker in place. Electronically Signed   By: Keith Rake M.D.   On: 11/02/2021 23:43   US Abdomen Limited RUQ (LIVER/GB)  Result Date: 11/03/2021 CLINICAL DATA:  Elevated LFTs. EXAM: ULTRASOUND ABDOMEN LIMITED RIGHT UPPER QUADRANT COMPARISON:  11/03/2021 CT abdomen FINDINGS: Gallbladder: Surgically absent Common bile duct: Diameter: 4.3 mm Liver: No focal lesion identified. Within normal limits in parenchymal echogenicity. Portal vein is patent on color Doppler imaging with normal direction of blood flow towards the liver. Other: None. IMPRESSION: 1. Prior cholecystectomy. 2. No intrahepatic or extrahepatic biliary ductal dilatation. Electronically  Signed   By: Kathreen Devoid M.D.   On: 11/03/2021 14:28    Impression: Chills, low-grade fever, abdominal pain, nausea, 1 episode of vomiting,chest pain Elevated LFTs T. bili 5.6/AST 134/ALT 206/ALP 233 with unremarkable ultrasound and CAT scan ?  Ischemic hepatitis (labs negative for hepatitis A, hepatitis B, hepatitis C) Bacteremia-E. Coli  Atherosclerosis of mesenteric arteries-vascular ultrasound results pending Atrial fibrillation on Xarelto Sick sinus syndrome, status post PPM Hypertension Dyslipidemia Ankylosing spondylitis and rheumatoid arthritis Chronic kidney disease stage III BPH  Plan: Unclear etiology of abnormal LFTs Has been started on IV ceftriaxone and IV Flagyl for E. coli bacteremia If LFTs worsen, consider MRCP(unclear if this can be done with patient's permanent pacemaker, this will need to be reviewed with radiology for compatibility with MRI).  Will check for other causes of elevated LFTs- ceruloplasmin/alpha 1 antitrypsin, iron panel and ferritin, ASMA and AMA.   LOS: 1 day   Ronnette Juniper, MD  11/04/2021, 10:38 AM

## 2021-11-04 NOTE — Assessment & Plan Note (Addendum)
-   Mild. Sodium 134 today as well.  Monitor. ?

## 2021-11-04 NOTE — Assessment & Plan Note (Addendum)
-  Follow sensitivities.  Patient has had on and off fever for almost 2 months.  He also has pacemaker.  UA negative for UTI.  LFTs are elevated but improving and there are no signs of cholangitis. ?-Continue current antibiotics.  ID following.  2D echo showed EF of 50 to 55% with indeterminate left ventricular diastolic parameters with no evidence of vegetation ?-Afebrile for the last 24 hours. ?

## 2021-11-04 NOTE — Progress Notes (Signed)
The patient is receiving Protonix by the intravenous route.  Based on criteria approved by the Pharmacy and [place], the medication is being converted to the equivalent oral dose form. ? ?These criteria include: ?-No active GI bleeding ?-Able to tolerate diet of full liquids (or better) or tube feeding ?-Able to tolerate other medications by the oral or enteral route ? ?If you have any questions about this conversion, please contact the Pharmacy Department (phone [phone]).  Thank you.  ? ? ?Addendum: ? ?DC upadacitinib in pt w/ bacteremia> d/w Dr Starla Link ? ?Eudelia Bunch, Pharm.D ? ?11/04/2021 12:16 PM ? ?

## 2021-11-04 NOTE — Assessment & Plan Note (Addendum)
Ankylosing spondylitis ?-Home regimen on hold due to bacteremia.  Outpatient follow-up. ?

## 2021-11-04 NOTE — Plan of Care (Signed)
°  Problem: Activity: °Goal: Risk for activity intolerance will decrease °Outcome: Progressing °  °Problem: Elimination: °Goal: Will not experience complications related to bowel motility °Outcome: Progressing °Goal: Will not experience complications related to urinary retention °Outcome: Progressing °  °

## 2021-11-04 NOTE — Progress Notes (Signed)
Mesenteric duplex has been completed. ? ?Results can be found under chart review under CV PROC. ?11/04/2021 10:45 AM ?Blease Capaldi RVT, RDMS ? ?

## 2021-11-04 NOTE — Hospital Course (Addendum)
88 y.o. male with medical history significant for paroxysmal Afib, SSS s/p PPM, HTN, HLD, ankylosing spondylosis, RA, CKD3a presented with on and off fevers and chills for the last couple of months along with abdominal pain after eating.  On presentation, sodium 131, creatinine 1.3, AST 416, ALT 337, total bili of 3.8, WBC 13.8.  CT of abdomen/pelvis with contrast showed findings secondary to proximal duodenitis without perforation along with severe atherosclerosis in proximal portion of superior mesenteric artery with subsequent hemodynamically significant stenosis.  Patient was started on IV antibiotics.  GI was consulted.  He was found to have E. coli bacteremia: ID was consulted. ?

## 2021-11-04 NOTE — Assessment & Plan Note (Addendum)
Atherosclerosis of mesenteric arteries ?-As noted on CT abdomen/pelvis ?-Admitting provider discussed with Dr. Gigi Ginobbins/vascular surgery who recommended to treat duodenitis, check mesenteric duplex and outpatient follow-up with vascular surgery. ?-Mesenteric duplex ultrasound showed 70 to 99% stenosis in the superior mesenteric artery. ?

## 2021-11-04 NOTE — Progress Notes (Signed)
?Progress Note ? ? ?Patient: Mario Fisher [id number] DOB: 03-Feb-1934 DOA: 11/02/2021     1 ?DOS: the patient was seen and examined on 11/04/2021 ?  ?Brief hospital course: ?88 y.o. male with medical history significant for paroxysmal Afib, SSS s/p PPM, HTN, HLD, ankylosing spondylosis, RA, CKD3a presented with on and off fevers and chills for the last couple of months along with abdominal pain after eating.  On presentation, sodium 131, creatinine 1.3, AST 416, ALT 337, total bili of 3.8, WBC 13.8.  CT of abdomen/pelvis with contrast showed findings secondary to proximal duodenitis without perforation along with severe atherosclerosis in proximal portion of superior mesenteric artery with subsequent hemodynamically significant stenosis.  Patient was started on IV antibiotics. ? ?Assessment and Plan: ?E coli bacteremia ?-BCID positive for E. coli.  Follow sensitivities.  Patient has had on and off fever for almost 2 months.  He also has pacemaker.  UA negative for UTI.  LFTs are elevated but no signs of cholangitis. ?-Continue current antibiotics.  Consulted ID.  Follow recommendations. ? ?Duodenitis ?- CT imaging showed possible duodenitis. ?-Continue antibiotics.  Add Protonix. ? ? ?Elevated LFTs ?- Questionable cause.  No biliary dilatation on imaging.  Hepatitis profile negative.  Bilirubin continues to worsen. ?-I have consulted GI.  Follow recommendations.  Repeat a.m. LFTs. ? ?Paroxysmal atrial fibrillation ([place]) ?- Currently rate controlled.  Continue Xarelto.  Outpatient follow-up with cardiology ? ?Hyponatremia ?- Mild.  Improving.  Sodium 134 today.  Monitor. ? ?Atherosclerosis ?Atherosclerosis of mesenteric arteries ?-As noted on CT abdomen/pelvis ?-Admitting provider discussed with Dr. Aundra Dubin surgery who recommended to treat duodenitis, check mesenteric duplex and outpatient follow-up with vascular surgery. ?-Follow-up mesenteric duplex ? ?Stage 3a chronic kidney disease (CKD) ([place]) ?-  Creatinine stable.  Monitor ? ?Rheumatoid arthritis ([place]) ?Ankylosing spondylitis ?-Continue home regimen.  Outpatient follow-up. ? ?Normocytic anemia ?- Hemoglobin stable.  Monitor intermittently. ? ?HTN (hypertension) ?- Blood pressure stable.  Continue current home regimen. ? ?Chest pain ?- Resolved.  Troponins flat.  Nonischemic EKG. ?-Outpatient follow-up with cardiology. ? ?BPH (benign prostatic hyperplasia) ?- Continue Flomax. ? ? ?Subjective:  ?Patient seen and examined at bedside.  Feels slightly better.  Still has mild intermittent abdominal pain after eating.  No chest pain, worsening shortness of breath, vomiting reported. ? ?Physical Exam: ?Vitals:  ? 11/03/21 1229 11/03/21 1721 11/03/21 2008 11/04/21 0529  ?BP: 130/71 130/75 119/75 133/74  ?Pulse: 60 (!) 59 (!) 59 60  ?Resp: 14 18 16 15   ?Temp: 98.6 ?F (37 ?C) 97.6 ?F (36.4 ?C) 98.8 ?F (37.1 ?C) 98.7 ?F (37.1 ?C)  ?TempSrc: Oral Oral Oral Oral  ?SpO2: 100% 100% 98% 98%  ?Weight:      ?Height:      ? ?General: No acute distress, currently on room air ?ENT/neck: No elevated JVD.  No obvious masses  ?respiratory: Bilateral decreased breath sounds at bases with some scattered crackles ?CVS: S1-S2 heard, rate controlled ?Abdominal: Soft, nontender, nondistended, no organomegaly, bowel sounds heard ?Extremities: No cyanosis, clubbing, edema ?CNS: Alert, awake and oriented.  No focal neurologic deficit.  Moving extremities. ?Lymph: No cervical lymphadenopathy ?Skin: No rashes, lesions, ulcers ?Psych: Affect is mostly flat.  Currently not agitated.   ?Musculoskeletal: No obvious joint deformity/tenderness/swelling ? ?Data Reviewed: ?I have myself reviewed patient's investigation during this hospitalization including blood work and imaging studies.  Today, sodium 134, creatinine 1.25, alkaline phosphatase 233, AST 134, ALT 206, total bili of 5.6, WBCs 9.2, hemoglobin 9.9, platelets 143 ? ? ?Family  Communication: None at bedside ? ?Disposition: ?Status is:  Inpatient ?Remains inpatient appropriate because: Of severity of illness.  Need for IV antibiotics ? Planned Discharge Destination: Home ? ? ?Author: ?Aline August, MD ?11/04/2021 10:41 AM ? ?For on call review [email or web address].  ?

## 2021-11-04 NOTE — Progress Notes (Signed)
? ?  Echocardiogram ?2D Echocardiogram has been performed. ? ?Mario Fisher ?11/04/2021, 3:39 PM ?

## 2021-11-04 NOTE — Assessment & Plan Note (Signed)
-   Creatinine stable.  Monitor ?

## 2021-11-05 DIAGNOSIS — E876 Hypokalemia: Secondary | ICD-10-CM

## 2021-11-05 LAB — CBC WITH DIFFERENTIAL/PLATELET
Abs Immature Granulocytes: 0.03 10*3/uL (ref 0.00–0.07)
Basophils Absolute: 0 10*3/uL (ref 0.0–0.1)
Basophils Relative: 0 %
Eosinophils Absolute: 0 10*3/uL (ref 0.0–0.5)
Eosinophils Relative: 1 %
HCT: 28.5 % — ABNORMAL LOW (ref 39.0–52.0)
Hemoglobin: 10 g/dL — ABNORMAL LOW (ref 13.0–17.0)
Immature Granulocytes: 1 %
Lymphocytes Relative: 17 %
Lymphs Abs: 0.9 10*3/uL (ref 0.7–4.0)
MCH: 32.8 pg (ref 26.0–34.0)
MCHC: 35.1 g/dL (ref 30.0–36.0)
MCV: 93.4 fL (ref 80.0–100.0)
Monocytes Absolute: 0.5 10*3/uL (ref 0.1–1.0)
Monocytes Relative: 8 %
Neutro Abs: 4.2 10*3/uL (ref 1.7–7.7)
Neutrophils Relative %: 73 %
Platelets: 152 10*3/uL (ref 150–400)
RBC: 3.05 MIL/uL — ABNORMAL LOW (ref 4.22–5.81)
RDW: 15.4 % (ref 11.5–15.5)
WBC: 5.6 10*3/uL (ref 4.0–10.5)
nRBC: 0 % (ref 0.0–0.2)

## 2021-11-05 LAB — COMPREHENSIVE METABOLIC PANEL
ALT: 146 U/L — ABNORMAL HIGH (ref 0–44)
AST: 69 U/L — ABNORMAL HIGH (ref 15–41)
Albumin: 3.1 g/dL — ABNORMAL LOW (ref 3.5–5.0)
Alkaline Phosphatase: 214 U/L — ABNORMAL HIGH (ref 38–126)
Anion gap: 8 (ref 5–15)
BUN: 17 mg/dL (ref 8–23)
CO2: 24 mmol/L (ref 22–32)
Calcium: 8.9 mg/dL (ref 8.9–10.3)
Chloride: 102 mmol/L (ref 98–111)
Creatinine, Ser: 1.11 mg/dL (ref 0.61–1.24)
GFR, Estimated: >60 mL/min (ref >60)
Glucose, Bld: 96 mg/dL (ref 70–99)
Potassium: 3.3 mmol/L — ABNORMAL LOW (ref 3.5–5.1)
Sodium: 134 mmol/L — ABNORMAL LOW (ref 135–145)
Total Bilirubin: 3.2 mg/dL — ABNORMAL HIGH (ref 0.3–1.2)
Total Protein: 6.2 g/dL — ABNORMAL LOW (ref 6.5–8.1)

## 2021-11-05 LAB — ALPHA-1-ANTITRYPSIN: A-1 Antitrypsin, Ser: 143 mg/dL (ref 101–187)

## 2021-11-05 LAB — CERULOPLASMIN: Ceruloplasmin: 15.3 mg/dL — ABNORMAL LOW (ref 16.0–31.0)

## 2021-11-05 LAB — ANTI-SMOOTH MUSCLE ANTIBODY, IGG: F-Actin IgG: 10 Units (ref 0–19)

## 2021-11-05 LAB — MAGNESIUM: Magnesium: 1.7 mg/dL (ref 1.7–2.4)

## 2021-11-05 LAB — MITOCHONDRIAL ANTIBODIES: Mitochondrial M2 Ab, IgG: <20 Units (ref 0.0–20.0)

## 2021-11-05 MED ORDER — POLYETHYLENE GLYCOL 3350 17 G PO PACK
17.0000 g | PACK | Freq: Every day | ORAL | Status: DC | PRN
Start: 2021-11-05 — End: 2021-11-06

## 2021-11-05 MED ORDER — POTASSIUM CHLORIDE CRYS ER 20 MEQ PO TBCR
40.0000 meq | EXTENDED_RELEASE_TABLET | Freq: Once | ORAL | Status: AC
  Administered 2021-11-05: 40 meq via ORAL
  Filled 2021-11-05: qty 2

## 2021-11-05 MED ORDER — SODIUM CHLORIDE 0.9 % IV SOLN: INTRAVENOUS | Status: DC | PRN

## 2021-11-05 NOTE — Progress Notes (Signed)
[facility] Progress Note ? ?Mario Fisher 88 y.o. 07/25/1934 ? ?CC: Elevated LFTs ? ? ?Subjective: ?Patient states he is doing well today.  Denies pain.  Denies nausea/vomiting.  States he does feel little constipated.  Wife notes that patient appears less yellow today. ? ?ROS : Review of Systems  ?Gastrointestinal:  Negative for abdominal pain, blood in stool, constipation, diarrhea, heartburn, melena, nausea and vomiting.  ?Genitourinary:  Negative for dysuria and urgency.   ? ? ?Objective: ?Vital signs in last 24 hours: ?Vitals:  ? 11/04/21 1945 11/05/21 0431  ?BP: (!) 150/87 (!) 150/78  ?Pulse: (!) 58 60  ?Resp: 16 18  ?Temp: 98 ?F (36.7 ?C) 98.2 ?F (36.8 ?C)  ?SpO2: 100% 97%  ? ? ?Physical Exam: ? ?General:  Alert, cooperative, no distress, wife at bedside  ?Head:  Normocephalic, without obvious abnormality, atraumatic  ?Eyes:  Mild scleral icterus, EOM's intact  ?Lungs:   Clear to auscultation bilaterally, respirations unlabored  ?Heart:  Regular rate and rhythm, S1, S2 normal  ?Abdomen:   Soft, non-tender, bowel sounds active all four quadrants,  no masses,   ? ? ?Lab Results: ?Recent Labs  ?  11/04/21 ?0422 11/05/21 ?0449  ?NA 134* 134*  ?K 3.9 3.3*  ?CL 104 102  ?CO2 22 24  ?GLUCOSE 86 96  ?BUN 24* 17  ?CREATININE 1.25* 1.11  ?CALCIUM 9.0 8.9  ?MG  --  1.7  ? ?Recent Labs  ?  11/04/21 ?0422 11/05/21 ?0449  ?AST 134* 69*  ?ALT 206* 146*  ?ALKPHOS 233* 214*  ?BILITOT 5.6* 3.2*  ?PROT 6.2* 6.2*  ?ALBUMIN 3.3* 3.1*  ? ?Recent Labs  ?  11/02/21 ?2339 11/04/21 ?0422 11/05/21 ?0449  ?WBC 13.8* 9.2 5.6  ?NEUTROABS 12.4*  --  4.2  ?HGB 11.6* 9.9* 10.0*  ?HCT 34.1* 29.8* 28.5*  ?MCV 94.5 95.2 93.4  ?PLT 175 143* 152  ? ?No results for input(s): LABPROT, INR in the last 72 hours. ? ? ? ?Assessment ?Elevated LFTs ?-Ultrasound and CAT scan, unremarkable. ?-AST 69/ALT 146/alk phos 214, improving ?-Total bilirubin 3.2, improving ?-Leukocytosis with WBC 13.8 ?-Labs negative for hepatitis A, hepatitis B, and  hepatitis C ?-Ceruloplasmin 15.3 ?-alpha 1 antitrypsin unremarkable ?-ASMA, AMA still pending ?-Iron studies normal ? ?Bacteremia E. Coli ? ?Atrial fibrillation on Xarelto ? ?Sick sinus syndrome, s/p PPM ? ? ?Plan: ?LFTs trending down, will hold off on MRCP at this time ?Continue IV ceftriaxone and IV Flagyl for E. coli bacteremia ?ASMA/AMA still pending.  Otherwise unremarkable liver studies ?[facility] will follow ? ? M  PA-C ?11/05/2021, 8:56 AM ? ?Contact #  [phone]  ?

## 2021-11-05 NOTE — Plan of Care (Signed)
  Problem: Coping: Goal: Level of anxiety will decrease Outcome: Progressing   Problem: Elimination: Goal: Will not experience complications related to bowel motility Outcome: Progressing   Problem: Pain Managment: Goal: General experience of comfort will improve Outcome: Progressing   Problem: Safety: Goal: Ability to remain free from injury will improve Outcome: Progressing   

## 2021-11-05 NOTE — Progress Notes (Addendum)
End of shift ? ?Pt's labs improving, no complaints of pain, N/V.  Anticipate pt d/c 3/8.  ?

## 2021-11-05 NOTE — Assessment & Plan Note (Signed)
-   Replace.  Repeat a.m. labs. °

## 2021-11-05 NOTE — Progress Notes (Addendum)
?Progress Note ? ? ?Patient: Mario Fisher [id number] DOB: 03-26-1934 DOA: 11/02/2021     2 ?DOS: the patient was seen and examined on 11/05/2021 ?  ?Brief hospital course: ?88 y.o. male with medical history significant for paroxysmal Afib, SSS s/p PPM, HTN, HLD, ankylosing spondylosis, RA, CKD3a presented with on and off fevers and chills for the last couple of months along with abdominal pain after eating.  On presentation, sodium 131, creatinine 1.3, AST 416, ALT 337, total bili of 3.8, WBC 13.8.  CT of abdomen/pelvis with contrast showed findings secondary to proximal duodenitis without perforation along with severe atherosclerosis in proximal portion of superior mesenteric artery with subsequent hemodynamically significant stenosis.  Patient was started on IV antibiotics.  GI was consulted.  He was found to have E. coli bacteremia: ID was consulted. ? ?Assessment and Plan: ?E coli bacteremia ?-Follow sensitivities.  Patient has had on and off fever for almost 2 months.  He also has pacemaker.  UA negative for UTI.  LFTs are elevated but improving and there are no signs of cholangitis. ?-Continue current antibiotics.  ID following.  2D echo showed EF of 50 to 55% with indeterminate left ventricular diastolic parameters with no evidence of vegetation ?-Afebrile for the last 24 hours. ? ?Duodenitis ?- CT imaging showed possible duodenitis. ?-Continue antibiotics and Protonix. ? ? ?Elevated LFTs ?- Questionable cause.  No biliary dilatation on imaging.  Hepatitis profile negative.   ?-GI evaluation appreciated.  LFTs are improving.  Trend LFTs. ? ?Paroxysmal atrial fibrillation ([place]) ?- Currently rate controlled.  Continue Xarelto.  Outpatient follow-up with cardiology ? ?Hypokalemia ?- Replace.  Repeat a.m. labs ? ?Hyponatremia ?- Mild. Sodium 134 today as well.  Monitor. ? ?Atherosclerosis ?Atherosclerosis of mesenteric arteries ?-As noted on CT abdomen/pelvis ?-Admitting provider discussed with Dr.  Aundra Dubin surgery who recommended to treat duodenitis, check mesenteric duplex and outpatient follow-up with vascular surgery. ?-Mesenteric duplex ultrasound showed 70 to 99% stenosis in the superior mesenteric artery. ? ?Stage 3a chronic kidney disease (CKD) ([place]) ?- Creatinine stable.  Monitor ? ?Rheumatoid arthritis ([place]) ?Ankylosing spondylitis ?-Home regimen on hold due to bacteremia.  Outpatient follow-up. ? ?Normocytic anemia ?- Hemoglobin stable.  Monitor intermittently. ? ?HTN (hypertension) ?- Blood pressure stable.  Continue current home regimen. ? ?Chest pain ?- Resolved.  Troponins flat.  Nonischemic EKG. ?-Outpatient follow-up with cardiology. ? ?BPH (benign prostatic hyperplasia) ?- Continue Flomax. ? ? ?Subjective:  ?Patient seen and examined at bedside.  Denies worsening abdominal pain, vomiting or fever. ? ?Physical Exam: ?Vitals:  ? 11/04/21 1221 11/04/21 1945 11/05/21 0431 11/05/21 0433  ?BP: (!) 169/90 (!) 150/87 (!) 150/78   ?Pulse: 60 (!) 58 60   ?Resp: 16 16 18    ?Temp: 97.8 ?F (36.6 ?C) 98 ?F (36.7 ?C) 98.2 ?F (36.8 ?C)   ?TempSrc: Oral Oral Oral   ?SpO2: 100% 100% 97%   ?Weight:    70 kg  ?Height:      ? ?General: On room air.  No distress ?ENT/neck: No thyromegaly.  JVD is not elevated  ?respiratory: Decreased breath sounds at bases bilaterally with some crackles; no wheezing CVS: S1-S2 heard, rate controlled ?Abdominal: Soft, nontender, slightly distended; no organomegaly, normal bowel sounds are heard ?Extremities: Trace lower extremity edema; no cyanosis  ?CNS: Awake and alert.  No focal neurologic deficit.  Moves extremities ?Lymph: No obvious lymphadenopathy ?Skin: No obvious ecchymosis/lesions  ?psych: Affect, judgment and mood are normal  ?musculoskeletal: No obvious joint swelling/deformity ? ?Data Reviewed: ?I  have myself reviewed patient's investigation during this hospitalization including blood work and imaging studies.  Today, sodium 134, potassium 3.3, creatinine 1.11,  ALP 214, AST 69, ALT 146, total bili of 3.2, WBC 5.6, hemoglobin 10, platelets 152 ? ?Family Communication: Wife at bedside. Daughter on phone ?  ?Disposition: ?Status is: Inpatient ?Remains inpatient appropriate because: Of severity of illness.  Need for IV antibiotics ? Planned Discharge Destination: Home in 1 to 2 days if remains afebrile and hemodynamically stable and cleared by consultants ? ? ? ? ?Author: ?Aline August, MD ?11/05/2021 7:39 AM ? ?For on call review [email or web address].  ?

## 2021-11-05 NOTE — Plan of Care (Signed)

## 2021-11-06 ENCOUNTER — Other Ambulatory Visit (HOSPITAL_COMMUNITY): Payer: Self-pay

## 2021-11-06 DIAGNOSIS — M069 Rheumatoid arthritis, unspecified: Secondary | ICD-10-CM

## 2021-11-06 LAB — COMPREHENSIVE METABOLIC PANEL
ALT: 121 U/L — ABNORMAL HIGH (ref 0–44)
AST: 46 U/L — ABNORMAL HIGH (ref 15–41)
Albumin: 3.5 g/dL (ref 3.5–5.0)
Alkaline Phosphatase: 243 U/L — ABNORMAL HIGH (ref 38–126)
Anion gap: 9 (ref 5–15)
BUN: 11 mg/dL (ref 8–23)
CO2: 25 mmol/L (ref 22–32)
Calcium: 9.2 mg/dL (ref 8.9–10.3)
Chloride: 103 mmol/L (ref 98–111)
Creatinine, Ser: 1.05 mg/dL (ref 0.61–1.24)
GFR, Estimated: >60 mL/min (ref >60)
Glucose, Bld: 107 mg/dL — ABNORMAL HIGH (ref 70–99)
Potassium: 3.8 mmol/L (ref 3.5–5.1)
Sodium: 137 mmol/L (ref 135–145)
Total Bilirubin: 2.2 mg/dL — ABNORMAL HIGH (ref 0.3–1.2)
Total Protein: 6.8 g/dL (ref 6.5–8.1)

## 2021-11-06 LAB — CBC WITH DIFFERENTIAL/PLATELET
Abs Immature Granulocytes: 0.05 10*3/uL (ref 0.00–0.07)
Basophils Absolute: 0 10*3/uL (ref 0.0–0.1)
Basophils Relative: 1 %
Eosinophils Absolute: 0 10*3/uL (ref 0.0–0.5)
Eosinophils Relative: 1 %
HCT: 35 % — ABNORMAL LOW (ref 39.0–52.0)
Hemoglobin: 11.7 g/dL — ABNORMAL LOW (ref 13.0–17.0)
Immature Granulocytes: 1 %
Lymphocytes Relative: 31 %
Lymphs Abs: 1.2 10*3/uL (ref 0.7–4.0)
MCH: 31.8 pg (ref 26.0–34.0)
MCHC: 33.4 g/dL (ref 30.0–36.0)
MCV: 95.1 fL (ref 80.0–100.0)
Monocytes Absolute: 0.5 10*3/uL (ref 0.1–1.0)
Monocytes Relative: 12 %
Neutro Abs: 2.2 10*3/uL (ref 1.7–7.7)
Neutrophils Relative %: 54 %
Platelets: 200 10*3/uL (ref 150–400)
RBC: 3.68 MIL/uL — ABNORMAL LOW (ref 4.22–5.81)
RDW: 15.7 % — ABNORMAL HIGH (ref 11.5–15.5)
WBC: 4 10*3/uL (ref 4.0–10.5)
nRBC: 0 % (ref 0.0–0.2)

## 2021-11-06 LAB — CULTURE, BLOOD (ROUTINE X 2): Special Requests: ADEQUATE

## 2021-11-06 LAB — MAGNESIUM: Magnesium: 1.6 mg/dL — ABNORMAL LOW (ref 1.7–2.4)

## 2021-11-06 MED ORDER — CEFADROXIL 500 MG PO CAPS
1000.0000 mg | ORAL_CAPSULE | Freq: Two times a day (BID) | ORAL | 0 refills | Status: AC
  Filled 2021-11-06: qty 46, 12d supply, fill #0

## 2021-11-06 MED ORDER — CEFADROXIL 500 MG PO CAPS
1000.0000 mg | ORAL_CAPSULE | Freq: Two times a day (BID) | ORAL | Status: DC
  Filled 2021-11-06: qty 2

## 2021-11-06 MED ORDER — METRONIDAZOLE 500 MG PO TABS: 500.0000 mg | ORAL_TABLET | Freq: Two times a day (BID) | ORAL | Status: DC

## 2021-11-06 MED ORDER — PANTOPRAZOLE SODIUM 40 MG PO TBEC
40.0000 mg | DELAYED_RELEASE_TABLET | Freq: Every day | ORAL | 1 refills | Status: DC
  Filled 2021-11-06: qty 30, 30d supply, fill #0

## 2021-11-06 MED ORDER — MAGNESIUM SULFATE 2 GM/50ML IV SOLN
2.0000 g | Freq: Once | INTRAVENOUS | Status: AC
Start: 2021-11-06 — End: 2021-11-06
  Administered 2021-11-06: 2 g via INTRAVENOUS
  Filled 2021-11-06: qty 50

## 2021-11-06 MED ORDER — METRONIDAZOLE 500 MG PO TABS
500.0000 mg | ORAL_TABLET | Freq: Two times a day (BID) | ORAL | 0 refills | Status: AC
  Filled 2021-11-06: qty 23, 12d supply, fill #0

## 2021-11-06 NOTE — Plan of Care (Signed)
Patient discharged home with Son via private vehicle. AVS and medications and regimen reviewed.  ?Problem: Education: ?Goal: Knowledge of General Education information will improve ?Description: Including pain rating scale, medication(s)/side effects and non-pharmacologic comfort measures ?Outcome: Adequate for Discharge ?  ?Problem: Health Behavior/Discharge Planning: ?Goal: Ability to manage health-related needs will improve ?Outcome: Adequate for Discharge ?  ?Problem: Clinical Measurements: ?Goal: Ability to maintain clinical measurements within normal limits will improve ?Outcome: Adequate for Discharge ?Goal: Will remain free from infection ?Outcome: Adequate for Discharge ?Goal: Diagnostic test results will improve ?Outcome: Adequate for Discharge ?Goal: Respiratory complications will improve ?Outcome: Adequate for Discharge ?Goal: Cardiovascular complication will be avoided ?Outcome: Adequate for Discharge ?  ?Problem: Activity: ?Goal: Risk for activity intolerance will decrease ?Outcome: Adequate for Discharge ?  ?Problem: Nutrition: ?Goal: Adequate nutrition will be maintained ?Outcome: Adequate for Discharge ?  ?Problem: Coping: ?Goal: Level of anxiety will decrease ?Outcome: Adequate for Discharge ?  ?Problem: Elimination: ?Goal: Will not experience complications related to bowel motility ?Outcome: Adequate for Discharge ?Goal: Will not experience complications related to urinary retention ?Outcome: Adequate for Discharge ?  ?Problem: Pain Managment: ?Goal: General experience of comfort will improve ?Outcome: Adequate for Discharge ?  ?Problem: Safety: ?Goal: Ability to remain free from injury will improve ?Outcome: Adequate for Discharge ?  ?Problem: Skin Integrity: ?Goal: Risk for impaired skin integrity will decrease ?Outcome: Adequate for Discharge ?  ?

## 2021-11-06 NOTE — Progress Notes (Signed)
[facility] Progress Note ? ?Mario Fisher 88 y.o. Oct 16, 1933 ? ?CC:  Elevated LFTs ? ? ?Subjective: ?Patient states he is feeling good today.  Tolerating diet well.  Would like to go home.  Denies nausea/vomiting.  Denies abdominal pain. ? ?ROS : Review of Systems  ?Constitutional:  Negative for chills and fever.  ?Gastrointestinal:  Negative for abdominal pain, blood in stool, constipation, diarrhea, heartburn, melena, nausea and vomiting.   ? ? ?Objective: ?Vital signs in last 24 hours: ?Vitals:  ? 11/05/21 2005 11/06/21 0326  ?BP: (!) 164/79 135/79  ?Pulse: (!) 59 68  ?Resp: 18 18  ?Temp: (!) 97.4 ?F (36.3 ?C) 98 ?F (36.7 ?C)  ?SpO2: 100% 98%  ? ? ?Physical Exam: ? ?General:  Alert, cooperative, no distress, appears stated age  ?Head:  Normocephalic, without obvious abnormality, atraumatic  ?Eyes:  Anicteric sclera, EOM's intact  ?Lungs:   Clear to auscultation bilaterally, respirations unlabored  ?Heart:  Regular rate and rhythm, S1, S2 normal  ?Abdomen:   Soft, non-tender, bowel sounds active all four quadrants,  no masses,   ? ? ?Lab Results: ?Recent Labs  ?  11/05/21 ?0449 11/06/21 ?0749  ?NA 134* 137  ?K 3.3* 3.8  ?CL 102 103  ?CO2 24 25  ?GLUCOSE 96 107*  ?BUN 17 11  ?CREATININE 1.11 1.05  ?CALCIUM 8.9 9.2  ?MG 1.7 1.6*  ? ?Recent Labs  ?  11/05/21 ?0449 11/06/21 ?0749  ?AST 69* 46*  ?ALT 146* 121*  ?ALKPHOS 214* 243*  ?BILITOT 3.2* 2.2*  ?PROT 6.2* 6.8  ?ALBUMIN 3.1* 3.5  ? ?Recent Labs  ?  11/05/21 ?0449 11/06/21 ?0749  ?WBC 5.6 4.0  ?NEUTROABS 4.2 2.2  ?HGB 10.0* 11.7*  ?HCT 28.5* 35.0*  ?MCV 93.4 95.1  ?PLT 152 200  ? ?No results for input(s): LABPROT, INR in the last 72 hours. ? ? ? ?Assessment ?Elevated LFTs ?-Ultrasound and CAT scan, unremarkable. ?-AST 46/ALT 121/alk phos 243 improving ?-Total bilirubin 2.2, improving ?-Leukocytosis with WBC 13.8 ?-Labs negative for hepatitis A, hepatitis B, and hepatitis C ?-Ceruloplasmin 15.3 ?-alpha 1 antitrypsin unremarkable ?-ASMA, AMA still  pending ?-Iron studies normal ?  ?Bacteremia E. Coli ?  ?Atrial fibrillation on Xarelto ?  ?Sick sinus syndrome, s/p PPM ? ? ?Plan: ?LFTs trending to normalization.  Recommend continue to trend to normalization as an outpatient. ?Continue pantoprazole once a day for the next 2 months for possible duodenitis noted on CAT scan ?Patient can be discharged from GI standpoint. [facility] will sign off. Please contact us if we can be of any further assistance during this hospital stay.  ? ?Garnette Scheuermann PA-C ?11/06/2021, 10:21 AM ? ?Contact #  [phone]  ?

## 2021-11-06 NOTE — Progress Notes (Signed)
?   ? ? ? ? ?[facility] ? ?Date of Admission:  11/02/2021   Total days of inpatient antibiotics 3 ? ?Principal Problem: ?  SIRS (systemic inflammatory response syndrome) (HCC) ?Active Problems: ?  HTN (hypertension) ?  Duodenitis ?  Elevated LFTs ?  Chest pain ?  Rheumatoid arthritis ([place]) ?  Ankylosing spondylitis ([place]) ?  Stage 3a chronic kidney disease (CKD) ([place]) ?  Atherosclerosis ?  BPH (benign prostatic hyperplasia) ?  Normocytic anemia ?  E coli bacteremia ?  Paroxysmal atrial fibrillation (HCC) ?  Hyponatremia ?  Hypokalemia ?     ?    ?Assessment: ?8 YM admitted for duodenitis found to have  Ecoli bacteremia. ?  ?#Ecoli bacteremia 2/2 likely GI etiology ?-CT showed  stranding in the inferior aspect of gallbladder fossa and adjacent to right lobe of liver likely 2/2 duodenitis. US showed no intrahepatic and extrahepatic biliary ductal dilation.  ?-Pt is jaundiced. Reports midabdominal 30 min following meals since January 27th, which is consistent with duodenal ulcer. Concern about possible CBD involvement although imaging was unrevealing.   ?-GI consulted and planned to obtain MRCP if LFTs worsen.  No plans for MRCP as LFTs trended down and imaging showed unremarkable CBD. Outpatient GI follow-up. ?-Acute hepatic panel negative(HBsAg, HCV ab, HAIgM, Hbcore Ab) ?-Will treat with 2 weeks of antibiotics for bacteremia 2/2 GI source.  ?Recommendations:  ?-D/C ceftriaxone  ?-Continue metronidazole ?-Start cefadroxil. ?-Treat with 2 weeks of antibiotics EOT 3/18 ?-Agree with outpatient GI follow-up ?-Follow-up in ID clinic on 11/18/21 ? ? ?Microbiology:   ?Antibiotics: ?Ceftriaxone and metronidazole 3/5-p ?Pip-tazo 3/6 ?  ?Cultures: ?Blood ?3/5 1/2 Ecoli ?Urine ?3/5 pending ? ? ?SUBJECTIVE: ?No new complaints. No significant overnight events.  ? ?Review of Systems: ?Review of Systems  ?All other systems reviewed and are negative. ? ? ?Scheduled Meds: ? allopurinol  100 mg Oral Daily  ?  cefadroxil  1,000 mg Oral BID  ? dorzolamide  1 drop Both Eyes BID  ? feeding supplement  237 mL Oral BID BM  ? irbesartan  75 mg Oral Daily  ? metroNIDAZOLE  500 mg Oral Q12H  ? multivitamin with minerals  1 tablet Oral Daily  ? omega-3 acid ethyl esters  1 g Oral BID  ? pantoprazole  40 mg Oral Daily  ? polyvinyl alcohol  1 drop Both Eyes BID  ? Rivaroxaban  15 mg Oral Q supper  ? tamsulosin  0.4 mg Oral Daily  ? ?Continuous Infusions: ? sodium chloride Stopped (11/06/21 0336)  ? ?PRN Meds:.sodium chloride, acetaminophen **OR** acetaminophen, HYDROcodone-acetaminophen, ondansetron **OR** ondansetron (ZOFRAN) IV, polyethylene glycol ?Allergies  ?Allergen Reactions  ? Statins Other (See Comments)  ?  Joint pain  ? Cholesterol Rash  ? ? ?OBJECTIVE: ?Vitals:  ? 11/05/21 2005 11/06/21 [id number] 11/06/21 0328 11/06/21 1207  ?BP: (!) 164/79 135/79  107/61  ?Pulse: (!) 59 68  68  ?Resp: 18 18  20   ?Temp: (!) 97.4 ?F (36.3 ?C) 98 ?F (36.7 ?C)  (!) 97.5 ?F (36.4 ?C)  ?TempSrc:    Oral  ?SpO2: 100% 98%  99%  ?Weight:   73.1 kg   ?Height:      ? ?Body mass index is 26.01 kg/m?. ? ?Physical Exam ?Constitutional:   ?   General: He is not in acute distress. ?   Appearance: He is normal weight. He is not toxic-appearing.  ?HENT:  ?   Head: Normocephalic and atraumatic.  ?  Comments: Scleral icterus ?   Right Ear: External ear normal.  ?   Left Ear: External ear normal.  ?   Nose: No congestion or rhinorrhea.  ?   Mouth/Throat:  ?   Mouth: Mucous membranes are moist.  ?   Pharynx: Oropharynx is clear.  ?Eyes:  ?   Extraocular Movements: Extraocular movements intact.  ?   Conjunctiva/sclera: Conjunctivae normal.  ?   Pupils: Pupils are equal, round, and reactive to light.  ?Cardiovascular:  ?   Heart sounds: No murmur heard. ?  No friction rub. No gallop.  ?Pulmonary:  ?   Effort: Pulmonary effort is normal.  ?Abdominal:  ?   General: Abdomen is flat.  ?Musculoskeletal:     ?   General: No swelling. Normal range of motion.  ?    Cervical back: Normal range of motion.  ?Skin: ?   General: Skin is dry.  ?   Comments: Jaundiced  ?Neurological:  ?   General: No focal deficit present.  ?   Mental Status: He is oriented to person, place, and time.  ?Psychiatric:     ?   Mood and Affect: Mood normal.  ? ? ? ? ?Lab Results ?Lab Results  ?Component Value Date  ? WBC 4.0 11/06/2021  ? HGB 11.7 (L) 11/06/2021  ? HCT 35.0 (L) 11/06/2021  ? MCV 95.1 11/06/2021  ? PLT 200 11/06/2021  ?  ?Lab Results  ?Component Value Date  ? CREATININE 1.05 11/06/2021  ? BUN 11 11/06/2021  ? NA 137 11/06/2021  ? K 3.8 11/06/2021  ? CL 103 11/06/2021  ? CO2 25 11/06/2021  ?  ?Lab Results  ?Component Value Date  ? ALT 121 (H) 11/06/2021  ? AST 46 (H) 11/06/2021  ? ALKPHOS 243 (H) 11/06/2021  ? BILITOT 2.2 (H) 11/06/2021  ?  ? ? ? ? ?Laurice Record, MD ?[facility] ?[facility] ?11/06/2021, 3:10 PM  ?

## 2021-11-06 NOTE — Discharge Summary (Signed)
Physician Discharge Summary   Patient: Mario Fisher MRN: [id number] DOB: 01-29-1934  Admit date:     11/02/2021  Discharge date: 11/06/21  Discharge Physician: Aline August   PCP: Kristen Loader, FNP   Recommendations at discharge:   Follow-up with PCP within a week with repeat CBC/CMP Outpatient follow-up with GI Follow-up in the ED if symptoms worsen or new appear   Hospital Course: 88 y.o. male with medical history significant for paroxysmal Afib, SSS s/p PPM, HTN, HLD, ankylosing spondylosis, RA, CKD3a presented with on and off fevers and chills for the last couple of months along with abdominal pain after eating.  On presentation, sodium 131, creatinine 1.3, AST 416, ALT 337, total bili of 3.8, WBC 13.8.  CT of abdomen/pelvis with contrast showed findings secondary to proximal duodenitis without perforation along with severe atherosclerosis in proximal portion of superior mesenteric artery with subsequent hemodynamically significant stenosis.  Patient was started on IV antibiotics.  GI was consulted.  He was found to have E. coli bacteremia: ID was consulted.  During the hospitalization, his condition has improved.  He is hemodynamically stable, not spiking any more temperatures.  ID has switched him to oral cefadroxil and Flagyl; GI has cleared him for discharge as well.  LFTs have much improved.  He will be discharged home today with close outpatient follow-up with PCP.  Will need outpatient follow-up with GI as well.  Assessment and Plan: E coli bacteremia -UA negative for UTI.  LFTs are elevated but improving and there are no signs of cholangitis. -2D echo showed EF of 50 to 55% with indeterminate left ventricular diastolic parameters with no evidence of vegetation -Afebrile since admission. -Treated with Rocephin and Flagyl.  ID following and has switched him to oral cefadroxil and Flagyl till 11/18/2021 and cleared him for discharge. -Discharge patient home today    Duodenitis - CT imaging showed possible duodenitis. -GI recommends to continue Protonix once a day for 2 months and outpatient follow-up with GI.  Currently tolerating diet.     Elevated LFTs - Questionable cause.  No biliary dilatation on imaging.  Hepatitis profile negative.   -GI evaluation and follow-up appreciated: LFTs are improving.  Outpatient follow-up of LFTs.  GI has signed off today.    Paroxysmal atrial fibrillation (HCC) - Currently rate controlled.  Continue Xarelto.  Outpatient follow-up with cardiology   Hypokalemia - Improved  Hypomagnesemia -Replace prior to discharge   Hyp improvedonatremia - Mild.  Resolved  Atherosclerosis Atherosclerosis of mesenteric arteries -As noted on CT abdomen/pelvis -Admitting provider discussed with Dr. Aundra Dubin surgery who recommended to treat duodenitis, check mesenteric duplex and outpatient follow-up with vascular surgery. -Mesenteric duplex ultrasound showed 70 to 99% stenosis in the superior mesenteric artery. -Outpatient follow-up with vascular surgery   Stage 3a chronic kidney disease (CKD) (HCC) - Creatinine stable.  Monitor   Rheumatoid arthritis ([place]) Ankylosing spondylitis -Continue home regimen on discharge.  Outpatient follow-up.   Normocytic anemia - Hemoglobin stable.  Outpatient follow-up.    HTN (hypertension) - Blood pressure stable.  Continue current home regimen.  Patient apparently does not take hydrochlorothiazide at home.  This has been discontinued.   Chest pain - Resolved.  Troponins flat.  Nonischemic EKG. -Outpatient follow-up with cardiology.   BPH (benign prostatic hyperplasia) - Continue Flomax.  Consultants: ID/GI Procedures performed: Echo Disposition: Home Diet recommendation:  Discharge Diet Orders (From admission, onward)     Start     Ordered   11/06/21 0000  Diet -  low sodium heart healthy        11/06/21 1051           Cardiac diet DISCHARGE  MEDICATION: Allergies as of 11/06/2021       Reactions   Statins Other (See Comments)   Joint pain   Cholesterol Rash        Medication List     STOP taking these medications    ACETAMINOPHEN PO   hydrochlorothiazide 12.5 MG tablet Commonly known as: HYDRODIURIL       TAKE these medications    allopurinol 100 MG tablet Commonly known as: ZYLOPRIM Take 100 mg by mouth daily.   cefadroxil 500 MG capsule Commonly known as: DURICEF Take 2 capsules by mouth 2  times daily for 11 days. Stop date 11/17/21   cyanocobalamin 1000 MCG/ML injection Commonly known as: (VITAMIN B-12) Inject 1,000 mcg into the muscle every 30 (thirty) days.   dorzolamide 2 % ophthalmic solution Commonly known as: TRUSOPT Place 1 drop into both eyes 2 (two) times daily.   Fish Oil 1200 MG Caps Take 1,200-2,400 mg by mouth See admin instructions. Take 2400 mg by mouth in the morning and take 1200 mg by mouth at bedtime   GLUCOSAMINE CHONDR 1500 COMPLX PO Take 1 tablet by mouth daily.   metroNIDAZOLE 500 MG tablet Commonly known as: FLAGYL Take 1 tablet by mouth every 12  hours for 11 days. Stop date 11/17/21   MULTIVITAMIN ADULTS 50+ PO Take 1 tablet by mouth daily.   pantoprazole 40 MG tablet Commonly known as: PROTONIX Take 1 tablet (40 mg total) by mouth daily. Start taking on: November 07, 2021   Propylene Glycol 0.6 % Soln Place 1 drop into both eyes 2 (two) times daily.   Rinvoq 15 MG Tb24 Generic drug: Upadacitinib ER Take 15 mg by mouth daily.   Rivaroxaban 15 MG Tabs tablet Commonly known as: Xarelto Take 1 tablet (15 mg total) by mouth daily with supper.   tamsulosin 0.4 MG Caps capsule Commonly known as: FLOMAX Take 0.4 mg by mouth daily.   valsartan 80 MG tablet Commonly known as: DIOVAN Take 40-80 mg by mouth daily. Patient checks BP and if its low will only take 1/2 tab = 40mg          Subjective: Patient seen and examined at bedside.  Feels much better and wants  to go home today.  Denies any overnight fever, vomiting, worsening abdominal pain. Discharge Exam: Filed Weights   11/03/21 0853 11/05/21 0433 11/06/21 0328  Weight: 74.3 kg 70 kg 73.1 kg   General: On room air.  No distress.  Slightly hard of hearing. respiratory: Decreased breath sounds at bases bilaterally CVS: Currently rate controlled; S1-S2 heard  abdominal: Soft, nontender, slightly distended, no organomegaly, normal bowel sounds are heard  extremities: No cyanosis, clubbing; trace lower extremity edema   Condition at discharge: fair  The results of significant diagnostics from this hospitalization (including imaging, microbiology, ancillary and laboratory) are listed below for reference.   Imaging Studies: CT Abdomen Pelvis W Contrast  Result Date: 11/03/2021 CLINICAL DATA:  Chest pain and chills with back pain, low-grade fever and vomiting. EXAM: CT ABDOMEN AND PELVIS WITH CONTRAST TECHNIQUE: Multidetector CT imaging of the abdomen and pelvis was performed using the standard protocol following bolus administration of intravenous contrast. RADIATION DOSE REDUCTION: This exam was performed according to the departmental dose-optimization program which includes automated exposure control, adjustment of the mA and/or kV according to patient size and/or  use of iterative reconstruction technique. CONTRAST:  172mL OMNIPAQUE IOHEXOL 300 MG/ML  SOLN COMPARISON:  None. FINDINGS: Lower chest: Mild areas of atelectasis and/or early infiltrate are seen within the posterior aspect of the left lung base and anteromedial aspect of the right lower lobe. Hepatobiliary: No focal liver abnormality is seen. Status post cholecystectomy, with a mild amount of fluid and inflammatory stranding seen along the inferior aspect of the gallbladder fossa. This is seen in between the medial aspect of the right lobe of the liver and first part of the proximal duodenum (axial CT images 26 through 29, CT series 2). Mild  central intrahepatic biliary dilatation is noted. Pancreas: Unremarkable. No pancreatic ductal dilatation or surrounding inflammatory changes. Spleen: Normal in size without focal abnormality. Adrenals/Urinary Tract: Adrenal glands are unremarkable. Kidneys are normal in size, without renal calculi or hydronephrosis. A 7 mm cystic appearing focus is seen within the lower pole of the right kidney. The urinary bladder is partially contracted. Mild diffuse urinary bladder wall thickening is seen. Stomach/Bowel: Stomach is within normal limits. Appendix appears normal. No evidence of bowel wall thickening, distention, or inflammatory changes. Noninflamed diverticula are seen throughout the sigmoid colon Vascular/Lymphatic: Aortic atherosclerosis. Marked severity atherosclerotic changes are seen along the origin and proximal portion of the superior mesenteric artery (approximately 15 mm in length) with subsequent hemodynamically significant stenosis (best seen on coronal reformatted images 46 through 51, CT series 5/sagittal reformatted image 75, CT series 6). 18 mm x 10 mm x 25 mm and 25 mm x 10 mm x 16 mm central superior mesenteric lymph nodes are seen (axial CT images 21 through 25, CT series 2). Reproductive: There is moderate to marked severity prostate gland enlargement. Mass effect is seen on the adjacent portion of the urinary bladder. Other: No abdominal wall hernia or abnormality. No abdominopelvic ascites. Musculoskeletal: There is marked severity dextroscoliosis of the mid to lower lumbar spine with marked severity multilevel degenerative changes. IMPRESSION: 1. Status post cholecystectomy, with fluid and inflammatory stranding seen along the inferior aspect of the gallbladder fossa and adjacent portion of the right lobe of the liver, as described above. This is likely secondary to proximal duodenitis without evidence of perforation. 2. Marked severity atherosclerotic changes along the origin and proximal  portion of the superior mesenteric artery with subsequent hemodynamically significant stenosis. Further evaluation with conventional angiography is recommended. 3. Sigmoid diverticulosis. 4. Moderate to marked severity prostatomegaly. Correlation with PSA values is recommended. 5. Mild posterior left basilar and anteromedial right lower lobe atelectasis and/or early infiltrate. 6. Marked severity dextroscoliosis of the mid to lower lumbar spine with marked severity multilevel degenerative changes. Aortic Atherosclerosis (ICD10-I70.0). Electronically Signed   By: Virgina Norfolk M.D.   On: 11/03/2021 01:44   DG Chest Port 1 View  Result Date: 11/02/2021 CLINICAL DATA:  Fever. EXAM: PORTABLE CHEST 1 VIEW COMPARISON:  Chest CT 09/03/2017 FINDINGS: Left-sided pacemaker in place. Mild cardiomegaly. Normal mediastinal contours. No confluent consolidation or evidence of pneumonia. No pulmonary edema no pleural fluid or pneumothorax. IMPRESSION: 1. Mild cardiomegaly. No evidence of pneumonia or explanation for fever. 2. Left-sided pacemaker in place. Electronically Signed   By: Keith Rake M.D.   On: 11/02/2021 23:43   VAS [place] MESENTERIC  Result Date: 11/04/2021 ABDOMINAL VISCERAL Patient Name:  Mario Fisher  Date of Exam:   11/04/2021 Medical Rec #: [id number]        Accession #:    [id number] Date of Birth: [birth year]  Patient Gender: M Patient Age:   34 years Exam Location:  [facility] Procedure:      VAS [place] MESENTERIC Referring Phys: [id number] [place] -------------------------------------------------------------------------------- Limitations: Air/bowel gas. Comparison Study: No previous exams Performing Technologist: Jody Hill RVT, RDMS  Examination Guidelines: A complete evaluation includes B-mode imaging, spectral Doppler, color Doppler, and power Doppler as needed of all accessible portions of each vessel. Bilateral testing is considered an integral part of a complete examination.  Limited examinations for reoccurring indications may be performed as noted.  Duplex Findings: +----------------------+--------+--------+------+--------------+  Mesenteric             PSV cm/s EDV cm/s Plaque    Comments     +----------------------+--------+--------+------+--------------+  Aorta at Celiac           62       11                           +----------------------+--------+--------+------+--------------+  Aorta Prox                48       7                            +----------------------+--------+--------+------+--------------+  Aorta Distal              86       0                            +----------------------+--------+--------+------+--------------+  Celiac Artery Origin     177                                    +----------------------+--------+--------+------+--------------+  Celiac Artery Proximal   147       34                           +----------------------+--------+--------+------+--------------+  SMA Origin               140       25                           +----------------------+--------+--------+------+--------------+  SMA Proximal             303       34                           +----------------------+--------+--------+------+--------------+  SMA Mid                  152       12                           +----------------------+--------+--------+------+--------------+  SMA Distal                43       0                            +----------------------+--------+--------+------+--------------+  CHA                      123  31                           +----------------------+--------+--------+------+--------------+  Splenic                                         not visualized  +----------------------+--------+--------+------+--------------+  IMA                      143       0                            +----------------------+--------+--------+------+--------------+    Summary: Mesenteric: Normal Celiac artery , Inferior Mesenteric artery and Hepatic artery  findings. 70 to 99% stenosis in the superior mesenteric artery. Splenic artey not seen on this exam.  *See table(s) above for measurements and observations.  Diagnosing physician: Servando Snare MD  Electronically signed by Servando Snare MD on 11/04/2021 at 5:13:20 PM.    Final    ECHOCARDIOGRAM COMPLETE BUBBLE STUDY  Result Date: 11/04/2021    ECHOCARDIOGRAM REPORT   Patient Name:   Mario Fisher Date of Exam: 11/04/2021 Medical Rec #:  [id number]       Height:       66.0 in Accession #:    [id number]      Weight:       163.8 lb Date of Birth:  03-18-1934       BSA:          1.837 m Patient Age:    30 years        BP:           169/90 mmHg Patient Gender: M               HR:           60 bpm. Exam Location:  Inpatient Procedure: 2D Echo, Cardiac Doppler, Color Doppler and bubble study Indications:    BACTEREMIA  History:        Patient has prior history of Echocardiogram examinations, most                 recent 10/12/2018. Pacemaker, Stroke, Arrythmias:Atrial                 Fibrillation; Risk Factors:Hypertension and Dyslipidemia.  Sonographer:    Beryle Beams Referring Phys: [id number] [place]  1. Left ventricular ejection fraction, by estimation, is 50 to 55%. The left ventricle has low normal function. The left ventricle has no regional wall motion abnormalities. Left ventricular diastolic parameters are indeterminate.  2. Right ventricular systolic function is normal. The right ventricular size is normal. There is mildly elevated pulmonary artery systolic pressure. The estimated right ventricular systolic pressure is 123456 mmHg.  3. Left atrial size was moderately dilated.  4. Right atrial size was mildly dilated.  5. The mitral valve is normal in structure. Trivial mitral valve regurgitation. No evidence of mitral stenosis.  6. The aortic valve is tricuspid. There is mild calcification of the aortic valve. Aortic valve regurgitation is not visualized. Aortic valve sclerosis/calcification is  present, without any evidence of aortic stenosis.  7. The inferior vena cava is dilated in size with <50% respiratory variability, suggesting right atrial pressure of 15 mmHg.  8. Negative bubble study, no PFO or ASD.  9.  The patient was in atrial fibrillation. FINDINGS  Left Ventricle: Left ventricular ejection fraction, by estimation, is 50 to 55%. The left ventricle has low normal function. The left ventricle has no regional wall motion abnormalities. The left ventricular internal cavity size was normal in size. There is no left ventricular hypertrophy. Left ventricular diastolic parameters are indeterminate. Right Ventricle: The right ventricular size is normal. No increase in right ventricular wall thickness. Right ventricular systolic function is normal. There is mildly elevated pulmonary artery systolic pressure. The tricuspid regurgitant velocity is 2.63  m/s, and with an assumed right atrial pressure of 15 mmHg, the estimated right ventricular systolic pressure is 123456 mmHg. Left Atrium: Left atrial size was moderately dilated. Right Atrium: Right atrial size was mildly dilated. Pericardium: There is no evidence of pericardial effusion. Mitral Valve: The mitral valve is normal in structure. There is mild calcification of the mitral valve leaflet(s). Mild mitral annular calcification. Trivial mitral valve regurgitation. No evidence of mitral valve stenosis. Tricuspid Valve: The tricuspid valve is normal in structure. Tricuspid valve regurgitation is trivial. Aortic Valve: The aortic valve is tricuspid. There is mild calcification of the aortic valve. Aortic valve regurgitation is not visualized. Aortic valve sclerosis/calcification is present, without any evidence of aortic stenosis. Aortic valve mean gradient measures 6.0 mmHg. Aortic valve peak gradient measures 8.1 mmHg. Aortic valve area, by VTI measures 1.28 cm. Pulmonic Valve: The pulmonic valve was normal in structure. Pulmonic valve regurgitation is  trivial. Aorta: The aortic root is normal in size and structure. Venous: The inferior vena cava is dilated in size with less than 50% respiratory variability, suggesting right atrial pressure of 15 mmHg. IAS/Shunts: Negative bubble study, no PFO or ASD. Additional Comments: A device lead is visualized in the right ventricle.  LEFT VENTRICLE PLAX 2D LVIDd:         4.30 cm     Diastology LVIDs:         2.70 cm     LV e' medial:    8.05 cm/s LV PW:         1.10 cm     LV E/e' medial:  11.0 LV IVS:        0.80 cm     LV e' lateral:   9.57 cm/s LVOT diam:     1.60 cm     LV E/e' lateral: 9.3 LV SV:         49 LV SV Index:   26 LVOT Area:     2.01 cm  LV Volumes (MOD) LV vol d, MOD A4C: 69.0 ml LV vol s, MOD A2C: 23.0 ml LV vol s, MOD A4C: 33.0 ml LV SV MOD A4C:     69.0 ml RIGHT VENTRICLE             IVC RV S prime:     13.50 cm/s  IVC diam: 1.80 cm TAPSE (M-mode): 2.3 cm LEFT ATRIUM             Index        RIGHT ATRIUM           Index LA diam:        4.90 cm 2.67 cm/m   RA Area:     19.80 cm LA Vol (A2C):   96.2 ml 52.37 ml/m  RA Volume:   58.50 ml  31.84 ml/m LA Vol (A4C):   59.1 ml 32.17 ml/m LA Biplane Vol: 79.9 ml 43.49 ml/m  AORTIC VALVE  PULMONIC VALVE AV Area (Vmax):    1.30 cm      PV Vmax:       0.56 m/s AV Area (Vmean):   1.10 cm      PV Vmean:      36.200 cm/s AV Area (VTI):     1.28 cm      PV VTI:        0.112 m AV Vmax:           142.00 cm/s   PV Peak grad:  1.2 mmHg AV Vmean:          113.000 cm/s  PV Mean grad:  1.0 mmHg AV VTI:            0.380 m AV Peak Grad:      8.1 mmHg AV Mean Grad:      6.0 mmHg LVOT Vmax:         91.70 cm/s LVOT Vmean:        62.000 cm/s LVOT VTI:          0.242 m LVOT/AV VTI ratio: 0.64  AORTA Ao Root diam: 3.00 cm Ao Asc diam:  3.00 cm MITRAL VALVE               TRICUSPID VALVE MV Area (PHT): 3.27 cm    TR Peak grad:   27.7 mmHg MV Decel Time: 232 msec    TR Mean grad:   21.0 mmHg MR Peak grad: 74.3 mmHg    TR Vmax:        263.00 cm/s MR Mean  grad: 51.0 mmHg    TR Vmean:       220.0 cm/s MR Vmax:      431.00 cm/s MR Vmean:     343.0 cm/s   SHUNTS MV E velocity: 88.70 cm/s  Systemic VTI:  0.24 m MV A velocity: 36.80 cm/s  Systemic Diam: 1.60 cm MV E/A ratio:  2.41 Dalton McleanMD Electronically signed by Franki Monte Signature Date/Time: 11/04/2021/8:10:19 PM    Final    US Abdomen Limited RUQ (LIVER/GB)  Result Date: 11/03/2021 CLINICAL DATA:  Elevated LFTs. EXAM: ULTRASOUND ABDOMEN LIMITED RIGHT UPPER QUADRANT COMPARISON:  11/03/2021 CT abdomen FINDINGS: Gallbladder: Surgically absent Common bile duct: Diameter: 4.3 mm Liver: No focal lesion identified. Within normal limits in parenchymal echogenicity. Portal vein is patent on color Doppler imaging with normal direction of blood flow towards the liver. Other: None. IMPRESSION: 1. Prior cholecystectomy. 2. No intrahepatic or extrahepatic biliary ductal dilatation. Electronically Signed   By: Kathreen Devoid M.D.   On: 11/03/2021 14:28    Microbiology: Results for orders placed or performed during the hospital encounter of 11/02/21  Resp Panel by RT-PCR (Flu A&B, Covid) Nasopharyngeal Swab     Status: None   Collection Time: 11/02/21 11:39 PM   Specimen: Nasopharyngeal Swab; Nasopharyngeal(NP) swabs in vial transport medium  Result Value Ref Range Status   SARS Coronavirus 2 by RT PCR NEGATIVE NEGATIVE Final    Comment: (NOTE) SARS-CoV-2 target nucleic acids are NOT DETECTED.  The SARS-CoV-2 RNA is generally detectable in upper respiratory specimens during the acute phase of infection. The lowest concentration of SARS-CoV-2 viral copies this assay can detect is 138 copies/mL. A negative result does not preclude SARS-Cov-2 infection and should not be used as the sole basis for treatment or other patient management decisions. A negative result may occur with  improper specimen collection/handling, submission of specimen other than nasopharyngeal swab, presence of viral mutation(s) within  the areas targeted by this assay, and inadequate number of viral copies(<138 copies/mL). A negative result must be combined with clinical observations, patient history, and epidemiological information. The expected result is Negative.  Fact Sheet for Patients:  EntrepreneurPulse.com.au  Fact Sheet for Healthcare Providers:  IncredibleEmployment.be  This test is no t yet approved or cleared by the [place] FDA and  has been authorized for detection and/or diagnosis of SARS-CoV-2 by FDA under an Emergency Use Authorization (EUA). This EUA will remain  in effect (meaning this test can be used) for the duration of the COVID-19 declaration under Section 564(b)(1) of the Act, 21 U.S.C.section 360bbb-3(b)(1), unless the authorization is terminated  or revoked sooner.       Influenza A by PCR NEGATIVE NEGATIVE Final   Influenza B by PCR NEGATIVE NEGATIVE Final    Comment: (NOTE) The Xpert Xpress SARS-CoV-2/FLU/RSV plus assay is intended as an aid in the diagnosis of influenza from Nasopharyngeal swab specimens and should not be used as a sole basis for treatment. Nasal washings and aspirates are unacceptable for Xpert Xpress SARS-CoV-2/FLU/RSV testing.  Fact Sheet for Patients: EntrepreneurPulse.com.au  Fact Sheet for Healthcare Providers: IncredibleEmployment.be  This test is not yet approved or cleared by the [place] FDA and has been authorized for detection and/or diagnosis of SARS-CoV-2 by FDA under an Emergency Use Authorization (EUA). This EUA will remain in effect (meaning this test can be used) for the duration of the COVID-19 declaration under Section 564(b)(1) of the Act, 21 U.S.C. section 360bbb-3(b)(1), unless the authorization is terminated or revoked.  Performed at [facility], [address] [place], [place] [address]   Culture, blood (routine x 2)     Status: None  (Preliminary result)   Collection Time: 11/02/21 11:41 PM   Specimen: Left Antecubital; Blood  Result Value Ref Range Status   Specimen Description   Final    LEFT ANTECUBITAL Performed at [facility], [address] [place], [place] [address]    Special Requests   Final    BOTTLES DRAWN AEROBIC AND ANAEROBIC Blood Culture adequate volume Performed at [facility], [address] [place], [place] [address]    Culture   Final    NO GROWTH 3 DAYS Performed at [facility], [place] [address], [place], [place] [address]    Report Status PENDING  Incomplete  Culture, blood (routine x 2)     Status: Abnormal   Collection Time: 11/02/21 11:42 PM   Specimen: BLOOD RIGHT FOREARM  Result Value Ref Range Status   Specimen Description   Final    BLOOD RIGHT FOREARM Performed at [facility], [address] [place], [place] [address]    Special Requests   Final    BOTTLES DRAWN AEROBIC AND ANAEROBIC Blood Culture adequate volume Performed at [facility], [address] [place], [place] [address]    Culture  Setup Time   Final    GRAM NEGATIVE RODS IN BOTH AEROBIC AND ANAEROBIC BOTTLES CRITICAL RESULT CALLED TO, READ BACK BY AND VERIFIED WITH: M LILLISTON,PHARM@[id number]  11/04/21 [place] Performed at [facility], [place] [address] [place], [place] [address]    Culture ESCHERICHIA COLI (A)  Final   Report Status 11/06/2021 FINAL  Final   Organism ID, Bacteria ESCHERICHIA COLI  Final      Susceptibility   Escherichia coli - MIC*    AMPICILLIN <=2 SENSITIVE Sensitive  CEFAZOLIN <=4 SENSITIVE Sensitive     CEFEPIME <=0.12 SENSITIVE Sensitive     CEFTAZIDIME <=1 SENSITIVE Sensitive     CEFTRIAXONE <=0.25 SENSITIVE Sensitive     CIPROFLOXACIN <=0.25 SENSITIVE Sensitive     GENTAMICIN <=1 SENSITIVE Sensitive     IMIPENEM <=0.25 SENSITIVE Sensitive     TRIMETH/SULFA <=20 SENSITIVE Sensitive     AMPICILLIN/SULBACTAM <=2 SENSITIVE Sensitive      PIP/TAZO <=4 SENSITIVE Sensitive     * ESCHERICHIA COLI  Blood Culture ID Panel (Reflexed)     Status: Abnormal   Collection Time: 11/02/21 11:42 PM  Result Value Ref Range Status   Enterococcus faecalis NOT DETECTED NOT DETECTED Final   Enterococcus Faecium NOT DETECTED NOT DETECTED Final   Listeria monocytogenes NOT DETECTED NOT DETECTED Final   Staphylococcus species NOT DETECTED NOT DETECTED Final   Staphylococcus aureus (BCID) NOT DETECTED NOT DETECTED Final   Staphylococcus epidermidis NOT DETECTED NOT DETECTED Final   Staphylococcus lugdunensis NOT DETECTED NOT DETECTED Final   Streptococcus species NOT DETECTED NOT DETECTED Final   Streptococcus agalactiae NOT DETECTED NOT DETECTED Final   Streptococcus pneumoniae NOT DETECTED NOT DETECTED Final   Streptococcus pyogenes NOT DETECTED NOT DETECTED Final   A.calcoaceticus-baumannii NOT DETECTED NOT DETECTED Final   Bacteroides fragilis NOT DETECTED NOT DETECTED Final   Enterobacterales DETECTED (A) NOT DETECTED Final    Comment: Enterobacterales represent a large order of gram negative bacteria, not a single organism. CRITICAL RESULT CALLED TO, READ BACK BY AND VERIFIED WITH: M LILLISTON,PHARMD@0157  11/04/21 [place]    Enterobacter cloacae complex NOT DETECTED NOT DETECTED Final   Escherichia coli DETECTED (A) NOT DETECTED Final    Comment: CRITICAL RESULT CALLED TO, READ BACK BY AND VERIFIED WITH: M LILLISTON,PHARMD@0157  11/04/21 [place]    Klebsiella aerogenes NOT DETECTED NOT DETECTED Final   Klebsiella oxytoca NOT DETECTED NOT DETECTED Final   Klebsiella pneumoniae NOT DETECTED NOT DETECTED Final   Proteus species NOT DETECTED NOT DETECTED Final   Salmonella species NOT DETECTED NOT DETECTED Final   Serratia marcescens NOT DETECTED NOT DETECTED Final   Haemophilus influenzae NOT DETECTED NOT DETECTED Final   Neisseria meningitidis NOT DETECTED NOT DETECTED Final   Pseudomonas aeruginosa NOT DETECTED NOT DETECTED Final    Stenotrophomonas maltophilia NOT DETECTED NOT DETECTED Final   Candida albicans NOT DETECTED NOT DETECTED Final   Candida auris NOT DETECTED NOT DETECTED Final   Candida glabrata NOT DETECTED NOT DETECTED Final   Candida krusei NOT DETECTED NOT DETECTED Final   Candida parapsilosis NOT DETECTED NOT DETECTED Final   Candida tropicalis NOT DETECTED NOT DETECTED Final   Cryptococcus neoformans/gattii NOT DETECTED NOT DETECTED Final   CTX-M ESBL NOT DETECTED NOT DETECTED Final   Carbapenem resistance IMP NOT DETECTED NOT DETECTED Final   Carbapenem resistance KPC NOT DETECTED NOT DETECTED Final   Carbapenem resistance NDM NOT DETECTED NOT DETECTED Final   Carbapenem resist OXA 48 LIKE NOT DETECTED NOT DETECTED Final   Carbapenem resistance VIM NOT DETECTED NOT DETECTED Final    Comment: Performed at [facility], [address] [place], [place] [address]  Urine Culture     Status: None   Collection Time: 11/03/21 12:25 AM   Specimen: Urine, Clean Catch  Result Value Ref Range Status   Specimen Description   Final    URINE, CLEAN CATCH Performed at [facility], [address] [place], [place] [address]    Special Requests   Final  NONE Performed at [facility], [address] [place], [place] [address]    Culture   Final    NO GROWTH Performed at [facility], [place] [address], [place], [place] [address]    Report Status 11/04/2021 FINAL  Final    Labs: CBC: Recent Labs  Lab 11/02/21 2339 11/04/21 0422 11/05/21 0449 11/06/21 0749  WBC 13.8* 9.2 5.6 4.0  NEUTROABS 12.4*  --  4.2 2.2  HGB 11.6* 9.9* 10.0* 11.7*  HCT 34.1* 29.8* 28.5* 35.0*  MCV 94.5 95.2 93.4 95.1  PLT 175 143* 152 [id number]   Basic Metabolic Panel: Recent Labs  Lab 11/02/21 2339 11/04/21 0422 11/05/21 0449 11/06/21 0749  NA 131* 134* 134* 137  K 3.9 3.9 3.3* 3.8  CL 100 104 102 103  CO2 21* 22 24 25   GLUCOSE 141* 86 96 107*  BUN 21 24* 17 11  CREATININE 1.30*  1.25* 1.11 1.05  CALCIUM 9.3 9.0 8.9 9.2  MG  --   --  1.7 1.6*   Liver Function Tests: Recent Labs  Lab 11/02/21 2339 11/03/21 0939 11/04/21 0422 11/05/21 0449 11/06/21 0749  AST 416*  --  134* 69* 46*  ALT 337*  --  206* 146* 121*  ALKPHOS 303*  --  233* 214* 243*  BILITOT 3.8* 5.3* 5.6* 3.2* 2.2*  PROT 6.8  --  6.2* 6.2* 6.8  ALBUMIN 3.7  --  3.3* 3.1* 3.5   CBG: No results for input(s): GLUCAP in the last 168 hours.  Discharge time spent: greater than 30 minutes.  Signed: Aline August, MD [facility] 11/06/2021

## 2021-11-08 DIAGNOSIS — D638 Anemia in other chronic diseases classified elsewhere: Secondary | ICD-10-CM | POA: Diagnosis not present

## 2021-11-08 DIAGNOSIS — Z09 Encounter for follow-up examination after completed treatment for conditions other than malignant neoplasm: Secondary | ICD-10-CM | POA: Diagnosis not present

## 2021-11-08 DIAGNOSIS — E871 Hypo-osmolality and hyponatremia: Secondary | ICD-10-CM | POA: Diagnosis not present

## 2021-11-08 DIAGNOSIS — K2980 Duodenitis without bleeding: Secondary | ICD-10-CM | POA: Diagnosis not present

## 2021-11-08 DIAGNOSIS — R7989 Other specified abnormal findings of blood chemistry: Secondary | ICD-10-CM | POA: Diagnosis not present

## 2021-11-08 DIAGNOSIS — I480 Paroxysmal atrial fibrillation: Secondary | ICD-10-CM | POA: Diagnosis not present

## 2021-11-08 DIAGNOSIS — N1832 Chronic kidney disease, stage 3b: Secondary | ICD-10-CM | POA: Diagnosis not present

## 2021-11-08 DIAGNOSIS — R7881 Bacteremia: Secondary | ICD-10-CM | POA: Diagnosis not present

## 2021-11-08 DIAGNOSIS — E876 Hypokalemia: Secondary | ICD-10-CM | POA: Diagnosis not present

## 2021-11-08 DIAGNOSIS — D6869 Other thrombophilia: Secondary | ICD-10-CM | POA: Diagnosis not present

## 2021-11-08 LAB — CULTURE, BLOOD (ROUTINE X 2)
Culture: NO GROWTH
Special Requests: ADEQUATE

## 2021-11-18 ENCOUNTER — Other Ambulatory Visit: Payer: Self-pay

## 2021-11-18 ENCOUNTER — Ambulatory Visit: Payer: Medicare Other | Admitting: Internal Medicine

## 2021-11-18 ENCOUNTER — Encounter: Payer: Self-pay | Admitting: Internal Medicine

## 2021-11-18 ENCOUNTER — Ambulatory Visit (INDEPENDENT_AMBULATORY_CARE_PROVIDER_SITE_OTHER): Payer: Medicare Other

## 2021-11-18 VITALS — BP 154/85 | HR 73 | Temp 97.5°F | Wt 166.0 lb

## 2021-11-18 DIAGNOSIS — B9620 Unspecified Escherichia coli [E. coli] as the cause of diseases classified elsewhere: Secondary | ICD-10-CM | POA: Diagnosis not present

## 2021-11-18 DIAGNOSIS — R7881 Bacteremia: Secondary | ICD-10-CM | POA: Diagnosis not present

## 2021-11-18 DIAGNOSIS — I495 Sick sinus syndrome: Secondary | ICD-10-CM | POA: Diagnosis not present

## 2021-11-18 LAB — CUP PACEART REMOTE DEVICE CHECK
Battery Remaining Longevity: 80 mo
Battery Voltage: 2.97 V
Brady Statistic AP VP Percent: 22.02 %
Brady Statistic AP VS Percent: 34.92 %
Brady Statistic AS VP Percent: 12.43 %
Brady Statistic AS VS Percent: 30.63 %
Brady Statistic RA Percent Paced: 56.33 %
Brady Statistic RV Percent Paced: 34.45 %
Date Time Interrogation Session: 20230320014346
Implantable Lead Implant Date: 20180808
Implantable Lead Implant Date: 20180808
Implantable Lead Location: 753859
Implantable Lead Location: 753860
Implantable Lead Model: 5076
Implantable Lead Model: 5076
Implantable Pulse Generator Implant Date: 20180808
Lead Channel Impedance Value: 266 Ohm
Lead Channel Impedance Value: 323 Ohm
Lead Channel Impedance Value: 342 Ohm
Lead Channel Impedance Value: 456 Ohm
Lead Channel Pacing Threshold Amplitude: 0.875 V
Lead Channel Pacing Threshold Amplitude: 1 V
Lead Channel Pacing Threshold Pulse Width: 0.4 ms
Lead Channel Pacing Threshold Pulse Width: 0.4 ms
Lead Channel Sensing Intrinsic Amplitude: 2.125 mV
Lead Channel Sensing Intrinsic Amplitude: 2.125 mV
Lead Channel Sensing Intrinsic Amplitude: 8.125 mV
Lead Channel Sensing Intrinsic Amplitude: 8.125 mV
Lead Channel Setting Pacing Amplitude: 2 V
Lead Channel Setting Pacing Amplitude: 2 V
Lead Channel Setting Pacing Pulse Width: 0.4 ms
Lead Channel Setting Sensing Sensitivity: 0.9 mV

## 2021-11-18 NOTE — Progress Notes (Signed)
? ?  ?Patient: Mario Fisher  ?DOB: Aug 11, 1934 ?MRN: [id number] ?PCP: Kristen Loader, FNP  ? ? ?Chief Complaint  ?Patient presents with  ? Hospitalization Follow-up  ?  ? ?Patient Active Problem List  ? Diagnosis Date Noted  ? Hypokalemia 11/05/2021  ? E coli bacteremia 11/04/2021  ? Paroxysmal atrial fibrillation ([place]) 11/04/2021  ? Hyponatremia 11/04/2021  ? SIRS (systemic inflammatory response syndrome) ([place]) 11/03/2021  ? Duodenitis 11/03/2021  ? Elevated LFTs 11/03/2021  ? Chest pain 11/03/2021  ? Rheumatoid arthritis ([place]) 11/03/2021  ? Ankylosing spondylitis ([place]) 11/03/2021  ? Stage 3a chronic kidney disease (CKD) ([place]) 11/03/2021  ? Atherosclerosis 11/03/2021  ? BPH (benign prostatic hyperplasia) 11/03/2021  ? Normocytic anemia 11/03/2021  ? Sick sinus syndrome ([place]) 04/08/2017  ? Sinus bradycardia 07/05/2013  ? Heart palpitations 02/24/2012  ? HTN (hypertension) 06/05/2011  ?  ? ?Subjective:  ?Mario Fisher is a 88 y.o. M with PMHx as below presents for hospital follow-up. Admitted to [facility] 3/5-3/8 for duodenitis,  found to have Ecoli bacteremia. CT showed  stranding in the inferior aspect of gallbladder fossa and adjacent to right lobe of liver likely 2/2 duodenitis. US showed no intrahepatic and extrahepatic biliary ductal dilation. GI consulted and MRCP not done as LFTs trended down and imaging showed unremarkable CBD. Pt has outpatient GI follow-up. ID consulted and discharged on 2 weeks of antibiotics for bacteremia 2/2 GI source with cefadroxil and metronidazole EOT 11/16/21. ? ?Today: Pt's wife present at visit as well. Completed antibiotics this AM. No missed doses. Pt denies N/V. He has a bout of diarrhea, which is improving.  ? ?Review of Systems  ?All other systems reviewed and are negative. ? ?Past Medical History:  ?Diagnosis Date  ? Arthritis   ? "back" (04/08/2017)  ? Atrial fibrillation ([place])   ? B12 deficiency anemia   ? BPH (benign prostatic hypertrophy)   ? Chronic  anticoagulation   ? on Pradaxa, now on xarelto not pradaxa  ? Chronic lower back pain   ? Glaucoma, both eyes   ? Glucose intolerance (impaired glucose tolerance)   ? History of blood transfusion   ? "I'm thinking they gave me blood when I had the gallbladder OR" (04/08/2017)  ? History of gout   ? History of hiatal hernia   ? Hyperlipidemia   ? Hypertension   ? OSA on CPAP   ? Presence of permanent cardiac pacemaker   ? Rheumatoid arthritis ([place])   ? Stroke [facility])   ? remote CVA noted on MRI; "silent stroke; didn't know when it happened" (04/08/2017)  ? ? ?Outpatient Medications Prior to Visit  ?Medication Sig Dispense Refill  ? allopurinol (ZYLOPRIM) 100 MG tablet Take 100 mg by mouth daily.    ? cefadroxil (DURICEF) 500 MG capsule Take 2 capsules by mouth 2  times daily for 11 days. Stop date 11/17/21 46 capsule 0  ? cyanocobalamin (,VITAMIN B-12,) 1000 MCG/ML injection Inject 1,000 mcg into the muscle every 30 (thirty) days.    ? dorzolamide (TRUSOPT) 2 % ophthalmic solution Place 1 drop into both eyes 2 (two) times daily.  12  ? Glucosamine-Chondroit-Vit C-Mn (GLUCOSAMINE CHONDR 1500 COMPLX PO) Take 1 tablet by mouth daily.    ? metroNIDAZOLE (FLAGYL) 500 MG tablet Take 1 tablet by mouth every 12  hours for 11 days. Stop date 11/17/21 23 tablet 0  ? Multiple Vitamins-Minerals (MULTIVITAMIN ADULTS 50+ PO) Take 1 tablet by mouth daily.    ? Omega-3  Fatty Acids (FISH OIL) 1200 MG CAPS Take 1,200-2,400 mg by mouth See admin instructions. Take 2400 mg by mouth in the morning and take 1200 mg by mouth at bedtime    ? pantoprazole (PROTONIX) 40 MG tablet Take 1 tablet by mouth daily. 30 tablet 1  ? Propylene Glycol 0.6 % SOLN Place 1 drop into both eyes 2 (two) times daily.    ? Rivaroxaban (XARELTO) 15 MG TABS tablet Take 1 tablet (15 mg total) by mouth daily with supper. 90 tablet 1  ? tamsulosin (FLOMAX) 0.4 MG CAPS capsule Take 0.4 mg by mouth daily.    ? Upadacitinib ER (RINVOQ) 15 MG TB24 Take 15 mg by mouth daily.     ? valsartan (DIOVAN) 80 MG tablet Take 40-80 mg by mouth daily. Patient checks BP and if its low will only take 1/2 tab = 40mg     ? valsartan-hydrochlorothiazide (DIOVAN-HCT) 80-12.5 MG tablet Take 1 tablet by mouth every morning.    ? ?No facility-administered medications prior to visit.  ?  ? ?Allergies  ?Allergen Reactions  ? Etanercept   ?  Other reaction(s): rash  ? Statins Other (See Comments)  ?  Joint pain  ? Cholesterol Rash  ? ? ?Social History  ? ?Tobacco Use  ? Smoking status: Former  ?  Types: Cigars  ?  Quit date: 09/01/1968  ?  Years since quitting: 53.2  ? Smokeless tobacco: Never  ?Vaping Use  ? Vaping Use: Never used  ?Substance Use Topics  ? Alcohol use: No  ? Drug use: No  ? ? ?Family History  ?Problem Relation Age of Onset  ? Arrhythmia Mother   ? Heart attack Father   ?     x2  ? Hypertension Father   ? Stroke Father   ? Heart disease Sister   ? Hypertension Brother   ? ? ?Objective:  ? ?Vitals:  ? 11/18/21 1125  ?BP: (!) 154/85  ?Pulse: 73  ?Temp: (!) 97.5 ?F (36.4 ?C)  ?TempSrc: Oral  ?Weight: 166 lb (75.3 kg)  ? ?Body mass index is 26.79 kg/m?. ? ?Physical Exam ?Constitutional:   ?   General: He is not in acute distress. ?   Appearance: He is normal weight. He is not toxic-appearing.  ?HENT:  ?   Head: Normocephalic and atraumatic.  ?   Right Ear: External ear normal.  ?   Left Ear: External ear normal.  ?   Nose: No congestion or rhinorrhea.  ?   Mouth/Throat:  ?   Mouth: Mucous membranes are moist.  ?   Pharynx: Oropharynx is clear.  ?Eyes:  ?   Extraocular Movements: Extraocular movements intact.  ?   Conjunctiva/sclera: Conjunctivae normal.  ?   Pupils: Pupils are equal, round, and reactive to light.  ?   Comments: Scleral icterus resolving  ?Cardiovascular:  ?   Rate and Rhythm: Normal rate and regular rhythm.  ?   Heart sounds: No murmur heard. ?  No friction rub. No gallop.  ?Pulmonary:  ?   Effort: Pulmonary effort is normal.  ?   Breath sounds: Normal breath sounds.  ?Abdominal:  ?    General: Abdomen is flat. Bowel sounds are normal.  ?   Palpations: Abdomen is soft.  ?Musculoskeletal:     ?   General: No swelling. Normal range of motion.  ?   Cervical back: Normal range of motion and neck supple.  ?Skin: ?   General: Skin is warm and dry.  ?  Comments: Jaundice improving  ?Neurological:  ?   General: No focal deficit present.  ?   Mental Status: He is oriented to person, place, and time.  ?Psychiatric:     ?   Mood and Affect: Mood normal.  ? ? ?Lab Results: ?Lab Results  ?Component Value Date  ? WBC 4.0 11/06/2021  ? HGB 11.7 (L) 11/06/2021  ? HCT 35.0 (L) 11/06/2021  ? MCV 95.1 11/06/2021  ? PLT 200 11/06/2021  ?  ?Lab Results  ?Component Value Date  ? CREATININE 1.05 11/06/2021  ? BUN 11 11/06/2021  ? NA 137 11/06/2021  ? K 3.8 11/06/2021  ? CL 103 11/06/2021  ? CO2 25 11/06/2021  ?  ?Lab Results  ?Component Value Date  ? ALT 121 (H) 11/06/2021  ? AST 46 (H) 11/06/2021  ? ALKPHOS 243 (H) 11/06/2021  ? BILITOT 2.2 (H) 11/06/2021  ?  ? ?Assessment & Plan:  ?#Ecoli bacteremia 2/2 likely GI etiology ?-Competed 2 weeks of antibiotics with cefadroxil and metronidazole  ?-Plan to see GI on 11/21/21 ?-Follow-up with ID PRN ?Laurice Record, MD ?[facility] ?[facility] ? ? ?11/18/21  ?11:40 AM  ?

## 2021-11-21 DIAGNOSIS — R945 Abnormal results of liver function studies: Secondary | ICD-10-CM | POA: Diagnosis not present

## 2021-11-21 DIAGNOSIS — R748 Abnormal levels of other serum enzymes: Secondary | ICD-10-CM | POA: Diagnosis not present

## 2021-11-27 NOTE — Progress Notes (Signed)
Remote pacemaker transmission.   

## 2021-11-28 ENCOUNTER — Encounter (HOSPITAL_COMMUNITY): Payer: Self-pay | Admitting: Radiology

## 2021-11-29 ENCOUNTER — Encounter (HOSPITAL_BASED_OUTPATIENT_CLINIC_OR_DEPARTMENT_OTHER): Payer: Self-pay | Admitting: Family

## 2021-11-29 ENCOUNTER — Ambulatory Visit (HOSPITAL_BASED_OUTPATIENT_CLINIC_OR_DEPARTMENT_OTHER): Payer: Medicare Other | Admitting: Family

## 2021-11-29 VITALS — BP 146/90 | HR 97 | Ht 66.0 in | Wt 168.7 lb

## 2021-11-29 DIAGNOSIS — Z950 Presence of cardiac pacemaker: Secondary | ICD-10-CM

## 2021-11-29 DIAGNOSIS — I10 Essential (primary) hypertension: Secondary | ICD-10-CM

## 2021-11-29 DIAGNOSIS — D6859 Other primary thrombophilia: Secondary | ICD-10-CM | POA: Diagnosis not present

## 2021-11-29 DIAGNOSIS — I480 Paroxysmal atrial fibrillation: Secondary | ICD-10-CM | POA: Diagnosis not present

## 2021-11-29 DIAGNOSIS — K551 Chronic vascular disorders of intestine: Secondary | ICD-10-CM | POA: Diagnosis not present

## 2021-11-29 DIAGNOSIS — I495 Sick sinus syndrome: Secondary | ICD-10-CM | POA: Diagnosis not present

## 2021-11-29 NOTE — Patient Instructions (Signed)
Medication Instructions:  ?None ordered today ? ?*If you need a refill on your cardiac medications before your next appointment, please call your pharmacy* ? ? ?Lab Work: ?None ordered today ? ?If you have labs (blood work) drawn today and your tests are completely normal, you will receive your results only by: ?MyChart Message (if you have MyChart) OR ?A paper copy in the mail ?If you have any lab test that is abnormal or we need to change your treatment, we will call you to review the results. ? ? ?Testing/Procedures: ?Your echocardiogram in the hospital showed your heart pumping function was normal.  ? ? ?Follow-Up: ?At [facility], you and your health needs are our priority.  As part of our continuing mission to provide you with exceptional heart care, we have created designated Provider Care Teams.  These Care Teams include your primary Cardiologist (physician) and Advanced Practice Providers (APPs -  Physician Assistants and Nurse Practitioners) who all work together to provide you with the care you need, when you need it. ? ?We recommend signing up for the patient portal called "MyChart".  Sign up information is provided on this After Visit Summary.  MyChart is used to connect with patients for Virtual Visits (Telemedicine).  Patients are able to view lab/test results, encounter notes, upcoming appointments, etc.  Non-urgent messages can be sent to your provider as well.   ?To learn more about what you can do with MyChart, go to [email or web address].   ? ?Your next appointment:   ?September 18th at 11:20 AM with Dr. Elease HashimotoNahser ? ?Other Instructions ? ?Heart Healthy Diet Recommendations: ?A low-salt diet is recommended. Meats should be grilled, baked, or boiled. Avoid fried foods. Focus on lean protein sources like fish or chicken with vegetables and fruits. The American Heart Association is a Chief Technology OfficerGREAT resource!  American Heart Association Diet and Lifeystyle Recommendations   ? ?Exercise recommendations: ?The  American Heart Association recommends 150 minutes of moderate intensity exercise weekly. ?Try 30 minutes of moderate intensity exercise 4-5 times per week. ?This could include walking, jogging, or swimming. ?  ?

## 2021-11-29 NOTE — Progress Notes (Signed)
? ?Office Visit  ?  ?Patient Name: Mario Fisher ?Date of Encounter: 11/29/2021 ? ?PCP:  Kristen Loader, FNP ?  ?[place]  ?Cardiologist:  Mertie Moores, MD  ?Advanced Practice Provider:  No care team member to display ?Electrophysiologist:  None  ?   ? ?Chief Complaint  ?  ?Mario Fisher is a 88 y.o. male with a hx of paroxysmal atrial fibrillation, SSS s/p PPM, hypertension, hyperlipidemia, ankylosing spondylolysis, RA, CKD 3 presents today for hospital follow-up ? ?Past Medical History  ?  ?Past Medical History:  ?Diagnosis Date  ? Arthritis   ? "back" (04/08/2017)  ? Atrial fibrillation ([place])   ? B12 deficiency anemia   ? BPH (benign prostatic hypertrophy)   ? Chronic anticoagulation   ? on Pradaxa, now on xarelto not pradaxa  ? Chronic lower back pain   ? Glaucoma, both eyes   ? Glucose intolerance (impaired glucose tolerance)   ? History of blood transfusion   ? "I'm thinking they gave me blood when I had the gallbladder OR" (04/08/2017)  ? History of gout   ? History of hiatal hernia   ? Hyperlipidemia   ? Hypertension   ? OSA on CPAP   ? Presence of permanent cardiac pacemaker   ? Rheumatoid arthritis ([place])   ? Stroke [facility])   ? remote CVA noted on MRI; "silent stroke; didn't know when it happened" (04/08/2017)  ? ?Past Surgical History:  ?Procedure Laterality Date  ? CARDIOVERSION  04/08/2017  ? Procedure: Cardioversion;  Surgeon: Thompson Grayer, MD;  Location: [facility];  Service: Cardiovascular;;  ? CARDIOVERSION N/A 07/27/2018  ? Procedure: CARDIOVERSION;  Surgeon: Lelon Perla, MD;  Location: [facility] ENDOSCOPY;  Service: Cardiovascular;  Laterality: N/A;  ? CATARACT EXTRACTION W/ INTRAOCULAR LENS  IMPLANT, BILATERAL Bilateral   ? CHOLECYSTECTOMY OPEN    ? PACEMAKER IMPLANT N/A 04/08/2017  ? Medtronic Azure XT dual-chamber MRI conditional pacemaker for symptomatic bradycardia by Dr Rayann Heman  ? US ECHOCARDIOGRAPHY  02/25/2005  ? ef 55-60%  ? ? ?Allergies ? ?Allergies  ?Allergen  Reactions  ? Etanercept   ?  Other reaction(s): rash  ? Statins Other (See Comments)  ?  Joint pain  ? Cholesterol Rash  ? ? ?History of Present Illness  ?  ?Mario Fisher is a 88 y.o. male with a hx of paroxysmal atrial fibrillation, SSS s/p PPM, hypertension, hyperlipidemia, ankylosing spondylolysis, RA, CKD last seen 06/2021 by Tommye Standard, PA. ? ?He has followed routinely with Dr. Acie Fredrickson and EP.  He saw Dr. Acie Fredrickson 11/2020 and noted continuing to understandably grieve his son who had recently passed away from complications from an MI.  His hydrochlorothiazide was stopped at that time due to relative hypotension and [place] continued.  He was seen in EP follow-up 06/2021 noted to have low atrial fibrillation burden on device reports and no changes were made. ? ?He was admitted 3/4 - 11/06/2021 after presenting with fever, chills, abdominal pain.  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  He was started on IV antibiotics.  Found of E. coli bacteremia.  ID consulted.  Echo during admission with LVEF 50-55%, indeterminate LV diastolic parameters, no evidence of vegetation.  He was recommended for treatment of duodenitis and then check a mesenteric duplex with outpatient follow-up with vascular surgery. ? ?He presents today for follow-up with his wife. Shares with  me that they have been married nearly 37 years. Endorses BP at home when check by arm cuff routinely <130/80. He takes Valsartan 40mg  or 80mg  daily depending what his BP is when he wakes up. No recent difficulties with hypotension. Reports no shortness of breath nor dyspnea on exertion. He has occasional right sided chest discomfort occurring at rest which self resolves after a few seconds or minutes. We discussed that this was atypical for angina. He has not had recurrent abdominal pain since discharge. Notes less  indigestion while taking Protonix. No edema, orthopnea, PND. Reports no palpitations.  Notes energy level gradually increasing since hospital discharge.  ? ?EKGs/Labs/Other Studies Reviewed:  ? ?The following studies were reviewed today: ? ?Echo bubble 11/04/21 ? 1. Left ventricular ejection fraction, by estimation, is 50 to 55%. The  ?left ventricle has low normal function. The left ventricle has no regional  ?wall motion abnormalities. Left ventricular diastolic parameters are  ?indeterminate.  ? 2. Right ventricular systolic function is normal. The right ventricular  ?size is normal. There is mildly elevated pulmonary artery systolic  ?pressure. The estimated right ventricular systolic pressure is 123456 mmHg.  ? 3. Left atrial size was moderately dilated.  ? 4. Right atrial size was mildly dilated.  ? 5. The mitral valve is normal in structure. Trivial mitral valve  ?regurgitation. No evidence of mitral stenosis.  ? 6. The aortic valve is tricuspid. There is mild calcification of the  ?aortic valve. Aortic valve regurgitation is not visualized. Aortic valve  ?sclerosis/calcification is present, without any evidence of aortic  ?stenosis.  ? 7. The inferior vena cava is dilated in size with <50% respiratory  ?variability, suggesting right atrial pressure of 15 mmHg.  ? 8. Negative bubble study, no PFO or ASD.  ? 9. The patient was in atrial fibrillation.  ? ?EKG:  No EKG today.  ? ?Recent Labs: ?11/06/2021: ALT 121; BUN 11; Creatinine, Ser 1.05; Hemoglobin 11.7; Magnesium 1.6; Platelets 200; Potassium 3.8; Sodium 137  ?Recent Lipid Panel ?   ?Component Value Date/Time  ? CHOL 149 06/20/2016 1117  ? TRIG 128 06/20/2016 1117  ? HDL 42 06/20/2016 1117  ? CHOLHDL 3.5 06/20/2016 1117  ? VLDL 26 06/20/2016 1117  ? [place] 81 06/20/2016 1117  ? ? ?Risk Assessment/Calculations:  ? ?CHA2DS2-VASc Score = 4  ? This indicates a 4.8% annual risk of stroke. ?The patient's score is based upon: ?CHF History: 0 ?HTN History: 1 ?Diabetes  History: 0 ?Stroke History: 0 ?Vascular Disease History: 1 ?Age Score: 2 ?Gender Score: 0 ?  ?Home Medications  ? ?Current Meds  ?Medication Sig  ? allopurinol (ZYLOPRIM) 100 MG tablet Take 100 mg by mouth daily.  ? cyanocobalamin (,VITAMIN B-12,) 1000 MCG/ML injection Inject 1,000 mcg into the muscle every 30 (thirty) days.  ? dorzolamide (TRUSOPT) 2 % ophthalmic solution Place 1 drop into both eyes 2 (two) times daily.  ? Glucosamine-Chondroit-Vit C-Mn (GLUCOSAMINE CHONDR 1500 COMPLX PO) Take 1 tablet by mouth daily.  ? hydrochlorothiazide (MICROZIDE) 12.5 MG capsule Take 12.5 mg by mouth daily as needed (swelling).  ? Multiple Vitamins-Minerals (MULTIVITAMIN ADULTS 50+ PO) Take 1 tablet by mouth daily.  ? Omega-3 Fatty Acids (FISH OIL) 1200 MG CAPS Take 1,200-2,400 mg by mouth See admin instructions. Take 2400 mg by mouth in the morning and take 1200 mg by mouth at bedtime  ? pantoprazole (PROTONIX) 40 MG tablet Take 1 tablet by mouth daily.  ? Propylene Glycol 0.6 % SOLN Place 1 drop  into both eyes 2 (two) times daily.  ? Rivaroxaban (XARELTO) 15 MG TABS tablet Take 1 tablet (15 mg total) by mouth daily with supper.  ? tamsulosin (FLOMAX) 0.4 MG CAPS capsule Take 0.4 mg by mouth daily.  ? valsartan (DIOVAN) 80 MG tablet Take 40-80 mg by mouth daily. Patient checks BP and if its low will only take 1/2 tab = 40mg   ? [DISCONTINUED] valsartan-hydrochlorothiazide (DIOVAN-HCT) 80-12.5 MG tablet Take 1 tablet by mouth every morning.  ?  ? ?Review of Systems  ? ?All other systems reviewed and are otherwise negative except as noted above. ? ?Physical Exam  ?  ?VS:  BP (!) 146/90 (BP Location: Left Arm, Patient Position: Sitting, Cuff Size: Normal)   Pulse 97   Ht 5\' 6"  (1.676 m)   Wt 168 lb 11.2 oz (76.5 kg)   SpO2 99%   BMI 27.23 kg/m?  , BMI Body mass index is 27.23 kg/m?. ? ?Wt Readings from Last 3 Encounters:  ?11/29/21 168 lb 11.2 oz (76.5 kg)  ?11/18/21 166 lb (75.3 kg)  ?11/06/21 161 lb 2.5 oz (73.1 kg)  ?   ? ?GEN: Well nourished, well developed, in no acute distress. ?HEENT: normal. ?Neck: Supple, no JVD, carotid bruits, or masses. ?Cardiac: RRR, no murmurs, rubs, or gallops. No clubbing, cyanosis, edema.  Rad

## 2021-12-02 ENCOUNTER — Other Ambulatory Visit (HOSPITAL_COMMUNITY): Payer: Self-pay

## 2021-12-02 ENCOUNTER — Ambulatory Visit: Payer: Medicare Other | Admitting: Vascular Surgery

## 2021-12-02 ENCOUNTER — Encounter: Payer: Self-pay | Admitting: Vascular Surgery

## 2021-12-02 VITALS — BP 153/79 | HR 60 | Temp 98.3°F | Resp 16 | Ht 60.0 in | Wt 163.0 lb

## 2021-12-02 DIAGNOSIS — K551 Chronic vascular disorders of intestine: Secondary | ICD-10-CM | POA: Diagnosis not present

## 2021-12-02 NOTE — Progress Notes (Signed)
?Office Note  ? ? ? ?CC:  SMA stenosis ?Requesting Provider:  Kristen Loader, FNP ? ?HPI: Mario Fisher is a 88 y.o. (Nov 29, 1933) male presenting at the request of .Mario Loader, FNP after recent hospitalization for abdominal pain. Work-up was negative and believed to be side effect from his rheumatoid arthritis medication.  Work-up included CT with contrast of the abdomen pelvis.  This demonstrated significant stenosis of the superior mesenteric artery, for which vascular surgery was called.  At the time of admission, he was asymptomatic.  He presents in follow-up today to discuss these findings, and those of his recent mesenteric duplex ultrasound. ? ?On exam, Mario Fisher was doing well, accompanied by his wife.  His abdominal pain improved over the course of his hospitalization after changing his rheumatoid arthritis medications and starting Prilosec.  He described initial symptoms at his intermittent abdominal pain, sometimes worse postprandially.  He has had no issues since his discharge.  He denies significant weight loss or food fear, poor diet.  No postprandial pain. ? ?The pt is not on a statin for cholesterol management.  ?The pt is not on a daily aspirin.   Other AC:  Xarelto ?The pt is  on medication for hypertension.   ?The pt is not diabetic.  ?Tobacco hx:  - ? ?Past Medical History:  ?Diagnosis Date  ? Arthritis   ? "back" (04/08/2017)  ? Atrial fibrillation ([place])   ? B12 deficiency anemia   ? BPH (benign prostatic hypertrophy)   ? Chronic anticoagulation   ? on Pradaxa, now on xarelto not pradaxa  ? Chronic lower back pain   ? Glaucoma, both eyes   ? Glucose intolerance (impaired glucose tolerance)   ? History of blood transfusion   ? "I'm thinking they gave me blood when I had the gallbladder OR" (04/08/2017)  ? History of gout   ? History of hiatal hernia   ? Hyperlipidemia   ? Hypertension   ? OSA on CPAP   ? Presence of permanent cardiac pacemaker   ? Rheumatoid arthritis ([place])   ? Stroke [facility])   ?  remote CVA noted on MRI; "silent stroke; didn't know when it happened" (04/08/2017)  ? ? ?Past Surgical History:  ?Procedure Laterality Date  ? CARDIOVERSION  04/08/2017  ? Procedure: Cardioversion;  Surgeon: Thompson Grayer, MD;  Location: [place] [facility];  Service: Cardiovascular;;  ? CARDIOVERSION N/A 07/27/2018  ? Procedure: CARDIOVERSION;  Surgeon: Lelon Perla, MD;  Location: [facility] ENDOSCOPY;  Service: Cardiovascular;  Laterality: N/A;  ? CATARACT EXTRACTION W/ INTRAOCULAR LENS  IMPLANT, BILATERAL Bilateral   ? CHOLECYSTECTOMY OPEN    ? PACEMAKER IMPLANT N/A 04/08/2017  ? Medtronic Azure XT dual-chamber MRI conditional pacemaker for symptomatic bradycardia by Dr Rayann Heman  ? US ECHOCARDIOGRAPHY  02/25/2005  ? ef 55-60%  ? ? ?Social History  ? ?Socioeconomic History  ? Marital status: Married  ?  Spouse name: Not on file  ? Number of children: Not on file  ? Years of education: Not on file  ? Highest education level: Not on file  ?Occupational History  ? Not on file  ?Tobacco Use  ? Smoking status: Former  ?  Types: Cigars  ?  Quit date: 09/01/1968  ?  Years since quitting: 53.2  ? Smokeless tobacco: Never  ?Vaping Use  ? Vaping Use: Never used  ?Substance and Sexual Activity  ? Alcohol use: No  ? Drug use: No  ? Sexual activity: Not Currently  ?  Other Topics Concern  ? Not on file  ?Social History Narrative  ? Not on file  ? ?Social Determinants of Health  ? ?Financial Resource Strain: Not on file  ?Food Insecurity: Not on file  ?Transportation Needs: Not on file  ?Physical Activity: Not on file  ?Stress: Not on file  ?Social Connections: Not on file  ?Intimate Partner Violence: Not on file  ? ? ?Family History  ?Problem Relation Age of Onset  ? Arrhythmia Mother   ? Heart attack Father   ?     x2  ? Hypertension Father   ? Stroke Father   ? Heart disease Sister   ? Hypertension Brother   ? ? ?Current Outpatient Medications  ?Medication Sig Dispense Refill  ? allopurinol (ZYLOPRIM) 100 MG tablet Take 100 mg by mouth  daily.    ? cyanocobalamin (,VITAMIN B-12,) 1000 MCG/ML injection Inject 1,000 mcg into the muscle every 30 (thirty) days.    ? dorzolamide (TRUSOPT) 2 % ophthalmic solution Place 1 drop into both eyes 2 (two) times daily.  12  ? Glucosamine-Chondroit-Vit C-Mn (GLUCOSAMINE CHONDR 1500 COMPLX PO) Take 1 tablet by mouth daily.    ? hydrochlorothiazide (MICROZIDE) 12.5 MG capsule Take 12.5 mg by mouth daily as needed (swelling).    ? Multiple Vitamins-Minerals (MULTIVITAMIN ADULTS 50+ PO) Take 1 tablet by mouth daily.    ? Omega-3 Fatty Acids (FISH OIL) 1200 MG CAPS Take 1,200-2,400 mg by mouth See admin instructions. Take 2400 mg by mouth in the morning and take 1200 mg by mouth at bedtime    ? pantoprazole (PROTONIX) 40 MG tablet Take 1 tablet by mouth daily. 30 tablet 1  ? Propylene Glycol 0.6 % SOLN Place 1 drop into both eyes 2 (two) times daily.    ? Rivaroxaban (XARELTO) 15 MG TABS tablet Take 1 tablet (15 mg total) by mouth daily with supper. 90 tablet 1  ? tamsulosin (FLOMAX) 0.4 MG CAPS capsule Take 0.4 mg by mouth daily.    ? Upadacitinib ER (RINVOQ) 15 MG TB24 Take 15 mg by mouth daily.    ? valsartan (DIOVAN) 80 MG tablet Take 40-80 mg by mouth daily. Patient checks BP and if its low will only take 1/2 tab = 40mg     ? ?No current facility-administered medications for this visit.  ? ? ?Allergies  ?Allergen Reactions  ? Etanercept   ?  Other reaction(s): rash  ? Statins Other (See Comments)  ?  Joint pain  ? Cholesterol Rash  ? ? ? ?REVIEW OF SYSTEMS:  ? ?[X]  denotes positive finding, [ ]  denotes negative finding ?Cardiac  Comments:  ?Chest pain or chest pressure:    ?Shortness of breath upon exertion:    ?Short of breath when lying flat:    ?Irregular heart rhythm:    ?    ?Vascular    ?Pain in calf, thigh, or hip brought on by ambulation:    ?Pain in feet at night that wakes you up from your sleep:     ?Blood clot in your veins:    ?Leg swelling:     ?    ?Pulmonary    ?Oxygen at home:    ?Productive  cough:     ?Wheezing:     ?    ?Neurologic    ?Sudden weakness in arms or legs:     ?Sudden numbness in arms or legs:     ?Sudden onset of difficulty speaking or slurred speech:    ?Temporary  loss of vision in one eye:     ?Problems with dizziness:     ?    ?Gastrointestinal    ?Blood in stool:     ?Vomited blood:     ?    ?Genitourinary    ?Burning when urinating:     ?Blood in urine:    ?    ?Psychiatric    ?Major depression:     ?    ?Hematologic    ?Bleeding problems:    ?Problems with blood clotting too easily:    ?    ?Skin    ?Rashes or ulcers:    ?    ?Constitutional    ?Fever or chills:    ? ? ?PHYSICAL EXAMINATION: ? ?Vitals:  ? 12/02/21 1227  ?BP: (!) 153/79  ?Pulse: 60  ?Resp: 16  ?Temp: 98.3 ?F (36.8 ?C)  ?TempSrc: Temporal  ?SpO2: 100%  ?Weight: 163 lb (73.9 kg)  ?Height: 5' (1.524 m)  ? ? ?General:  WDWN in NAD; vital signs documented above ?Gait: Not observed ?HENT: WNL, normocephalic ?Pulmonary: normal non-labored breathing , without wheezing ?Cardiac: regular HR ?Abdomen: soft, NT, no masses ?Skin: without rashes ?Vascular Exam/Pulses: ? Right Left  ?Radial 2+ (normal) 2+ (normal)  ?Ulnar 2+ (normal) 2+ (normal)  ?Femoral    ?Popliteal    ?DP 2+ (normal) 2+ (normal)  ?PT 2+ (normal) 2+ (normal)  ? ?Extremities: without ischemic changes, without Gangrene , without cellulitis; without open wounds;  ?Musculoskeletal: no muscle wasting or atrophy  ?Neurologic: A&O X 3;  No focal weakness or paresthesias are detected ?Psychiatric:  The pt has Normal affect. ? ? ?Non-Invasive Vascular Imaging:   ?Duplex Findings:  ?+----------------------+--------+--------+------+--------------+  ?Mesenteric            PSV cm/sEDV cm/sPlaque   Comments     ?+----------------------+--------+--------+------+--------------+  ?Aorta at Celiac          62      11                         ?+----------------------+--------+--------+------+--------------+  ?Aorta Prox               48      7                           ?+----------------------+--------+--------+------+--------------+  ?Aorta Distal             86      0                          ?+----------------------+--------+--------+------+--------------+  ?Celiac Artery Origin    17

## 2021-12-03 DIAGNOSIS — Z13220 Encounter for screening for lipoid disorders: Secondary | ICD-10-CM | POA: Diagnosis not present

## 2021-12-03 DIAGNOSIS — Z79899 Other long term (current) drug therapy: Secondary | ICD-10-CM | POA: Diagnosis not present

## 2021-12-03 DIAGNOSIS — M459 Ankylosing spondylitis of unspecified sites in spine: Secondary | ICD-10-CM | POA: Diagnosis not present

## 2021-12-03 DIAGNOSIS — R7989 Other specified abnormal findings of blood chemistry: Secondary | ICD-10-CM | POA: Diagnosis not present

## 2021-12-03 DIAGNOSIS — M1991 Primary osteoarthritis, unspecified site: Secondary | ICD-10-CM | POA: Diagnosis not present

## 2021-12-03 DIAGNOSIS — M0600 Rheumatoid arthritis without rheumatoid factor, unspecified site: Secondary | ICD-10-CM | POA: Diagnosis not present

## 2021-12-06 ENCOUNTER — Ambulatory Visit: Payer: Medicare Other | Admitting: Vascular Surgery

## 2021-12-19 ENCOUNTER — Other Ambulatory Visit (HOSPITAL_COMMUNITY): Payer: Self-pay

## 2022-01-01 DIAGNOSIS — R112 Nausea with vomiting, unspecified: Secondary | ICD-10-CM | POA: Diagnosis not present

## 2022-01-03 ENCOUNTER — Other Ambulatory Visit: Payer: Self-pay | Admitting: Gastroenterology

## 2022-01-03 DIAGNOSIS — R7989 Other specified abnormal findings of blood chemistry: Secondary | ICD-10-CM

## 2022-01-07 DIAGNOSIS — M0600 Rheumatoid arthritis without rheumatoid factor, unspecified site: Secondary | ICD-10-CM | POA: Diagnosis not present

## 2022-01-07 DIAGNOSIS — T466X5A Adverse effect of antihyperlipidemic and antiarteriosclerotic drugs, initial encounter: Secondary | ICD-10-CM | POA: Diagnosis not present

## 2022-01-07 DIAGNOSIS — N1832 Chronic kidney disease, stage 3b: Secondary | ICD-10-CM | POA: Diagnosis not present

## 2022-01-07 DIAGNOSIS — R7301 Impaired fasting glucose: Secondary | ICD-10-CM | POA: Diagnosis not present

## 2022-01-07 DIAGNOSIS — M109 Gout, unspecified: Secondary | ICD-10-CM | POA: Diagnosis not present

## 2022-01-07 DIAGNOSIS — I480 Paroxysmal atrial fibrillation: Secondary | ICD-10-CM | POA: Diagnosis not present

## 2022-01-07 DIAGNOSIS — E782 Mixed hyperlipidemia: Secondary | ICD-10-CM | POA: Diagnosis not present

## 2022-01-07 DIAGNOSIS — Z0000 Encounter for general adult medical examination without abnormal findings: Secondary | ICD-10-CM | POA: Diagnosis not present

## 2022-01-07 DIAGNOSIS — D6869 Other thrombophilia: Secondary | ICD-10-CM | POA: Diagnosis not present

## 2022-01-07 DIAGNOSIS — I10 Essential (primary) hypertension: Secondary | ICD-10-CM | POA: Diagnosis not present

## 2022-01-15 ENCOUNTER — Other Ambulatory Visit (HOSPITAL_COMMUNITY): Payer: Self-pay | Admitting: Gastroenterology

## 2022-01-15 DIAGNOSIS — R7989 Other specified abnormal findings of blood chemistry: Secondary | ICD-10-CM

## 2022-01-23 DIAGNOSIS — L570 Actinic keratosis: Secondary | ICD-10-CM | POA: Diagnosis not present

## 2022-01-23 DIAGNOSIS — L820 Inflamed seborrheic keratosis: Secondary | ICD-10-CM | POA: Diagnosis not present

## 2022-01-23 DIAGNOSIS — Z86008 Personal history of in-situ neoplasm of other site: Secondary | ICD-10-CM | POA: Diagnosis not present

## 2022-02-12 DIAGNOSIS — H401132 Primary open-angle glaucoma, bilateral, moderate stage: Secondary | ICD-10-CM | POA: Diagnosis not present

## 2022-02-12 DIAGNOSIS — H04123 Dry eye syndrome of bilateral lacrimal glands: Secondary | ICD-10-CM | POA: Diagnosis not present

## 2022-02-17 ENCOUNTER — Ambulatory Visit (INDEPENDENT_AMBULATORY_CARE_PROVIDER_SITE_OTHER): Payer: Medicare Other

## 2022-02-17 DIAGNOSIS — I495 Sick sinus syndrome: Secondary | ICD-10-CM

## 2022-02-18 DIAGNOSIS — G4733 Obstructive sleep apnea (adult) (pediatric): Secondary | ICD-10-CM | POA: Diagnosis not present

## 2022-02-18 LAB — CUP PACEART REMOTE DEVICE CHECK
Battery Remaining Longevity: 75 mo
Battery Voltage: 2.96 V
Brady Statistic AP VP Percent: 27.59 %
Brady Statistic AP VS Percent: 14.93 %
Brady Statistic AS VP Percent: 25.75 %
Brady Statistic AS VS Percent: 31.74 %
Brady Statistic RA Percent Paced: 41.89 %
Brady Statistic RV Percent Paced: 53.33 %
Date Time Interrogation Session: 20230619013725
Implantable Lead Implant Date: 20180808
Implantable Lead Implant Date: 20180808
Implantable Lead Location: 753859
Implantable Lead Location: 753860
Implantable Lead Model: 5076
Implantable Lead Model: 5076
Implantable Pulse Generator Implant Date: 20180808
Lead Channel Impedance Value: 266 Ohm
Lead Channel Impedance Value: 323 Ohm
Lead Channel Impedance Value: 361 Ohm
Lead Channel Impedance Value: 456 Ohm
Lead Channel Pacing Threshold Amplitude: 0.875 V
Lead Channel Pacing Threshold Amplitude: 1 V
Lead Channel Pacing Threshold Pulse Width: 0.4 ms
Lead Channel Pacing Threshold Pulse Width: 0.4 ms
Lead Channel Sensing Intrinsic Amplitude: 2.25 mV
Lead Channel Sensing Intrinsic Amplitude: 2.25 mV
Lead Channel Sensing Intrinsic Amplitude: 7.875 mV
Lead Channel Sensing Intrinsic Amplitude: 7.875 mV
Lead Channel Setting Pacing Amplitude: 2 V
Lead Channel Setting Pacing Amplitude: 2.25 V
Lead Channel Setting Pacing Pulse Width: 0.4 ms
Lead Channel Setting Sensing Sensitivity: 0.9 mV

## 2022-02-28 ENCOUNTER — Other Ambulatory Visit: Payer: Self-pay | Admitting: Cardiovascular Disease

## 2022-02-28 NOTE — Telephone Encounter (Signed)
Prescription refill request for Xarelto received.  Indication:Afib Last office visit:3/23 Weight:73.9 kg Age:88 Scr:1.0 CrCl:55.43 ml/min  Prescription refilled

## 2022-03-05 ENCOUNTER — Ambulatory Visit (HOSPITAL_COMMUNITY)
Admission: RE | Admit: 2022-03-05 | Discharge: 2022-03-05 | Disposition: A | Discharge status: Home / Self Care | Payer: Medicare Other | Source: Ambulatory Visit | Attending: Gastroenterology | Admitting: Gastroenterology

## 2022-03-05 DIAGNOSIS — M419 Scoliosis, unspecified: Secondary | ICD-10-CM | POA: Diagnosis not present

## 2022-03-05 DIAGNOSIS — R7989 Other specified abnormal findings of blood chemistry: Secondary | ICD-10-CM | POA: Diagnosis not present

## 2022-03-05 DIAGNOSIS — N281 Cyst of kidney, acquired: Secondary | ICD-10-CM | POA: Diagnosis not present

## 2022-03-05 DIAGNOSIS — K8689 Other specified diseases of pancreas: Secondary | ICD-10-CM | POA: Diagnosis not present

## 2022-03-05 MED ORDER — GADOBUTROL 1 MMOL/ML IV SOLN
7.5000 mL | Freq: Once | INTRAVENOUS | Status: AC | PRN
Start: 2022-03-05 — End: 2022-03-05
  Administered 2022-03-05: 7.5 mL via INTRAVENOUS

## 2022-03-05 NOTE — Progress Notes (Signed)
Patient here today at [facility] for MR abdomen w wo contrast. Patient has medtronic device. CLE sent.  Orders received for VOO 90. Will re-program once scan is completed.

## 2022-03-06 ENCOUNTER — Telehealth: Payer: Self-pay

## 2022-03-06 NOTE — Telephone Encounter (Signed)
Nonbillable remote reviewed. Normal device function.    Ongoing AF since 02/27/2022, on OAC, ventricular rates are well controlled, sent to triage. Next remote 05/19/2022   Spoke with patient, patient denied any new recent  symptoms increased SOB or swelling of legs

## 2022-03-09 NOTE — Telephone Encounter (Signed)
Please call and offer AF clinic appointment to arrange cardioversion.Mario Fisher

## 2022-03-10 NOTE — Telephone Encounter (Signed)
AFIB CLINIC INFORMATION: Your appointment is scheduled on: 03/12/2022 at 1030. Please arrive 15 minutes early for check-in. The AFib Clinic is located in the Heart and Vascular Specialty Clinics at [facility]. Parking instructions/directions: [employer] social research [address] ([address]). When you pull in to Entrance C, there is an underground parking garage to your right. The code to enter the garage is 1404. Take the elevators to the first floor. Follow the signs to the Heart and Vascular Specialty Clinics. You will see registration at the end of the hallway.  Phone number: [phone]  Parking codes for 2021/12/13. Please remove below information before giving to patient: Dec 2022 - 5555  Jan - 1202 Feb - 1204 Mar - Dec 13, 1100- 1104 May - 1002 June - 1004 July - 1404 Aug - 1403 Sep - 1502 Oct - 1504 Nov - 1603 Dec - 1604

## 2022-03-11 ENCOUNTER — Ambulatory Visit (HOSPITAL_COMMUNITY): Payer: Medicare Other | Admitting: Physician Assistant

## 2022-03-11 NOTE — Progress Notes (Signed)
Remote pacemaker transmission.   

## 2022-03-12 ENCOUNTER — Ambulatory Visit (HOSPITAL_COMMUNITY)
Admission: RE | Admit: 2022-03-12 | Discharge: 2022-03-12 | Disposition: A | Discharge status: Home / Self Care | Payer: Medicare Other | Source: Ambulatory Visit | Attending: Physician Assistant | Admitting: Physician Assistant

## 2022-03-12 ENCOUNTER — Other Ambulatory Visit (HOSPITAL_COMMUNITY): Payer: Self-pay | Admitting: Physician Assistant

## 2022-03-12 ENCOUNTER — Encounter (HOSPITAL_COMMUNITY): Payer: Self-pay | Admitting: Physician Assistant

## 2022-03-12 VITALS — BP 154/90 | HR 79 | Ht 60.0 in | Wt 165.2 lb

## 2022-03-12 DIAGNOSIS — D6869 Other thrombophilia: Secondary | ICD-10-CM | POA: Diagnosis not present

## 2022-03-12 DIAGNOSIS — E669 Obesity, unspecified: Secondary | ICD-10-CM | POA: Diagnosis not present

## 2022-03-12 DIAGNOSIS — I495 Sick sinus syndrome: Secondary | ICD-10-CM | POA: Diagnosis not present

## 2022-03-12 DIAGNOSIS — E785 Hyperlipidemia, unspecified: Secondary | ICD-10-CM | POA: Insufficient documentation

## 2022-03-12 DIAGNOSIS — I4819 Other persistent atrial fibrillation: Secondary | ICD-10-CM

## 2022-03-12 DIAGNOSIS — I10 Essential (primary) hypertension: Secondary | ICD-10-CM | POA: Insufficient documentation

## 2022-03-12 DIAGNOSIS — Z8673 Personal history of transient ischemic attack (TIA), and cerebral infarction without residual deficits: Secondary | ICD-10-CM | POA: Insufficient documentation

## 2022-03-12 DIAGNOSIS — Z6832 Body mass index (BMI) 32.0-32.9, adult: Secondary | ICD-10-CM | POA: Diagnosis not present

## 2022-03-12 DIAGNOSIS — Z950 Presence of cardiac pacemaker: Secondary | ICD-10-CM | POA: Insufficient documentation

## 2022-03-12 DIAGNOSIS — G4733 Obstructive sleep apnea (adult) (pediatric): Secondary | ICD-10-CM | POA: Insufficient documentation

## 2022-03-12 DIAGNOSIS — M069 Rheumatoid arthritis, unspecified: Secondary | ICD-10-CM | POA: Diagnosis not present

## 2022-03-12 LAB — CBC
HCT: 32.2 % — ABNORMAL LOW (ref 39.0–52.0)
Hemoglobin: 11.1 g/dL — ABNORMAL LOW (ref 13.0–17.0)
MCH: 32.1 pg (ref 26.0–34.0)
MCHC: 34.5 g/dL (ref 30.0–36.0)
MCV: 93.1 fL (ref 80.0–100.0)
Platelets: 184 10*3/uL (ref 150–400)
RBC: 3.46 MIL/uL — ABNORMAL LOW (ref 4.22–5.81)
RDW: 14.5 % (ref 11.5–15.5)
WBC: 4 10*3/uL (ref 4.0–10.5)
nRBC: 0 % (ref 0.0–0.2)

## 2022-03-12 LAB — BASIC METABOLIC PANEL
Anion gap: 8 (ref 5–15)
BUN: 16 mg/dL (ref 8–23)
CO2: 23 mmol/L (ref 22–32)
Calcium: 9.5 mg/dL (ref 8.9–10.3)
Chloride: 107 mmol/L (ref 98–111)
Creatinine, Ser: 1.43 mg/dL — ABNORMAL HIGH (ref 0.61–1.24)
GFR, Estimated: 48 mL/min — ABNORMAL LOW (ref >60)
Glucose, Bld: 140 mg/dL — ABNORMAL HIGH (ref 70–99)
Potassium: 3.9 mmol/L (ref 3.5–5.1)
Sodium: 138 mmol/L (ref 135–145)

## 2022-03-12 NOTE — H&P (View-Only) (Signed)
Primary Care Physician: Soundra Pilon, FNP Primary Cardiologist: Dr Elease Hashimoto Primary Electrophysiologist: Dr Johney Frame Referring Physician: Dr Maryella Shivers is a 88 y.o. male with a history of SSS s/p PPM, HTN, HLD, ankylosing spondylolysis, RA, CKD, OSA, prior CVA, atrial fibrillation who presents for follow up in the [facility]. Patient is on Xarelto for a CHADS2VASC score of 6. The device clinic received an alert for ongoing afib starting 02/27/22. There were no specific triggers that he could identify. He has noted increased SOB and mild lower extremity edema over the past 1-2 weeks. No bleeding issues on anticoagulation.   Today, he denies symptoms of palpitations, chest pain, orthopnea, PND, dizziness, presyncope, syncope, snoring, daytime somnolence, bleeding, or neurologic sequela. The patient is tolerating medications without difficulties and is otherwise without complaint today.    Atrial Fibrillation Risk Factors:  he does have symptoms or diagnosis of sleep apnea. he is compliant with CPAP therapy. he does not have a history of rheumatic fever.   he has a BMI of Body mass index is 32.26 kg/m.Marland Kitchen Filed Weights   03/12/22 1505  Weight: 74.9 kg    Family History  Problem Relation Age of Onset   Arrhythmia Mother    Heart attack Father        x2   Hypertension Father    Stroke Father    Heart disease Sister    Hypertension Brother      Atrial Fibrillation Management history:  Previous antiarrhythmic drugs: none Previous cardioversions: 2018, 2019 Previous ablations: none CHADS2VASC score: 6 Anticoagulation history: Xarelto    Past Medical History:  Diagnosis Date   Arthritis    "back" (04/08/2017)   Atrial fibrillation ([facility])    B12 deficiency anemia    BPH (benign prostatic hypertrophy)    Chronic anticoagulation    on Pradaxa, now on xarelto not pradaxa   Chronic lower back pain    Glaucoma, both eyes    Glucose  intolerance (impaired glucose tolerance)    History of blood transfusion    "I'm thinking they gave me blood when I had the gallbladder OR" (04/08/2017)   History of gout    History of hiatal hernia    Hyperlipidemia    Hypertension    OSA on CPAP    Presence of permanent cardiac pacemaker    Rheumatoid arthritis ([facility])    Stroke ([facility])    remote CVA noted on MRI; "silent stroke; didn't know when it happened" (04/08/2017)   Past Surgical History:  Procedure Laterality Date   CARDIOVERSION  04/08/2017   Procedure: Cardioversion;  Surgeon: Hillis Range, MD;  Location: [facility] INVASIVE CV LAB;  Service: Cardiovascular;;   CARDIOVERSION N/A 07/27/2018   Procedure: CARDIOVERSION;  Surgeon: Lewayne Bunting, MD;  Location: [facility] ENDOSCOPY;  Service: Cardiovascular;  Laterality: N/A;   CATARACT EXTRACTION W/ INTRAOCULAR LENS  IMPLANT, BILATERAL Bilateral    CHOLECYSTECTOMY OPEN     PACEMAKER IMPLANT N/A 04/08/2017   Medtronic Azure XT dual-chamber MRI conditional pacemaker for symptomatic bradycardia by Dr Johney Frame   US ECHOCARDIOGRAPHY  02/25/2005   ef 55-60%    Current Outpatient Medications  Medication Sig Dispense Refill   allopurinol (ZYLOPRIM) 100 MG tablet Take 100 mg by mouth daily.     aspirin EC 81 MG tablet Take 81 mg by mouth daily. Swallow whole.     cyanocobalamin (,VITAMIN B-12,) 1000 MCG/ML injection Inject 1,000 mcg into the muscle every 30 (thirty) days.  dorzolamide (TRUSOPT) 2 % ophthalmic solution Place 1 drop into both eyes 2 (two) times daily.  12   Glucosamine-Chondroit-Vit C-Mn (GLUCOSAMINE CHONDR 1500 COMPLX PO) Take 1 tablet by mouth daily.     hydrochlorothiazide (MICROZIDE) 12.5 MG capsule Take 12.5 mg by mouth daily as needed (swelling).     Multiple Vitamins-Minerals (MULTIVITAMIN ADULTS 50+ PO) Take 1 tablet by mouth daily.     Omega-3 Fatty Acids (FISH OIL) 1200 MG CAPS Take 1,200-2,400 mg by mouth See admin instructions. Take 2400 mg by mouth in the morning and take  1200 mg by mouth at bedtime     pantoprazole (PROTONIX) 40 MG tablet Take 1 tablet by mouth daily. 30 tablet 1   Propylene Glycol 0.6 % SOLN Place 1 drop into both eyes 2 (two) times daily.     Rivaroxaban (XARELTO) 15 MG TABS tablet TAKE 1 TABLET BY MOUTH ONCE DAILY WITH SUPPER 90 tablet 1   tamsulosin (FLOMAX) 0.4 MG CAPS capsule Take 0.4 mg by mouth daily.     Upadacitinib ER (RINVOQ) 15 MG TB24 Take 15 mg by mouth daily.     valsartan (DIOVAN) 80 MG tablet Take 40-80 mg by mouth daily. Patient checks BP and if its low will only take 1/2 tab =      No current facility-administered medications for this encounter.    Allergies  Allergen Reactions   Etanercept     Other reaction(s): rash   Statins Other (See Comments)    Joint pain   Cholesterol Rash    Social History   Socioeconomic History   Marital status: Married    Spouse name: Not on file   Number of children: Not on file   Years of education: Not on file   Highest education level: Not on file  Occupational History   Not on file  Tobacco Use   Smoking status: Former    Types: Cigars    Quit date: 09/01/1968    Years since quitting: 53.5   Smokeless tobacco: Never   Tobacco comments:    Former smoker 03/12/22  Vaping Use   Vaping Use: Never used  Substance and Sexual Activity   Alcohol use: No   Drug use: No   Sexual activity: Not Currently  Other Topics Concern   Not on file  Social History Narrative   Not on file   Social Determinants of Health   Financial Resource Strain: Not on file  Food Insecurity: Not on file  Transportation Needs: Not on file  Physical Activity: Not on file  Stress: Not on file  Social Connections: Not on file  Intimate Partner Violence: Not on file     ROS- All systems are reviewed and negative except as per the HPI above.  Physical Exam: Vitals:   03/12/22 1505  BP: (!) 154/90  Pulse: 79  Weight: 74.9 kg  Height: 5' (1.524 m)    GEN- The patient is a well  appearing elderly obese male, alert and oriented x 3 today.   Head- normocephalic, atraumatic Eyes-  Sclera clear, conjunctiva pink Ears- hearing intact Oropharynx- clear Neck- supple  Lungs- Clear to ausculation bilaterally, normal work of breathing Heart- Regular rate and rhythm, no murmurs, rubs or gallops  GI- soft, NT, ND, + BS Extremities- no clubbing, cyanosis, trace bilateral edema MS- no significant deformity or atrophy Skin- no rash or lesion Psych- euthymic mood, full affect Neuro- strength and sensation are intact  Wt Readings from Last 3 Encounters:  03/12/22  74.9 kg  12/02/21 73.9 kg  11/29/21 76.5 kg    EKG today demonstrates  V pacing with underlying afib Vent. rate 79 BPM PR interval * ms QRS duration 164 ms QT/QTcB 438/502 ms  Echo 11/04/21 demonstrated   1. Left ventricular ejection fraction, by estimation, is 50 to 55%. The  left ventricle has low normal function. The left ventricle has no regional  wall motion abnormalities. Left ventricular diastolic parameters are  indeterminate.   2. Right ventricular systolic function is normal. The right ventricular  size is normal. There is mildly elevated pulmonary artery systolic  pressure. The estimated right ventricular systolic pressure is 42.7 mmHg.   3. Left atrial size was moderately dilated.   4. Right atrial size was mildly dilated.   5. The mitral valve is normal in structure. Trivial mitral valve  regurgitation. No evidence of mitral stenosis.   6. The aortic valve is tricuspid. There is mild calcification of the  aortic valve. Aortic valve regurgitation is not visualized. Aortic valve  sclerosis/calcification is present, without any evidence of aortic  stenosis.   7. The inferior vena cava is dilated in size with <50% respiratory  variability, suggesting right atrial pressure of 15 mmHg.   8. Negative bubble study, no PFO or ASD.   9. The patient was in atrial fibrillation.  Epic records are  reviewed at length today  CHA2DS2-VASc Score = 6  The patient's score is based upon: CHF History: 0 HTN History: 1 Diabetes History: 0 Stroke History: 2 Vascular Disease History: 1 Age Score: 2 Gender Score: 0       ASSESSMENT AND PLAN: 1. Persistent Atrial Fibrillation (ICD10:  I48.19) The patient's CHA2DS2-VASc score is 6, indicating a 9.7% annual risk of stroke.   Patient persistently in afib since 6/29 We discussed rhythm control options today. After discussing the risks and benefits, will plan for DCCV. Check bmet/cbc today. Continue Xarelto 15 mg daily, patient denies any missed doses.   2. Secondary Hypercoagulable State (ICD10:  D68.69) The patient is at significant risk for stroke/thromboembolism based upon his CHA2DS2-VASc Score of 6.  Continue Rivaroxaban (Xarelto).   3. Obesity Body mass index is 32.26 kg/m. Lifestyle modification was discussed at length including regular exercise and weight reduction.  4. Obstructive sleep apnea The importance of adequate treatment of sleep apnea was discussed today in order to improve our ability to maintain sinus rhythm long term. Encouraged compliance with CPAP therapy.  5. Symptomatic bradycardia S/p PPM, followed by Dr Johney Frame and the device clinic.  6. HTN Mildly elevated today, will reassess in SR.   Follow up in the AF clinic post DCCV.    Jorja Loa PA-C Afib Clinic [facility] [address] [place], [place] [address] [phone] 03/12/2022 3:14 PM

## 2022-03-12 NOTE — Progress Notes (Signed)
Primary Care Physician: Soundra Pilon, FNP Primary Cardiologist: Dr Elease Hashimoto Primary Electrophysiologist: Dr Johney Frame Referring Physician: Dr Maryella Shivers is a 88 y.o. male with a history of SSS s/p PPM, HTN, HLD, ankylosing spondylolysis, RA, CKD, OSA, prior CVA, atrial fibrillation who presents for follow up in the [facility]. Patient is on Xarelto for a CHADS2VASC score of 6. The device clinic received an alert for ongoing afib starting 02/27/22. There were no specific triggers that he could identify. He has noted increased SOB and mild lower extremity edema over the past 1-2 weeks. No bleeding issues on anticoagulation.   Today, he denies symptoms of palpitations, chest pain, orthopnea, PND, dizziness, presyncope, syncope, snoring, daytime somnolence, bleeding, or neurologic sequela. The patient is tolerating medications without difficulties and is otherwise without complaint today.    Atrial Fibrillation Risk Factors:  he does have symptoms or diagnosis of sleep apnea. he is compliant with CPAP therapy. he does not have a history of rheumatic fever.   he has a BMI of Body mass index is 32.26 kg/m.Marland Kitchen Filed Weights   03/12/22 1505  Weight: 74.9 kg    Family History  Problem Relation Age of Onset   Arrhythmia Mother    Heart attack Father        x2   Hypertension Father    Stroke Father    Heart disease Sister    Hypertension Brother      Atrial Fibrillation Management history:  Previous antiarrhythmic drugs: none Previous cardioversions: 2018, 2019 Previous ablations: none CHADS2VASC score: 6 Anticoagulation history: Xarelto    Past Medical History:  Diagnosis Date   Arthritis    "back" (04/08/2017)   Atrial fibrillation (HCC)    B12 deficiency anemia    BPH (benign prostatic hypertrophy)    Chronic anticoagulation    on Pradaxa, now on xarelto not pradaxa   Chronic lower back pain    Glaucoma, both eyes    Glucose  intolerance (impaired glucose tolerance)    History of blood transfusion    "I'm thinking they gave me blood when I had the gallbladder OR" (04/08/2017)   History of gout    History of hiatal hernia    Hyperlipidemia    Hypertension    OSA on CPAP    Presence of permanent cardiac pacemaker    Rheumatoid arthritis (HCC)    Stroke (HCC)    remote CVA noted on MRI; "silent stroke; didn't know when it happened" (04/08/2017)   Past Surgical History:  Procedure Laterality Date   CARDIOVERSION  04/08/2017   Procedure: Cardioversion;  Surgeon: Hillis Range, MD;  Location: [facility] INVASIVE CV LAB;  Service: Cardiovascular;;   CARDIOVERSION N/A 07/27/2018   Procedure: CARDIOVERSION;  Surgeon: Lewayne Bunting, MD;  Location: [facility] ENDOSCOPY;  Service: Cardiovascular;  Laterality: N/A;   CATARACT EXTRACTION W/ INTRAOCULAR LENS  IMPLANT, BILATERAL Bilateral    CHOLECYSTECTOMY OPEN     PACEMAKER IMPLANT N/A 04/08/2017   Medtronic Azure XT dual-chamber MRI conditional pacemaker for symptomatic bradycardia by Dr Johney Frame   US ECHOCARDIOGRAPHY  02/25/2005   ef 55-60%    Current Outpatient Medications  Medication Sig Dispense Refill   allopurinol (ZYLOPRIM) 100 MG tablet Take 100 mg by mouth daily.     aspirin EC 81 MG tablet Take 81 mg by mouth daily. Swallow whole.     cyanocobalamin (,VITAMIN B-12,) 1000 MCG/ML injection Inject 1,000 mcg into the muscle every 30 (thirty) days.  dorzolamide (TRUSOPT) 2 % ophthalmic solution Place 1 drop into both eyes 2 (two) times daily.  12   Glucosamine-Chondroit-Vit C-Mn (GLUCOSAMINE CHONDR 1500 COMPLX PO) Take 1 tablet by mouth daily.     hydrochlorothiazide (MICROZIDE) 12.5 MG capsule Take 12.5 mg by mouth daily as needed (swelling).     Multiple Vitamins-Minerals (MULTIVITAMIN ADULTS 50+ PO) Take 1 tablet by mouth daily.     Omega-3 Fatty Acids (FISH OIL) 1200 MG CAPS Take 1,200-2,400 mg by mouth See admin instructions. Take 2400 mg by mouth in the morning and take  1200 mg by mouth at bedtime     pantoprazole (PROTONIX) 40 MG tablet Take 1 tablet by mouth daily. 30 tablet 1   Propylene Glycol 0.6 % SOLN Place 1 drop into both eyes 2 (two) times daily.     Rivaroxaban (XARELTO) 15 MG TABS tablet TAKE 1 TABLET BY MOUTH ONCE DAILY WITH SUPPER 90 tablet 1   tamsulosin (FLOMAX) 0.4 MG CAPS capsule Take 0.4 mg by mouth daily.     Upadacitinib ER (RINVOQ) 15 MG TB24 Take 15 mg by mouth daily.     valsartan (DIOVAN) 80 MG tablet Take 40-80 mg by mouth daily. Patient checks BP and if its low will only take 1/2 tab = 40mg     No current facility-administered medications for this encounter.    Allergies  Allergen Reactions   Etanercept     Other reaction(s): rash   Statins Other (See Comments)    Joint pain   Cholesterol Rash    Social History   Socioeconomic History   Marital status: Married    Spouse name: Not on file   Number of children: Not on file   Years of education: Not on file   Highest education level: Not on file  Occupational History   Not on file  Tobacco Use   Smoking status: Former    Types: Cigars    Quit date: 09/01/1968    Years since quitting: 53.5   Smokeless tobacco: Never   Tobacco comments:    Former smoker 03/12/22  Vaping Use   Vaping Use: Never used  Substance and Sexual Activity   Alcohol use: No   Drug use: No   Sexual activity: Not Currently  Other Topics Concern   Not on file  Social History Narrative   Not on file   Social Determinants of Health   Financial Resource Strain: Not on file  Food Insecurity: Not on file  Transportation Needs: Not on file  Physical Activity: Not on file  Stress: Not on file  Social Connections: Not on file  Intimate Partner Violence: Not on file     ROS- All systems are reviewed and negative except as per the HPI above.  Physical Exam: Vitals:   03/12/22 1505  BP: (!) 154/90  Pulse: 79  Weight: 74.9 kg  Height: 5' (1.524 m)    GEN- The patient is a well  appearing elderly obese male, alert and oriented x 3 today.   Head- normocephalic, atraumatic Eyes-  Sclera clear, conjunctiva pink Ears- hearing intact Oropharynx- clear Neck- supple  Lungs- Clear to ausculation bilaterally, normal work of breathing Heart- Regular rate and rhythm, no murmurs, rubs or gallops  GI- soft, NT, ND, + BS Extremities- no clubbing, cyanosis, trace bilateral edema MS- no significant deformity or atrophy Skin- no rash or lesion Psych- euthymic mood, full affect Neuro- strength and sensation are intact  Wt Readings from Last 3 Encounters:  03/12/22   74.9 kg  12/02/21 73.9 kg  11/29/21 76.5 kg    EKG today demonstrates  V pacing with underlying afib Vent. rate 79 BPM PR interval * ms QRS duration 164 ms QT/QTcB 438/502 ms  Echo 11/04/21 demonstrated   1. Left ventricular ejection fraction, by estimation, is 50 to 55%. The  left ventricle has low normal function. The left ventricle has no regional  wall motion abnormalities. Left ventricular diastolic parameters are  indeterminate.   2. Right ventricular systolic function is normal. The right ventricular  size is normal. There is mildly elevated pulmonary artery systolic  pressure. The estimated right ventricular systolic pressure is 42.7 mmHg.   3. Left atrial size was moderately dilated.   4. Right atrial size was mildly dilated.   5. The mitral valve is normal in structure. Trivial mitral valve  regurgitation. No evidence of mitral stenosis.   6. The aortic valve is tricuspid. There is mild calcification of the  aortic valve. Aortic valve regurgitation is not visualized. Aortic valve  sclerosis/calcification is present, without any evidence of aortic  stenosis.   7. The inferior vena cava is dilated in size with <50% respiratory  variability, suggesting right atrial pressure of 15 mmHg.   8. Negative bubble study, no PFO or ASD.   9. The patient was in atrial fibrillation.  Epic records are  reviewed at length today  CHA2DS2-VASc Score = 6  The patient's score is based upon: CHF History: 0 HTN History: 1 Diabetes History: 0 Stroke History: 2 Vascular Disease History: 1 Age Score: 2 Gender Score: 0       ASSESSMENT AND PLAN: 1. Persistent Atrial Fibrillation (ICD10:  I48.19) The patient's CHA2DS2-VASc score is 6, indicating a 9.7% annual risk of stroke.   Patient persistently in afib since 6/29 We discussed rhythm control options today. After discussing the risks and benefits, will plan for DCCV. Check bmet/cbc today. Continue Xarelto 15 mg daily, patient denies any missed doses.   2. Secondary Hypercoagulable State (ICD10:  D68.69) The patient is at significant risk for stroke/thromboembolism based upon his CHA2DS2-VASc Score of 6.  Continue Rivaroxaban (Xarelto).   3. Obesity Body mass index is 32.26 kg/m. Lifestyle modification was discussed at length including regular exercise and weight reduction.  4. Obstructive sleep apnea The importance of adequate treatment of sleep apnea was discussed today in order to improve our ability to maintain sinus rhythm long term. Encouraged compliance with CPAP therapy.  5. Symptomatic bradycardia S/p PPM, followed by Dr Johney Frame and the device clinic.  6. HTN Mildly elevated today, will reassess in SR.   Follow up in the AF clinic post DCCV.    Jorja Loa PA-C [facility] [address] [place], [place] [address] [phone] 03/12/2022 3:14 PM

## 2022-03-12 NOTE — Patient Instructions (Signed)
Couple Days Before Cardioversion Send A Manual Transmission To Confirm AFib  Cardioversion scheduled for Tuesday July 25  - Arrive at the [facility] and go to admitting at 8:30am  - Do not eat or drink anything after midnight the night prior to your procedure.  - Take all your morning medication (except diabetic medications) with a sip of water prior to arrival.  - You will not be able to drive home after your procedure.  - Do NOT miss any doses of your blood thinner - if you should miss a dose please notify our office immediately.  - If you feel as if you go back into normal rhythm prior to scheduled cardioversion, please notify our office immediately. If your procedure is canceled in the cardioversion suite you will be charged a cancellation fee.

## 2022-03-13 DIAGNOSIS — R5383 Other fatigue: Secondary | ICD-10-CM | POA: Diagnosis not present

## 2022-03-13 DIAGNOSIS — M459 Ankylosing spondylitis of unspecified sites in spine: Secondary | ICD-10-CM | POA: Diagnosis not present

## 2022-03-13 DIAGNOSIS — M0600 Rheumatoid arthritis without rheumatoid factor, unspecified site: Secondary | ICD-10-CM | POA: Diagnosis not present

## 2022-03-13 DIAGNOSIS — Z79899 Other long term (current) drug therapy: Secondary | ICD-10-CM | POA: Diagnosis not present

## 2022-03-13 DIAGNOSIS — M545 Low back pain, unspecified: Secondary | ICD-10-CM | POA: Diagnosis not present

## 2022-03-13 DIAGNOSIS — M1991 Primary osteoarthritis, unspecified site: Secondary | ICD-10-CM | POA: Diagnosis not present

## 2022-03-13 DIAGNOSIS — M1009 Idiopathic gout, multiple sites: Secondary | ICD-10-CM | POA: Diagnosis not present

## 2022-03-18 ENCOUNTER — Encounter (HOSPITAL_COMMUNITY): Payer: Self-pay | Admitting: Cardiology

## 2022-03-25 ENCOUNTER — Encounter (HOSPITAL_COMMUNITY): Payer: Self-pay | Admitting: Cardiology

## 2022-03-25 ENCOUNTER — Encounter (HOSPITAL_COMMUNITY)
Admission: RE | Disposition: A | Discharge status: Home / Self Care | Payer: Self-pay | Source: Ambulatory Visit | Attending: Cardiology

## 2022-03-25 ENCOUNTER — Ambulatory Visit (HOSPITAL_COMMUNITY): Payer: Medicare Other | Admitting: General Practice

## 2022-03-25 ENCOUNTER — Ambulatory Visit (HOSPITAL_BASED_OUTPATIENT_CLINIC_OR_DEPARTMENT_OTHER): Payer: Medicare Other | Admitting: General Practice

## 2022-03-25 ENCOUNTER — Other Ambulatory Visit: Payer: Self-pay

## 2022-03-25 ENCOUNTER — Ambulatory Visit (HOSPITAL_COMMUNITY)
Admission: RE | Admit: 2022-03-25 | Discharge: 2022-03-25 | Disposition: A | Discharge status: Home / Self Care | Payer: Medicare Other | Source: Ambulatory Visit | Attending: Cardiology | Admitting: Cardiology

## 2022-03-25 DIAGNOSIS — I129 Hypertensive chronic kidney disease with stage 1 through stage 4 chronic kidney disease, or unspecified chronic kidney disease: Secondary | ICD-10-CM | POA: Diagnosis not present

## 2022-03-25 DIAGNOSIS — I10 Essential (primary) hypertension: Secondary | ICD-10-CM | POA: Diagnosis not present

## 2022-03-25 DIAGNOSIS — Z7901 Long term (current) use of anticoagulants: Secondary | ICD-10-CM | POA: Diagnosis not present

## 2022-03-25 DIAGNOSIS — Z87891 Personal history of nicotine dependence: Secondary | ICD-10-CM

## 2022-03-25 DIAGNOSIS — Z950 Presence of cardiac pacemaker: Secondary | ICD-10-CM | POA: Diagnosis not present

## 2022-03-25 DIAGNOSIS — M069 Rheumatoid arthritis, unspecified: Secondary | ICD-10-CM

## 2022-03-25 DIAGNOSIS — I495 Sick sinus syndrome: Secondary | ICD-10-CM | POA: Diagnosis not present

## 2022-03-25 DIAGNOSIS — Z8673 Personal history of transient ischemic attack (TIA), and cerebral infarction without residual deficits: Secondary | ICD-10-CM | POA: Insufficient documentation

## 2022-03-25 DIAGNOSIS — I4819 Other persistent atrial fibrillation: Secondary | ICD-10-CM | POA: Diagnosis not present

## 2022-03-25 DIAGNOSIS — G4733 Obstructive sleep apnea (adult) (pediatric): Secondary | ICD-10-CM | POA: Insufficient documentation

## 2022-03-25 DIAGNOSIS — Z6832 Body mass index (BMI) 32.0-32.9, adult: Secondary | ICD-10-CM | POA: Insufficient documentation

## 2022-03-25 DIAGNOSIS — N189 Chronic kidney disease, unspecified: Secondary | ICD-10-CM | POA: Diagnosis not present

## 2022-03-25 DIAGNOSIS — I4891 Unspecified atrial fibrillation: Secondary | ICD-10-CM

## 2022-03-25 DIAGNOSIS — K449 Diaphragmatic hernia without obstruction or gangrene: Secondary | ICD-10-CM | POA: Diagnosis not present

## 2022-03-25 DIAGNOSIS — E669 Obesity, unspecified: Secondary | ICD-10-CM | POA: Insufficient documentation

## 2022-03-25 DIAGNOSIS — M4300 Spondylolysis, site unspecified: Secondary | ICD-10-CM | POA: Insufficient documentation

## 2022-03-25 DIAGNOSIS — K219 Gastro-esophageal reflux disease without esophagitis: Secondary | ICD-10-CM | POA: Insufficient documentation

## 2022-03-25 DIAGNOSIS — Z9989 Dependence on other enabling machines and devices: Secondary | ICD-10-CM | POA: Diagnosis not present

## 2022-03-25 HISTORY — PX: CARDIOVERSION: SHX1299

## 2022-03-25 LAB — POCT I-STAT, CHEM 8
BUN: 20 mg/dL (ref 8–23)
Calcium, Ion: 1.21 mmol/L (ref 1.15–1.40)
Chloride: 106 mmol/L (ref 98–111)
Creatinine, Ser: 1.4 mg/dL — ABNORMAL HIGH (ref 0.61–1.24)
Glucose, Bld: 104 mg/dL — ABNORMAL HIGH (ref 70–99)
HCT: 35 % — ABNORMAL LOW (ref 39.0–52.0)
Hemoglobin: 11.9 g/dL — ABNORMAL LOW (ref 13.0–17.0)
Potassium: 4 mmol/L (ref 3.5–5.1)
Sodium: 139 mmol/L (ref 135–145)
TCO2: 21 mmol/L — ABNORMAL LOW (ref 22–32)

## 2022-03-25 SURGERY — CARDIOVERSION
Anesthesia: General

## 2022-03-25 MED ORDER — PROPOFOL 10 MG/ML IV BOLUS
INTRAVENOUS | Status: DC | PRN
  Administered 2022-03-25: 70 mg via INTRAVENOUS

## 2022-03-25 MED ORDER — LIDOCAINE 2% (20 MG/ML) 5 ML SYRINGE
INTRAMUSCULAR | Status: DC | PRN
  Administered 2022-03-25: 80 mg via INTRAVENOUS

## 2022-03-25 MED ORDER — SODIUM CHLORIDE 0.9 % IV SOLN: INTRAVENOUS | Status: DC

## 2022-03-25 NOTE — Anesthesia Procedure Notes (Signed)
Procedure Name: General with mask airway Date/Time: 03/25/2022 9:10 AM  Performed by: Maxine Glenn, CRNAPre-anesthesia Checklist: Emergency Drugs available, Patient identified, Suction available and Patient being monitored Patient Re-evaluated:Patient Re-evaluated prior to induction Oxygen Delivery Method: Ambu bag Preoxygenation: Pre-oxygenation with 100% oxygen Induction Type: IV induction Dental Injury: Teeth and Oropharynx as per pre-operative assessment

## 2022-03-25 NOTE — Anesthesia Postprocedure Evaluation (Signed)
Anesthesia Post Note  Patient: Mario Fisher  Procedure(s) Performed: CARDIOVERSION     Patient location during evaluation: Endoscopy Anesthesia Type: General Level of consciousness: awake and alert Pain management: pain level controlled Vital Signs Assessment: post-procedure vital signs reviewed and stable Respiratory status: spontaneous breathing, nonlabored ventilation, respiratory function stable and patient connected to nasal cannula oxygen Cardiovascular status: blood pressure returned to baseline and stable Postop Assessment: no apparent nausea or vomiting Anesthetic complications: no   No notable events documented.  Last Vitals:  Vitals:   03/25/22 0930 03/25/22 0940  BP: (!) 134/97 (!) 150/97  Pulse: 64 70  Resp: 15 15  Temp:    SpO2: 98% 98%    Last Pain:  Vitals:   03/25/22 0940  TempSrc:   PainSc: 0-No pain                 Earl Lites P Kawon Willcutt

## 2022-03-25 NOTE — Transfer of Care (Signed)
Immediate Anesthesia Transfer of Care Note  Patient: Mario Fisher  Procedure(s) Performed: CARDIOVERSION  Patient Location: Endoscopy Unit  Anesthesia Type:General  Level of Consciousness: awake and drowsy  Airway & Oxygen Therapy: Patient Spontanous Breathing  Post-op Assessment: Report given to RN and Post -op Vital signs reviewed and stable  Post vital signs: Reviewed and stable  Last Vitals:  Vitals Value Taken Time  BP 133/94 (106)   Temp    Pulse 64   Resp 14   SpO2 92     Last Pain:  Vitals:   03/25/22 0835  TempSrc: Temporal  PainSc: 0-No pain         Complications: No notable events documented.

## 2022-03-25 NOTE — Discharge Instructions (Signed)
Electrical Cardioversion  Electrical cardioversion is the delivery of a jolt of electricity to restore a normal rhythm to the heart. A rhythm that is too fast or is not regular keeps the heart from pumping well. In this procedure, sticky patches or metal paddles are placed on the chest to deliver electricity to the heart from a device.  What can I expect after the procedure?  Your blood pressure, heart rate, breathing rate, and blood oxygen level will be monitored until you leave the hospital or clinic.  Your heart rhythm will be watched to make sure it does not change.  You may have some redness on the skin where the shocks were given.If this occurs, can use hydrocortisone cream or Aloe vera.  Follow these instructions at home:  Do not drive for 24 hours if you were given a sedative during your procedure.  Take over-the-counter and prescription medicines only as told by your health care provider.  Ask your health care provider how to check your pulse. Check it often.  Rest for 48 hours after the procedure or as told by your health care provider.  Avoid or limit your caffeine use as told by your health care provider.  Keep all follow-up visits as told by your health care provider. This is important.  Contact a health care provider if:  You feel like your heart is beating too quickly or your pulse is not regular.  You have a serious muscle cramp that does not go away.  Get help right away if:  You have discomfort in your chest.  You are dizzy or you feel faint.  You have trouble breathing or you are short of breath.  Your speech is slurred.  You have trouble moving an arm or leg on one side of your body.  Your fingers or toes turn cold or blue.  Summary  Electrical cardioversion is the delivery of a jolt of electricity to restore a normal rhythm to the heart.  This procedure may be done right away in an emergency or may be a scheduled procedure if the condition is not  an emergency.  Generally, this is a safe procedure.  After the procedure, check your pulse often as told by your health care provider.  This information is not intended to replace advice given to you by your health care provider. Make sure you discuss any questions you have with your health care provider. Document Revised: 03/21/2019 Document Reviewed: 03/21/2019 Elsevier Patient Education  2021 Elsevier Inc.  

## 2022-03-25 NOTE — Interval H&P Note (Signed)
History and Physical Interval Note:  03/25/2022 8:20 AM  Mario Fisher  has presented today for surgery, with the diagnosis of AFIB.  The various methods of treatment have been discussed with the patient and family. After consideration of risks, benefits and other options for treatment, the patient has consented to  Procedure(s): CARDIOVERSION (N/A) as a surgical intervention.  The patient's history has been reviewed, patient examined, no change in status, stable for surgery.  I have reviewed the patient's chart and labs.  Questions were answered to the patient's satisfaction.     Mario Fisher

## 2022-03-25 NOTE — CV Procedure (Signed)
   Electrical Cardioversion Procedure Note LIVIO LEDWITH [id number] 01/10/1934  Procedure: Electrical Cardioversion Indications:  Atrial Fibrillation  Time Out: Verified patient identification, verified procedure,medications/allergies/relevent history reviewed, required imaging and test results available.  Performed  Procedure Details  The patient signed informed consent.   The patient was NPO past midnight. Has had therapeutic anticoagulation with Xeralto greater than 3 weeks. The patient denies any interruption of anticoagulation.  Anesthesia was administered by the anesthesia team.  Adequate airway was maintained throughout and vital followed per protocol.  He was cardioverted x 1 with 200 J of biphasic synchronized energy.  He converted to NSR.  There were no apparent complications.  The patient tolerated the procedure well and had normal neuro status and respiratory status post procedure with vitals stable as recorded elsewhere.     IMPRESSION:  Successful cardioversion of atrial fibrillation to AV paced rhythm.   Follow up:  We will arrange follow up with Primary cardiologist.  He will continue on current medical therapy.  The patient advised to continue anticoagulation.  Lorey Pallett 03/25/2022, 9:34 AM

## 2022-03-25 NOTE — Anesthesia Preprocedure Evaluation (Addendum)
Anesthesia Evaluation  Patient identified by MRN, date of birth, ID band Patient awake    Reviewed: Allergy & Precautions, NPO status , Patient's Chart, lab work & pertinent test results  Airway Mallampati: II  TM Distance: >3 FB Neck ROM: Full    Dental no notable dental hx.    Pulmonary sleep apnea and Continuous Positive Airway Pressure Ventilation , former smoker,    Pulmonary exam normal        Cardiovascular hypertension, Pt. on medications + dysrhythmias (on Xarelto) Atrial Fibrillation + pacemaker  Rhythm:Irregular Rate:Normal     Neuro/Psych CVA, No Residual Symptoms negative psych ROS   GI/Hepatic Neg liver ROS, hiatal hernia, GERD  Medicated,  Endo/Other  negative endocrine ROS  Renal/GU   negative genitourinary   Musculoskeletal  (+) Arthritis , Rheumatoid disorders,    Abdominal Normal abdominal exam  (+)   Peds  Hematology  (+) Blood dyscrasia, anemia ,   Anesthesia Other Findings   Reproductive/Obstetrics                            Anesthesia Physical Anesthesia Plan  ASA: 3  Anesthesia Plan: General   Post-op Pain Management:    Induction: Intravenous  PONV Risk Score and Plan: 2 and Propofol infusion and Treatment may vary due to age or medical condition  Airway Management Planned: Mask  Additional Equipment: None  Intra-op Plan:   Post-operative Plan:   Informed Consent: I have reviewed the patients History and Physical, chart, labs and discussed the procedure including the risks, benefits and alternatives for the proposed anesthesia with the patient or authorized representative who has indicated his/her understanding and acceptance.     Dental advisory given  Plan Discussed with: CRNA  Anesthesia Plan Comments: (Lab Results      Component                Value               Date                      WBC                      4.0                  03/12/2022                HGB                      11.1 (L)            03/12/2022                HCT                      32.2 (L)            03/12/2022                MCV                      93.1                03/12/2022                PLT  184                 03/12/2022           Lab Results      Component                Value               Date                      NA                       138                 03/12/2022                K                        3.9                 03/12/2022                CO2                      23                  03/12/2022                GLUCOSE                  140 (H)             03/12/2022                BUN                      16                  03/12/2022                CREATININE               1.43 (H)            03/12/2022                CALCIUM                  9.5                 03/12/2022                GFRNONAA                 48 (L)              03/12/2022            ECHO 03/23: 1. Left ventricular ejection fraction, by estimation, is 50 to 55%. The  left ventricle has low normal function. The left ventricle has no regional  wall motion abnormalities. Left ventricular diastolic parameters are  indeterminate.  2. Right ventricular systolic function is normal. The right ventricular  size is normal. There is mildly elevated pulmonary artery systolic  pressure. The estimated right ventricular systolic pressure is 42.7 mmHg.  3. Left atrial size was moderately dilated.  4. Right atrial size was mildly dilated.  5. The mitral valve is normal in structure. Trivial mitral valve  regurgitation. No evidence of mitral stenosis.  6. The aortic valve is tricuspid. There is mild calcification of the  aortic valve. Aortic valve regurgitation is not visualized. Aortic valve  sclerosis/calcification is present, without any evidence of aortic  stenosis.  7. The inferior vena cava is dilated in size with <50% respiratory   variability, suggesting right atrial pressure of 15 mmHg.  8. Negative bubble study, no PFO or ASD.  9. The patient was in atrial fibrillation. )       Anesthesia Quick Evaluation

## 2022-03-26 ENCOUNTER — Encounter (HOSPITAL_COMMUNITY): Payer: Self-pay | Admitting: Cardiology

## 2022-04-01 ENCOUNTER — Encounter (HOSPITAL_COMMUNITY): Payer: Self-pay | Admitting: Physician Assistant

## 2022-04-01 ENCOUNTER — Ambulatory Visit (HOSPITAL_COMMUNITY)
Admission: RE | Admit: 2022-04-01 | Discharge: 2022-04-01 | Disposition: A | Discharge status: Home / Self Care | Payer: Medicare Other | Source: Ambulatory Visit | Attending: Physician Assistant | Admitting: Physician Assistant

## 2022-04-01 VITALS — BP 136/70 | HR 61 | Ht 60.0 in | Wt 161.2 lb

## 2022-04-01 DIAGNOSIS — I4819 Other persistent atrial fibrillation: Secondary | ICD-10-CM | POA: Diagnosis not present

## 2022-04-01 DIAGNOSIS — G4733 Obstructive sleep apnea (adult) (pediatric): Secondary | ICD-10-CM | POA: Insufficient documentation

## 2022-04-01 DIAGNOSIS — L03119 Cellulitis of unspecified part of limb: Secondary | ICD-10-CM | POA: Diagnosis not present

## 2022-04-01 DIAGNOSIS — I10 Essential (primary) hypertension: Secondary | ICD-10-CM | POA: Insufficient documentation

## 2022-04-01 DIAGNOSIS — D6869 Other thrombophilia: Secondary | ICD-10-CM | POA: Diagnosis not present

## 2022-04-01 DIAGNOSIS — Z6831 Body mass index (BMI) 31.0-31.9, adult: Secondary | ICD-10-CM | POA: Diagnosis not present

## 2022-04-01 DIAGNOSIS — E669 Obesity, unspecified: Secondary | ICD-10-CM | POA: Insufficient documentation

## 2022-04-01 LAB — BASIC METABOLIC PANEL
Anion gap: 9 (ref 5–15)
BUN: 19 mg/dL (ref 8–23)
CO2: 21 mmol/L — ABNORMAL LOW (ref 22–32)
Calcium: 9.2 mg/dL (ref 8.9–10.3)
Chloride: 106 mmol/L (ref 98–111)
Creatinine, Ser: 1.47 mg/dL — ABNORMAL HIGH (ref 0.61–1.24)
GFR, Estimated: 46 mL/min — ABNORMAL LOW (ref >60)
Glucose, Bld: 111 mg/dL — ABNORMAL HIGH (ref 70–99)
Potassium: 3.5 mmol/L (ref 3.5–5.1)
Sodium: 136 mmol/L (ref 135–145)

## 2022-04-01 NOTE — Progress Notes (Signed)
Primary Care Physician: Soundra Pilon, FNP Primary Cardiologist: Dr Elease Hashimoto Primary Electrophysiologist: Dr Johney Frame Referring Physician: Dr Maryella Shivers is a 88 y.o. male with a history of SSS s/p PPM, HTN, HLD, ankylosing spondylolysis, RA, CKD, OSA, prior CVA, atrial fibrillation who presents for follow up in the [facility]. Patient is on Xarelto for a CHADS2VASC score of 6. The device clinic received an alert for ongoing afib starting 02/27/22. There were no specific triggers that he could identify.   On follow up today, patient is s/p DCCV on 03/25/22. He does report some improvement with his SOB with SR. He denies any bleeding issues on anticoagulation. Of note, he does appear to have a small wound on his R first toe with a large area of swelling and redness surrounding it. This started two days ago and has gotten worse. He plans to go to his PCP walk-in clinic after his visit today.    Today, he denies symptoms of palpitations, chest pain, orthopnea, PND, dizziness, presyncope, syncope, snoring, daytime somnolence, bleeding, or neurologic sequela. The patient is tolerating medications without difficulties and is otherwise without complaint today.    Atrial Fibrillation Risk Factors:  he does have symptoms or diagnosis of sleep apnea. he is compliant with CPAP therapy. he does not have a history of rheumatic fever.   he has a BMI of Body mass index is 31.48 kg/m.Marland Kitchen Filed Weights   04/01/22 1433  Weight: 73.1 kg    Family History  Problem Relation Age of Onset   Arrhythmia Mother    Heart attack Father        x2   Hypertension Father    Stroke Father    Heart disease Sister    Hypertension Brother      Atrial Fibrillation Management history:  Previous antiarrhythmic drugs: none Previous cardioversions: 2018, 2019, 03/25/22 Previous ablations: none CHADS2VASC score: 6 Anticoagulation history: Xarelto    Past Medical History:   Diagnosis Date   Arthritis    "back" (04/08/2017)   Atrial fibrillation (HCC)    B12 deficiency anemia    BPH (benign prostatic hypertrophy)    Chronic anticoagulation    on Pradaxa, now on xarelto not pradaxa   Chronic lower back pain    Glaucoma, both eyes    Glucose intolerance (impaired glucose tolerance)    History of blood transfusion    "I'm thinking they gave me blood when I had the gallbladder OR" (04/08/2017)   History of gout    History of hiatal hernia    Hyperlipidemia    Hypertension    OSA on CPAP    Presence of permanent cardiac pacemaker    Rheumatoid arthritis (HCC)    Stroke (HCC)    remote CVA noted on MRI; "silent stroke; didn't know when it happened" (04/08/2017)   Past Surgical History:  Procedure Laterality Date   CARDIOVERSION  04/08/2017   Procedure: Cardioversion;  Surgeon: Hillis Range, MD;  [facility] IN[facility];  Service: Cardiovascular;;   CARDIOVERSION N/A 07/27/2018   Procedure: CARDIOVERSION;  Surgeon: Lewayne Bunting, MD;  Location: [facility] ENDOSCOPY;  Service: Cardiovascular;  Laterality: N/A;   CARDIOVERSION N/A 03/25/2022   Procedure: CARDIOVERSION;  Surgeon: Thomasene Ripple, DO;  Location: [facility] ENDOSCOPY;  Service: Cardiovascular;  Laterality: N/A;   CATARACT EXTRACTION W/ INTRAOCULAR LENS  IMPLANT, BILATERAL Bilateral    CHOLECYSTECTOMY OPEN     PACEMAKER IMPLANT N/A 04/08/2017   Medtronic Azure XT  dual-chamber MRI conditional pacemaker for symptomatic bradycardia by Dr Johney Frame   US ECHOCARDIOGRAPHY  02/25/2005   ef 55-60%    Current Outpatient Medications  Medication Sig Dispense Refill   allopurinol (ZYLOPRIM) 100 MG tablet Take 100 mg by mouth daily.     aspirin EC 81 MG tablet Take 81 mg by mouth daily. Swallow whole.     cyanocobalamin (,VITAMIN B-12,) 1000 MCG/ML injection Inject 1,000 mcg into the muscle every 30 (thirty) days.     dorzolamide (TRUSOPT) 2 % ophthalmic solution Place 1 drop into both eyes 2 (two) times daily.  12    fluticasone (FLONASE) 50 MCG/ACT nasal spray Place 1-2 sprays into both nostrils daily as needed for allergies or rhinitis.     Glucosamine-Chondroit-Vit C-Mn (GLUCOSAMINE CHONDR 1500 COMPLX PO) Take 1 tablet by mouth daily.     hydrochlorothiazide (MICROZIDE) 12.5 MG capsule Take 12.5 mg by mouth daily as needed (swelling).     Multiple Vitamins-Minerals (MULTIVITAMIN ADULTS 50+ PO) Take 1 tablet by mouth daily.     Omega-3 Fatty Acids (FISH OIL) 1200 MG CAPS Take 1,200-2,400 mg by mouth See admin instructions. Take 2400 mg by mouth in the morning and take 1200 mg by mouth at bedtime     pantoprazole (PROTONIX) 40 MG tablet Take 1 tablet by mouth daily. 30 tablet 1   Propylene Glycol (SYSTANE BALANCE) 0.6 % SOLN Place 1 drop into both eyes in the morning and at bedtime.     Rivaroxaban (XARELTO) 15 MG TABS tablet TAKE 1 TABLET BY MOUTH ONCE DAILY WITH SUPPER 90 tablet 1   tamsulosin (FLOMAX) 0.4 MG CAPS capsule Take 0.4 mg by mouth daily.     Upadacitinib ER (RINVOQ) 15 MG TB24 Take 15 mg by mouth daily.     valsartan (DIOVAN) 80 MG tablet Take 40-80 mg by mouth daily. Patient checks BP and if its low will only take 1/2 tab = 40mg      No current facility-administered medications for this encounter.    Allergies  Allergen Reactions   Statins Other (See Comments)    Joint pain   Upadacitinib     Other reaction(s): ?chills   Enbrel [Etanercept] Rash    Social History   Socioeconomic History   Marital status: Married    Spouse name: Not on file   Number of children: Not on file   Years of education: Not on file   Highest education level: Not on file  Occupational History   Not on file  Tobacco Use   Smoking status: Former    Types: Cigars    Quit date: 09/01/1968    Years since quitting: 53.6   Smokeless tobacco: Never   Tobacco comments:    Former smoker 03/12/22  Vaping Use   Vaping Use: Never used  Substance and Sexual Activity   Alcohol use: No   Drug use: No   Sexual  activity: Not Currently  Other Topics Concern   Not on file  Social History Narrative   Not on file   Social Determinants of Health   Financial Resource Strain: Not on file  Food Insecurity: Not on file  Transportation Needs: Not on file  Physical Activity: Not on file  Stress: Not on file  Social Connections: Not on file  Intimate Partner Violence: Not on file     ROS- All systems are reviewed and negative except as per the HPI above.  Physical Exam: Vitals:   04/01/22 1433  BP: 136/70  Pulse:  61  Weight: 73.1 kg  Height: 5' (1.524 m)     GEN- The patient is a well appearing elderly, obese male, alert and oriented x 3 today.   HEENT-head normocephalic, atraumatic, sclera clear, conjunctiva pink, hearing intact, trachea midline. Lungs- Clear to ausculation bilaterally, normal work of breathing Heart- Regular rate and rhythm, no murmurs, rubs or gallops  GI- soft, NT, ND, + BS Extremities- no clubbing, cyanosis, R first and second toe erythema and swelling, warm to touch MS- no significant deformity or atrophy Skin- no rash or lesion Psych- euthymic mood, full affect Neuro- strength and sensation are intact   Wt Readings from Last 3 Encounters:  04/01/22 73.1 kg  03/25/22 74.8 kg  03/12/22 74.9 kg    EKG today demonstrates  A paced rhythm with prolonged AV conduction, V paced beat Vent. rate 61 BPM PR interval 362 ms QRS duration 82 ms QT/QTcB 376/378 ms  Echo 11/04/21 demonstrated   1. Left ventricular ejection fraction, by estimation, is 50 to 55%. The  left ventricle has low normal function. The left ventricle has no regional  wall motion abnormalities. Left ventricular diastolic parameters are  indeterminate.   2. Right ventricular systolic function is normal. The right ventricular  size is normal. There is mildly elevated pulmonary artery systolic  pressure. The estimated right ventricular systolic pressure is 42.7 mmHg.   3. Left atrial size was  moderately dilated.   4. Right atrial size was mildly dilated.   5. The mitral valve is normal in structure. Trivial mitral valve  regurgitation. No evidence of mitral stenosis.   6. The aortic valve is tricuspid. There is mild calcification of the  aortic valve. Aortic valve regurgitation is not visualized. Aortic valve  sclerosis/calcification is present, without any evidence of aortic  stenosis.   7. The inferior vena cava is dilated in size with <50% respiratory  variability, suggesting right atrial pressure of 15 mmHg.   8. Negative bubble study, no PFO or ASD.   9. The patient was in atrial fibrillation.  Epic records are reviewed at length today  CHA2DS2-VASc Score = 6  The patient's score is based upon: CHF History: 0 HTN History: 1 Diabetes History: 0 Stroke History: 2 Vascular Disease History: 1 Age Score: 2 Gender Score: 0       ASSESSMENT AND PLAN: 1. Persistent Atrial Fibrillation (ICD10:  I48.19) The patient's CHA2DS2-VASc score is 6, indicating a 9.7% annual risk of stroke.   S/p DCCV 03/25/22 Patient back in A paced rhythm.  Continue Xarelto 15 mg daily  2. Secondary Hypercoagulable State (ICD10:  D68.69) The patient is at significant risk for stroke/thromboembolism based upon his CHA2DS2-VASc Score of 6.  Continue Rivaroxaban (Xarelto).   3. Obesity Body mass index is 31.48 kg/m. Lifestyle modification was discussed and encouraged including regular physical activity and weight reduction.  4. Obstructive sleep apnea Encouraged compliance with CPAP therapy.  5. Symptomatic bradycardia S/p PPM, followed by Dr Johney Frame and the device clinic.  6. HTN Stable, no changes today.   Follow up with Dr Elease Hashimoto as scheduled and with Francis Dowse per recall.    Jorja Loa PA-C Afib Clinic [facility] [address] [place], [place] [address] [phone] 04/01/2022 2:40 PM

## 2022-04-04 DIAGNOSIS — L03115 Cellulitis of right lower limb: Secondary | ICD-10-CM | POA: Diagnosis not present

## 2022-04-11 DIAGNOSIS — I129 Hypertensive chronic kidney disease with stage 1 through stage 4 chronic kidney disease, or unspecified chronic kidney disease: Secondary | ICD-10-CM | POA: Diagnosis not present

## 2022-04-11 DIAGNOSIS — N2581 Secondary hyperparathyroidism of renal origin: Secondary | ICD-10-CM | POA: Diagnosis not present

## 2022-04-11 DIAGNOSIS — L03115 Cellulitis of right lower limb: Secondary | ICD-10-CM | POA: Diagnosis not present

## 2022-04-11 DIAGNOSIS — N183 Chronic kidney disease, stage 3 unspecified: Secondary | ICD-10-CM | POA: Diagnosis not present

## 2022-04-11 DIAGNOSIS — L089 Local infection of the skin and subcutaneous tissue, unspecified: Secondary | ICD-10-CM | POA: Diagnosis not present

## 2022-04-11 DIAGNOSIS — D631 Anemia in chronic kidney disease: Secondary | ICD-10-CM | POA: Diagnosis not present

## 2022-04-18 DIAGNOSIS — L089 Local infection of the skin and subcutaneous tissue, unspecified: Secondary | ICD-10-CM | POA: Diagnosis not present

## 2022-04-28 ENCOUNTER — Telehealth: Payer: Self-pay

## 2022-04-28 NOTE — Telephone Encounter (Signed)
PPM Alert received by [employer] [facility]: Device alert for AF ongoing from 8/25 @ 21:35, controlled rates Burden 1.4%, Xarelto DCCV 7/25 Route to triage LA  Forwarding to Clint R. [name], PA in Afib clinic for further consideration. Patient recently had cardioversion on 03/25/22.

## 2022-05-02 NOTE — Telephone Encounter (Signed)
Left message

## 2022-05-06 NOTE — Telephone Encounter (Signed)
Appt made

## 2022-05-14 ENCOUNTER — Encounter (HOSPITAL_COMMUNITY): Payer: Self-pay | Admitting: Physician Assistant

## 2022-05-14 ENCOUNTER — Ambulatory Visit (HOSPITAL_COMMUNITY)
Admission: RE | Admit: 2022-05-14 | Discharge: 2022-05-14 | Disposition: A | Discharge status: Home / Self Care | Payer: Medicare Other | Source: Ambulatory Visit | Attending: Physician Assistant | Admitting: Physician Assistant

## 2022-05-14 VITALS — BP 152/90 | HR 80 | Ht 60.0 in | Wt 158.8 lb

## 2022-05-14 DIAGNOSIS — D6869 Other thrombophilia: Secondary | ICD-10-CM

## 2022-05-14 DIAGNOSIS — E785 Hyperlipidemia, unspecified: Secondary | ICD-10-CM | POA: Diagnosis not present

## 2022-05-14 DIAGNOSIS — M069 Rheumatoid arthritis, unspecified: Secondary | ICD-10-CM | POA: Insufficient documentation

## 2022-05-14 DIAGNOSIS — Z8673 Personal history of transient ischemic attack (TIA), and cerebral infarction without residual deficits: Secondary | ICD-10-CM | POA: Diagnosis not present

## 2022-05-14 DIAGNOSIS — Z6831 Body mass index (BMI) 31.0-31.9, adult: Secondary | ICD-10-CM | POA: Insufficient documentation

## 2022-05-14 DIAGNOSIS — Z950 Presence of cardiac pacemaker: Secondary | ICD-10-CM | POA: Diagnosis not present

## 2022-05-14 DIAGNOSIS — I495 Sick sinus syndrome: Secondary | ICD-10-CM | POA: Diagnosis not present

## 2022-05-14 DIAGNOSIS — G4733 Obstructive sleep apnea (adult) (pediatric): Secondary | ICD-10-CM | POA: Insufficient documentation

## 2022-05-14 DIAGNOSIS — E669 Obesity, unspecified: Secondary | ICD-10-CM | POA: Diagnosis not present

## 2022-05-14 DIAGNOSIS — N189 Chronic kidney disease, unspecified: Secondary | ICD-10-CM | POA: Diagnosis not present

## 2022-05-14 DIAGNOSIS — I4819 Other persistent atrial fibrillation: Secondary | ICD-10-CM | POA: Diagnosis not present

## 2022-05-14 DIAGNOSIS — I480 Paroxysmal atrial fibrillation: Secondary | ICD-10-CM

## 2022-05-14 DIAGNOSIS — I129 Hypertensive chronic kidney disease with stage 1 through stage 4 chronic kidney disease, or unspecified chronic kidney disease: Secondary | ICD-10-CM | POA: Insufficient documentation

## 2022-05-14 LAB — CBC
HCT: 36.5 % — ABNORMAL LOW (ref 39.0–52.0)
Hemoglobin: 12.4 g/dL — ABNORMAL LOW (ref 13.0–17.0)
MCH: 31.6 pg (ref 26.0–34.0)
MCHC: 34 g/dL (ref 30.0–36.0)
MCV: 92.9 fL (ref 80.0–100.0)
Platelets: 159 10*3/uL (ref 150–400)
RBC: 3.93 MIL/uL — ABNORMAL LOW (ref 4.22–5.81)
RDW: 13.6 % (ref 11.5–15.5)
WBC: 3.6 10*3/uL — ABNORMAL LOW (ref 4.0–10.5)
nRBC: 0 % (ref 0.0–0.2)

## 2022-05-14 NOTE — Progress Notes (Signed)
Primary Care Physician: Soundra Pilon, FNP Primary Cardiologist: Dr Elease Hashimoto Primary Electrophysiologist: Dr Johney Frame Referring Physician: Dr Maryella Shivers is a 88 y.o. male with a history of SSS s/p PPM, HTN, HLD, ankylosing spondylolysis, RA, CKD, OSA, prior CVA, atrial fibrillation who presents for follow up in the [facility]. Patient is on Xarelto for a CHADS2VASC score of 6. The device clinic received an alert for ongoing afib starting 02/27/22. There were no specific triggers that he could identify. Patient is s/p DCCV on 03/25/22.   On follow up today, the device clinic received an alert for an ongoing afib episode starting 04/25/22. He does feel a little more SOB with exertion when in afib. He also reports that about two weeks ago, he woke in the middle of the night with his shorts and bed "soaked" with blood. He has a h/o internal hemorrhoids but has never had bleeding like this. He self-stopped ASA and his bleeding resolved.    Today, he denies symptoms of palpitations, chest pain, orthopnea, PND, dizziness, presyncope, syncope, snoring, daytime somnolence, bleeding, or neurologic sequela. The patient is tolerating medications without difficulties and is otherwise without complaint today.    Atrial Fibrillation Risk Factors:  he does have symptoms or diagnosis of sleep apnea. he is compliant with CPAP therapy. he does not have a history of rheumatic fever.   he has a BMI of Body mass index is 31.01 kg/m.Marland Kitchen Filed Weights   05/14/22 1101  Weight: 72 kg    Family History  Problem Relation Age of Onset   Arrhythmia Mother    Heart attack Father        x2   Hypertension Father    Stroke Father    Heart disease Sister    Hypertension Brother      Atrial Fibrillation Management history:  Previous antiarrhythmic drugs: none Previous cardioversions: 2018, 2019, 03/25/22 Previous ablations: none CHADS2VASC score: 6 Anticoagulation  history: Xarelto    Past Medical History:  Diagnosis Date   Arthritis    "back" (04/08/2017)   Atrial fibrillation (HCC)    B12 deficiency anemia    BPH (benign prostatic hypertrophy)    Chronic anticoagulation    on Pradaxa, now on xarelto not pradaxa   Chronic lower back pain    Glaucoma, both eyes    Glucose intolerance (impaired glucose tolerance)    History of blood transfusion    "I'm thinking they gave me blood when I had the gallbladder OR" (04/08/2017)   History of gout    History of hiatal hernia    Hyperlipidemia    Hypertension    OSA on CPAP    Presence of permanent cardiac pacemaker    Rheumatoid arthritis (HCC)    Stroke (HCC)    remote CVA noted on MRI; "silent stroke; didn't know when it happened" (04/08/2017)   Past Surgical History:  Procedure Laterality Date   CARDIOVERSION  04/08/2017   Procedure: Cardioversion;  Surgeon: Hillis Range, MD;  Location: [facility] INVASIVE CV LAB;  Service: Cardiovascular;;   CARDIOVERSION N/A 07/27/2018   Procedure: CARDIOVERSION;  Surgeon: Lewayne Bunting, MD;  Location: [facility] ENDOSCOPY;  Service: Cardiovascular;  Laterality: N/A;   CARDIOVERSION N/A 03/25/2022   Procedure: CARDIOVERSION;  Surgeon: Thomasene Ripple, DO;  Location: [facility] ENDOSCOPY;  Service: Cardiovascular;  Laterality: N/A;   CATARACT EXTRACTION W/ INTRAOCULAR LENS  IMPLANT, BILATERAL Bilateral    CHOLECYSTECTOMY OPEN     PACEMAKER IMPLANT N/A 04/08/2017  Medtronic Azure XT dual-chamber MRI conditional pacemaker for symptomatic bradycardia by Dr Johney Frame   US ECHOCARDIOGRAPHY  02/25/2005   ef 55-60%    Current Outpatient Medications  Medication Sig Dispense Refill   allopurinol (ZYLOPRIM) 100 MG tablet Take 100 mg by mouth daily.     cyanocobalamin (,VITAMIN B-12,) 1000 MCG/ML injection Inject 1,000 mcg into the muscle every 30 (thirty) days.     dorzolamide (TRUSOPT) 2 % ophthalmic solution Place 1 drop into both eyes 2 (two) times daily.  12   fluticasone (FLONASE) 50  MCG/ACT nasal spray Place 1-2 sprays into both nostrils daily as needed for allergies or rhinitis.     Glucosamine-Chondroit-Vit C-Mn (GLUCOSAMINE CHONDR 1500 COMPLX PO) Take 1 tablet by mouth daily.     hydrochlorothiazide (MICROZIDE) 12.5 MG capsule Take 12.5 mg by mouth daily as needed (swelling).     Multiple Vitamins-Minerals (MULTIVITAMIN ADULTS 50+ PO) Take 1 tablet by mouth daily.     Omega-3 Fatty Acids (FISH OIL) 1200 MG CAPS Take 1,200-2,400 mg by mouth See admin instructions. Take 2400 mg by mouth in the morning and take 1200 mg by mouth at bedtime     Propylene Glycol (SYSTANE BALANCE) 0.6 % SOLN Place 1 drop into both eyes in the morning and at bedtime.     Rivaroxaban (XARELTO) 15 MG TABS tablet TAKE 1 TABLET BY MOUTH ONCE DAILY WITH SUPPER 90 tablet 1   tamsulosin (FLOMAX) 0.4 MG CAPS capsule Take 0.4 mg by mouth daily.     Upadacitinib ER (RINVOQ) 15 MG TB24 Take 15 mg by mouth daily.     valsartan (DIOVAN) 80 MG tablet Take 40-80 mg by mouth daily. Patient checks BP and if its low will only take 1/2 tab = 40mg      No current facility-administered medications for this encounter.    Allergies  Allergen Reactions   Statins Other (See Comments)    Joint pain   Upadacitinib     Other reaction(s): ?chills   Enbrel [Etanercept] Rash    Social History   Socioeconomic History   Marital status: Married    Spouse name: Not on file   Number of children: Not on file   Years of education: Not on file   Highest education level: Not on file  Occupational History   Not on file  Tobacco Use   Smoking status: Former    Types: Cigars    Quit date: 09/01/1968    Years since quitting: 53.7   Smokeless tobacco: Never   Tobacco comments:    Former smoker 03/12/22  Vaping Use   Vaping Use: Never used  Substance and Sexual Activity   Alcohol use: No   Drug use: No   Sexual activity: Not Currently  Other Topics Concern   Not on file  Social History Narrative   Not on file    Social Determinants of Health   Financial Resource Strain: Not on file  Food Insecurity: Not on file  Transportation Needs: Not on file  Physical Activity: Not on file  Stress: Not on file  Social Connections: Not on file  Intimate Partner Violence: Not on file     ROS- All systems are reviewed and negative except as per the HPI above.  Physical Exam: Vitals:   05/14/22 1101  BP: (!) 152/90  Pulse: 80  Weight: 72 kg  Height: 5' (1.524 m)     GEN- The patient is a well appearing obese elderly male, alert and oriented x 3  today.   HEENT-head normocephalic, atraumatic, sclera clear, conjunctiva pink, hearing intact, trachea midline. Lungs- Clear to ausculation bilaterally, normal work of breathing Heart- Regular rate and rhythm, no murmurs, rubs or gallops  GI- soft, NT, ND, + BS Extremities- no clubbing, cyanosis, or edema MS- no significant deformity or atrophy Skin- no rash or lesion Psych- euthymic mood, full affect Neuro- strength and sensation are intact   Wt Readings from Last 3 Encounters:  05/14/22 72 kg  04/01/22 73.1 kg  03/25/22 74.8 kg    EKG today demonstrates  V pacing with underlying afib Vent. rate 80 BPM PR interval * ms QRS duration 168 ms QT/QTcB 432/498 ms  Echo 11/04/21 demonstrated   1. Left ventricular ejection fraction, by estimation, is 50 to 55%. The  left ventricle has low normal function. The left ventricle has no regional  wall motion abnormalities. Left ventricular diastolic parameters are  indeterminate.   2. Right ventricular systolic function is normal. The right ventricular  size is normal. There is mildly elevated pulmonary artery systolic  pressure. The estimated right ventricular systolic pressure is 42.7 mmHg.   3. Left atrial size was moderately dilated.   4. Right atrial size was mildly dilated.   5. The mitral valve is normal in structure. Trivial mitral valve  regurgitation. No evidence of mitral stenosis.   6. The  aortic valve is tricuspid. There is mild calcification of the  aortic valve. Aortic valve regurgitation is not visualized. Aortic valve  sclerosis/calcification is present, without any evidence of aortic  stenosis.   7. The inferior vena cava is dilated in size with <50% respiratory  variability, suggesting right atrial pressure of 15 mmHg.   8. Negative bubble study, no PFO or ASD.   9. The patient was in atrial fibrillation.  Epic records are reviewed at length today  CHA2DS2-VASc Score = 6  The patient's score is based upon: CHF History: 0 HTN History: 1 Diabetes History: 0 Stroke History: 2 Vascular Disease History: 1 Age Score: 2 Gender Score: 0       ASSESSMENT AND PLAN: 1. Persistent Atrial Fibrillation (ICD10:  I48.19) The patient's CHA2DS2-VASc score is 6, indicating a 9.7% annual risk of stroke.   Had DCCV 03/25/22, back in afib 04/25/22 We discussed rhythm control options today including AAD + DCCV. He would not be able to hold anticoagulation for 30 days if sinus rhythm is pursued. Recommend he reach out to his GI doctor (Dr [name] Schlein, [facility]) given his significant bleeding episode before restoring SR. Will check cbc today.  Continue Xarelto 15 mg daily  2. Secondary Hypercoagulable State (ICD10:  D68.69) The patient is at significant risk for stroke/thromboembolism based upon his CHA2DS2-VASc Score of 6.  Continue Rivaroxaban (Xarelto).   3. Obesity Body mass index is 31.01 kg/m. Lifestyle modification was discussed and encouraged including regular physical activity and weight reduction.  4. Obstructive sleep apnea Encouraged compliance with CPAP therapy.  5. Symptomatic bradycardia S/p PPM, followed by the device clinic.  6. HTN Stable, no changes today.   Follow up in the AF clinic in one month, sooner pending GI evaluation.    Jorja Loa PA-C Afib Clinic [facility] [address] [place], [place] [address] [phone] 05/14/2022 11:16  AM

## 2022-05-18 ENCOUNTER — Encounter: Payer: Self-pay | Admitting: Cardiovascular Disease
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
## 2022-05-19 ENCOUNTER — Encounter: Payer: Self-pay | Admitting: Cardiovascular Disease

## 2022-05-19 ENCOUNTER — Ambulatory Visit: Payer: Medicare Other | Attending: Cardiovascular Disease | Admitting: Cardiovascular Disease

## 2022-05-19 ENCOUNTER — Ambulatory Visit (INDEPENDENT_AMBULATORY_CARE_PROVIDER_SITE_OTHER): Payer: Medicare Other

## 2022-05-19 VITALS — BP 132/80 | HR 84 | Wt 162.2 lb

## 2022-05-19 DIAGNOSIS — I4819 Other persistent atrial fibrillation: Secondary | ICD-10-CM

## 2022-05-19 DIAGNOSIS — I10 Essential (primary) hypertension: Secondary | ICD-10-CM

## 2022-05-19 DIAGNOSIS — I495 Sick sinus syndrome: Secondary | ICD-10-CM

## 2022-05-19 DIAGNOSIS — E785 Hyperlipidemia, unspecified: Secondary | ICD-10-CM | POA: Insufficient documentation

## 2022-05-19 DIAGNOSIS — E782 Mixed hyperlipidemia: Secondary | ICD-10-CM

## 2022-05-19 DIAGNOSIS — I480 Paroxysmal atrial fibrillation: Secondary | ICD-10-CM | POA: Diagnosis not present

## 2022-05-19 NOTE — Patient Instructions (Signed)
Medication Instructions:  Your physician recommends that you continue on your current medications as directed. Please refer to the Current Medication list given to you today.  *If you need a refill on your cardiac medications before your next appointment, please call your pharmacy*  Lab Work: NONE If you have labs (blood work) drawn today and your tests are completely normal, you will receive your results only by: MyChart Message (if you have MyChart) OR A paper copy in the mail If you have any lab test that is abnormal or we need to change your treatment, we will call you to review the results.  Testing/Procedures: NONE  Follow-Up: At [facility], you and your health needs are our priority.  As part of our continuing mission to provide you with exceptional heart care, we have created designated Provider Care Teams.  These Care Teams include your primary Cardiologist (physician) and Advanced Practice Providers (APPs -  Physician Assistants and Nurse Practitioners) who all work together to provide you with the care you need, when you need it.  Your next appointment:   6 month(s)  The format for your next appointment:   In Person  Provider:   Philip Nahser, MD    Important Information About Sugar       

## 2022-05-21 LAB — CUP PACEART REMOTE DEVICE CHECK
Battery Remaining Longevity: 63 mo
Battery Voltage: 2.96 V
Brady Statistic RA Percent Paced: 4.1 %
Brady Statistic RV Percent Paced: 99.31 %
Date Time Interrogation Session: 20230918014842
Implantable Lead Implant Date: 20180808
Implantable Lead Implant Date: 20180808
Implantable Lead Location: 753859
Implantable Lead Location: 753860
Implantable Lead Model: 5076
Implantable Lead Model: 5076
Implantable Pulse Generator Implant Date: 20180808
Lead Channel Impedance Value: 266 Ohm
Lead Channel Impedance Value: 323 Ohm
Lead Channel Impedance Value: 342 Ohm
Lead Channel Impedance Value: 437 Ohm
Lead Channel Pacing Threshold Amplitude: 0.875 V
Lead Channel Pacing Threshold Amplitude: 1 V
Lead Channel Pacing Threshold Pulse Width: 0.4 ms
Lead Channel Pacing Threshold Pulse Width: 0.4 ms
Lead Channel Sensing Intrinsic Amplitude: 1.125 mV
Lead Channel Sensing Intrinsic Amplitude: 1.125 mV
Lead Channel Sensing Intrinsic Amplitude: 12.5 mV
Lead Channel Sensing Intrinsic Amplitude: 12.5 mV
Lead Channel Setting Pacing Amplitude: 2 V
Lead Channel Setting Pacing Amplitude: 2 V
Lead Channel Setting Pacing Pulse Width: 0.4 ms
Lead Channel Setting Sensing Sensitivity: 0.9 mV

## 2022-05-29 DIAGNOSIS — H04123 Dry eye syndrome of bilateral lacrimal glands: Secondary | ICD-10-CM | POA: Diagnosis not present

## 2022-05-29 DIAGNOSIS — H401132 Primary open-angle glaucoma, bilateral, moderate stage: Secondary | ICD-10-CM | POA: Diagnosis not present

## 2022-06-02 NOTE — Progress Notes (Signed)
Remote pacemaker transmission.   

## 2022-06-10 DIAGNOSIS — Z86008 Personal history of in-situ neoplasm of other site: Secondary | ICD-10-CM | POA: Diagnosis not present

## 2022-06-10 DIAGNOSIS — L821 Other seborrheic keratosis: Secondary | ICD-10-CM | POA: Diagnosis not present

## 2022-06-10 DIAGNOSIS — Z129 Encounter for screening for malignant neoplasm, site unspecified: Secondary | ICD-10-CM | POA: Diagnosis not present

## 2022-06-10 DIAGNOSIS — L570 Actinic keratosis: Secondary | ICD-10-CM | POA: Diagnosis not present

## 2022-06-16 ENCOUNTER — Encounter (HOSPITAL_COMMUNITY): Payer: Self-pay | Admitting: Physician Assistant

## 2022-06-16 ENCOUNTER — Ambulatory Visit (HOSPITAL_COMMUNITY)
Admission: RE | Admit: 2022-06-16 | Discharge: 2022-06-16 | Disposition: A | Discharge status: Home / Self Care | Payer: Medicare Other | Source: Ambulatory Visit | Attending: Physician Assistant | Admitting: Physician Assistant

## 2022-06-16 VITALS — BP 142/90 | HR 73 | Ht 60.0 in | Wt 162.0 lb

## 2022-06-16 DIAGNOSIS — M47899 Other spondylosis, site unspecified: Secondary | ICD-10-CM | POA: Insufficient documentation

## 2022-06-16 DIAGNOSIS — R9431 Abnormal electrocardiogram [ECG] [EKG]: Secondary | ICD-10-CM | POA: Insufficient documentation

## 2022-06-16 DIAGNOSIS — Z6831 Body mass index (BMI) 31.0-31.9, adult: Secondary | ICD-10-CM | POA: Insufficient documentation

## 2022-06-16 DIAGNOSIS — Z8673 Personal history of transient ischemic attack (TIA), and cerebral infarction without residual deficits: Secondary | ICD-10-CM | POA: Insufficient documentation

## 2022-06-16 DIAGNOSIS — I10 Essential (primary) hypertension: Secondary | ICD-10-CM | POA: Insufficient documentation

## 2022-06-16 DIAGNOSIS — Z79899 Other long term (current) drug therapy: Secondary | ICD-10-CM | POA: Insufficient documentation

## 2022-06-16 DIAGNOSIS — Z7902 Long term (current) use of antithrombotics/antiplatelets: Secondary | ICD-10-CM | POA: Diagnosis not present

## 2022-06-16 DIAGNOSIS — Z950 Presence of cardiac pacemaker: Secondary | ICD-10-CM | POA: Diagnosis not present

## 2022-06-16 DIAGNOSIS — G4733 Obstructive sleep apnea (adult) (pediatric): Secondary | ICD-10-CM | POA: Insufficient documentation

## 2022-06-16 DIAGNOSIS — E785 Hyperlipidemia, unspecified: Secondary | ICD-10-CM | POA: Diagnosis not present

## 2022-06-16 DIAGNOSIS — Z7951 Long term (current) use of inhaled steroids: Secondary | ICD-10-CM | POA: Diagnosis not present

## 2022-06-16 DIAGNOSIS — R001 Bradycardia, unspecified: Secondary | ICD-10-CM | POA: Diagnosis not present

## 2022-06-16 DIAGNOSIS — I4819 Other persistent atrial fibrillation: Secondary | ICD-10-CM | POA: Diagnosis not present

## 2022-06-16 DIAGNOSIS — Z713 Dietary counseling and surveillance: Secondary | ICD-10-CM | POA: Diagnosis not present

## 2022-06-16 DIAGNOSIS — M069 Rheumatoid arthritis, unspecified: Secondary | ICD-10-CM | POA: Insufficient documentation

## 2022-06-16 DIAGNOSIS — E669 Obesity, unspecified: Secondary | ICD-10-CM | POA: Insufficient documentation

## 2022-06-16 DIAGNOSIS — D6869 Other thrombophilia: Secondary | ICD-10-CM | POA: Diagnosis not present

## 2022-06-16 NOTE — Progress Notes (Signed)
Primary Care Physician: Soundra Pilon, FNP Primary Cardiologist: Dr Elease Hashimoto Primary Electrophysiologist: Dr Johney Frame Referring Physician: Dr Maryella Shivers is a 88 y.o. male with a history of SSS s/p PPM, HTN, HLD, ankylosing spondylolysis, RA, CKD, OSA, prior CVA, atrial fibrillation who presents for follow up in the [facility]. Patient is on Xarelto for a CHADS2VASC score of 6. The device clinic received an alert for ongoing afib starting 02/27/22. There were no specific triggers that he could identify. Patient is s/p DCCV on 03/25/22.   The device clinic received an alert for an ongoing afib episode starting 04/25/22. He does feel a little more SOB with exertion when in afib. He also reports he woke in the middle of the night with his shorts and bed "soaked" with blood. He has a h/o internal hemorrhoids but has never had bleeding like that. He self-stopped ASA and his bleeding resolved.   On follow up today, patient reports that he has felt well since his last visit. He denies any other bleeding episodes. He reports that he really does not notice his afib. He does have some dyspnea with exertion but this was unchanged in SR.    Today, he denies symptoms of palpitations, chest pain, orthopnea, PND, dizziness, presyncope, syncope, snoring, daytime somnolence, bleeding, or neurologic sequela. The patient is tolerating medications without difficulties and is otherwise without complaint today.    Atrial Fibrillation Risk Factors:  he does have symptoms or diagnosis of sleep apnea. he is compliant with CPAP therapy. he does not have a history of rheumatic fever.   he has a BMI of Body mass index is 31.64 kg/m.Marland Kitchen Filed Weights   06/16/22 1044  Weight: 73.5 kg    Family History  Problem Relation Age of Onset   Arrhythmia Mother    Heart attack Father        x2   Hypertension Father    Stroke Father    Heart disease Sister    Hypertension Brother       Atrial Fibrillation Management history:  Previous antiarrhythmic drugs: none Previous cardioversions: 2018, 2019, 03/25/22 Previous ablations: none CHADS2VASC score: 6 Anticoagulation history: Xarelto    Past Medical History:  Diagnosis Date   Arthritis    "back" (04/08/2017)   Atrial fibrillation (HCC)    B12 deficiency anemia    BPH (benign prostatic hypertrophy)    Chronic anticoagulation    on Pradaxa, now on xarelto not pradaxa   Chronic lower back pain    Glaucoma, both eyes    Glucose intolerance (impaired glucose tolerance)    History of blood transfusion    "I'm thinking they gave me blood when I had the gallbladder OR" (04/08/2017)   History of gout    History of hiatal hernia    Hyperlipidemia    Hypertension    OSA on CPAP    Presence of permanent cardiac pacemaker    Rheumatoid arthritis (HCC)    Stroke (HCC)    remote CVA noted on MRI; "silent stroke; didn't know when it happened" (04/08/2017)   Past Surgical History:  Procedure Laterality Date   CARDIOVERSION  04/08/2017   Procedure: Cardioversion;  Surgeon: Hillis Range, MD;  Location: [facility];  Service: Cardiovascular;;   CARDIOVERSION N/A 07/27/2018   Procedure: CARDIOVERSION;  Surgeon: Lewayne Bunting, MD;  Location: [facility] ENDOSCOPY;  Service: Cardiovascular;  Laterality: N/A;   CARDIOVERSION N/A 03/25/2022   Procedure: CARDIOVERSION;  Surgeon:  Tobb, Godfrey Pick, DO;  Location: [facility] ENDOSCOPY;  Service: Cardiovascular;  Laterality: N/A;   CATARACT EXTRACTION W/ INTRAOCULAR LENS  IMPLANT, BILATERAL Bilateral    CHOLECYSTECTOMY OPEN     PACEMAKER IMPLANT N/A 04/08/2017   Medtronic Azure XT dual-chamber MRI conditional pacemaker for symptomatic bradycardia by Dr Rayann Heman   US ECHOCARDIOGRAPHY  02/25/2005   ef 55-60%    Current Outpatient Medications  Medication Sig Dispense Refill   allopurinol (ZYLOPRIM) 100 MG tablet Take 100 mg by mouth daily.     cyanocobalamin (,VITAMIN B-12,) 1000 MCG/ML  injection Inject 1,000 mcg into the muscle every 30 (thirty) days.     dorzolamide (TRUSOPT) 2 % ophthalmic solution Place 1 drop into both eyes 2 (two) times daily.  12   fluticasone (FLONASE) 50 MCG/ACT nasal spray Place 1-2 sprays into both nostrils daily as needed for allergies or rhinitis.     Glucosamine-Chondroit-Vit C-Mn (GLUCOSAMINE CHONDR 1500 COMPLX PO) Take 1 tablet by mouth daily.     hydrochlorothiazide (MICROZIDE) 12.5 MG capsule Take 12.5 mg by mouth daily as needed (swelling).     Multiple Vitamins-Minerals (MULTIVITAMIN ADULTS 50+ PO) Take 1 tablet by mouth daily.     Omega-3 Fatty Acids (FISH OIL) 1200 MG CAPS Take 1,200-2,400 mg by mouth See admin instructions. Take 2400 mg by mouth in the morning and take 1200 mg by mouth at bedtime     Propylene Glycol (SYSTANE BALANCE) 0.6 % SOLN Place 1 drop into both eyes in the morning and at bedtime.     Rivaroxaban (XARELTO) 15 MG TABS tablet TAKE 1 TABLET BY MOUTH ONCE DAILY WITH SUPPER 90 tablet 1   tamsulosin (FLOMAX) 0.4 MG CAPS capsule Take 0.4 mg by mouth daily.     Upadacitinib ER (RINVOQ) 15 MG TB24 Take 15 mg by mouth daily.     valsartan (DIOVAN) 80 MG tablet Take 40-80 mg by mouth daily. Patient checks BP and if its low will only take 1/2 tab = 40mg      No current facility-administered medications for this encounter.    Allergies  Allergen Reactions   Statins Other (See Comments)    Joint pain   Upadacitinib     Other reaction(s): ?chills   Enbrel [Etanercept] Rash    Social History   Socioeconomic History   Marital status: Married    Spouse name: Not on file   Number of children: Not on file   Years of education: Not on file   Highest education level: Not on file  Occupational History   Not on file  Tobacco Use   Smoking status: Former    Types: Cigars    Quit date: 09/01/1968    Years since quitting: 53.8   Smokeless tobacco: Never   Tobacco comments:    Former smoker 03/12/22  Vaping Use   Vaping  Use: Never used  Substance and Sexual Activity   Alcohol use: No   Drug use: No   Sexual activity: Not Currently  Other Topics Concern   Not on file  Social History Narrative   Not on file   Social Determinants of Health   Financial Resource Strain: Not on file  Food Insecurity: Not on file  Transportation Needs: Not on file  Physical Activity: Not on file  Stress: Not on file  Social Connections: Not on file  Intimate Partner Violence: Not on file     ROS- All systems are reviewed and negative except as per the HPI above.  Physical Exam: Vitals:  06/16/22 1044  BP: (!) 142/90  Pulse: 73  Weight: 73.5 kg  Height: 5' (1.524 m)     GEN- The patient is a well appearing elderly male, alert and oriented x 3 today.   HEENT-head normocephalic, atraumatic, sclera clear, conjunctiva pink, hearing intact, trachea midline. Lungs- Clear to ausculation bilaterally, normal work of breathing Heart- Regular rate and rhythm, no murmurs, rubs or gallops  GI- soft, NT, ND, + BS Extremities- no clubbing, cyanosis, or edema MS- no significant deformity or atrophy Skin- no rash or lesion Psych- euthymic mood, full affect Neuro- strength and sensation are intact   Wt Readings from Last 3 Encounters:  06/16/22 73.5 kg  05/19/22 73.6 kg  05/14/22 72 kg    EKG today demonstrates  V pacing with underlying afib Vent. rate 73 BPM PR interval * ms QRS duration 170 ms QT/QTcB 458/504 ms  Echo 11/04/21 demonstrated   1. Left ventricular ejection fraction, by estimation, is 50 to 55%. The  left ventricle has low normal function. The left ventricle has no regional  wall motion abnormalities. Left ventricular diastolic parameters are  indeterminate.   2. Right ventricular systolic function is normal. The right ventricular  size is normal. There is mildly elevated pulmonary artery systolic  pressure. The estimated right ventricular systolic pressure is 42.7 mmHg.   3. Left atrial size  was moderately dilated.   4. Right atrial size was mildly dilated.   5. The mitral valve is normal in structure. Trivial mitral valve  regurgitation. No evidence of mitral stenosis.   6. The aortic valve is tricuspid. There is mild calcification of the  aortic valve. Aortic valve regurgitation is not visualized. Aortic valve  sclerosis/calcification is present, without any evidence of aortic  stenosis.   7. The inferior vena cava is dilated in size with <50% respiratory  variability, suggesting right atrial pressure of 15 mmHg.   8. Negative bubble study, no PFO or ASD.   9. The patient was in atrial fibrillation.  Epic records are reviewed at length today  CHA2DS2-VASc Score = 6  The patient's score is based upon: CHF History: 0 HTN History: 1 Diabetes History: 0 Stroke History: 2 Vascular Disease History: 1 Age Score: 2 Gender Score: 0       ASSESSMENT AND PLAN: 1. Persistent Atrial Fibrillation (ICD10:  I48.19) The patient's CHA2DS2-VASc score is 6, indicating a 9.7% annual risk of stroke.   Had DCCV 03/25/22, back in afib 04/25/22 We discussed rhythm control options again today including AAD + DCCV vs rate control. Given his age and paucity of symptoms, he would like to pursue a conservative rate control strategy for now.  Continue Xarelto 15 mg daily  2. Secondary Hypercoagulable State (ICD10:  D68.69) The patient is at significant risk for stroke/thromboembolism based upon his CHA2DS2-VASc Score of 6.  Continue Rivaroxaban (Xarelto).   3. Obesity Body mass index is 31.64 kg/m. Lifestyle modification was discussed and encouraged including regular physical activity and weight reduction.  4. Obstructive sleep apnea Encouraged compliance with CPAP therapy.  5. Symptomatic bradycardia S/p PPM, followed by the device clinic.  6. HTN Stable, no changes today.   Follow up with EP APP per recall and with Dr Elease Hashimoto as scheduled.    Jorja Loa PA-C Afib  Clinic [facility] [address] [place], [place] [address] [phone] 06/16/2022 10:58 AM

## 2022-06-19 DIAGNOSIS — I10 Essential (primary) hypertension: Secondary | ICD-10-CM | POA: Diagnosis not present

## 2022-06-19 DIAGNOSIS — G4733 Obstructive sleep apnea (adult) (pediatric): Secondary | ICD-10-CM | POA: Diagnosis not present

## 2022-06-24 DIAGNOSIS — M1009 Idiopathic gout, multiple sites: Secondary | ICD-10-CM | POA: Diagnosis not present

## 2022-06-24 DIAGNOSIS — M459 Ankylosing spondylitis of unspecified sites in spine: Secondary | ICD-10-CM | POA: Diagnosis not present

## 2022-06-24 DIAGNOSIS — Z79899 Other long term (current) drug therapy: Secondary | ICD-10-CM | POA: Diagnosis not present

## 2022-06-24 DIAGNOSIS — M1991 Primary osteoarthritis, unspecified site: Secondary | ICD-10-CM | POA: Diagnosis not present

## 2022-06-24 DIAGNOSIS — M0600 Rheumatoid arthritis without rheumatoid factor, unspecified site: Secondary | ICD-10-CM | POA: Diagnosis not present

## 2022-08-18 ENCOUNTER — Ambulatory Visit (INDEPENDENT_AMBULATORY_CARE_PROVIDER_SITE_OTHER): Payer: Medicare Other

## 2022-08-18 DIAGNOSIS — I4819 Other persistent atrial fibrillation: Secondary | ICD-10-CM | POA: Diagnosis not present

## 2022-08-19 LAB — CUP PACEART REMOTE DEVICE CHECK
Battery Remaining Longevity: 56 mo
Battery Voltage: 2.95 V
Brady Statistic RA Percent Paced: 1.64 %
Brady Statistic RV Percent Paced: 99.78 %
Date Time Interrogation Session: 20231218004439
Implantable Lead Connection Status: 753985
Implantable Lead Connection Status: 753985
Implantable Lead Implant Date: 20180808
Implantable Lead Implant Date: 20180808
Implantable Lead Location: 753859
Implantable Lead Location: 753860
Implantable Lead Model: 5076
Implantable Lead Model: 5076
Implantable Pulse Generator Implant Date: 20180808
Lead Channel Impedance Value: 266 Ohm
Lead Channel Impedance Value: 323 Ohm
Lead Channel Impedance Value: 361 Ohm
Lead Channel Impedance Value: 475 Ohm
Lead Channel Pacing Threshold Amplitude: 1 V
Lead Channel Pacing Threshold Amplitude: 1 V
Lead Channel Pacing Threshold Pulse Width: 0.4 ms
Lead Channel Pacing Threshold Pulse Width: 0.4 ms
Lead Channel Sensing Intrinsic Amplitude: 0.5 mV
Lead Channel Sensing Intrinsic Amplitude: 0.5 mV
Lead Channel Sensing Intrinsic Amplitude: 9.125 mV
Lead Channel Sensing Intrinsic Amplitude: 9.125 mV
Lead Channel Setting Pacing Amplitude: 2 V
Lead Channel Setting Pacing Amplitude: 2 V
Lead Channel Setting Pacing Pulse Width: 0.4 ms
Lead Channel Setting Sensing Sensitivity: 0.9 mV
Zone Setting Status: 755011

## 2022-09-03 ENCOUNTER — Other Ambulatory Visit: Payer: Self-pay | Admitting: Cardiovascular Disease

## 2022-09-03 DIAGNOSIS — I4819 Other persistent atrial fibrillation: Secondary | ICD-10-CM

## 2022-09-03 NOTE — Telephone Encounter (Signed)
Prescription refill request for Xarelto received.  Indication: Afib  Last office visit: 06/16/22 Marlene Lard)  Weight: 73.5kg Age:  89 Scr: 1.47 (04/01/22)  CrCl: 31.46m/min  Appropriate dose and refill sent to requested pharmacy.

## 2022-09-18 DIAGNOSIS — Z9989 Dependence on other enabling machines and devices: Secondary | ICD-10-CM | POA: Diagnosis not present

## 2022-09-18 DIAGNOSIS — M0600 Rheumatoid arthritis without rheumatoid factor, unspecified site: Secondary | ICD-10-CM | POA: Diagnosis not present

## 2022-09-18 DIAGNOSIS — I480 Paroxysmal atrial fibrillation: Secondary | ICD-10-CM | POA: Diagnosis not present

## 2022-09-18 DIAGNOSIS — N1832 Chronic kidney disease, stage 3b: Secondary | ICD-10-CM | POA: Diagnosis not present

## 2022-09-18 DIAGNOSIS — D6869 Other thrombophilia: Secondary | ICD-10-CM | POA: Diagnosis not present

## 2022-09-24 NOTE — Progress Notes (Signed)
Remote pacemaker transmission.   

## 2022-10-02 DIAGNOSIS — H04123 Dry eye syndrome of bilateral lacrimal glands: Secondary | ICD-10-CM | POA: Diagnosis not present

## 2022-10-02 DIAGNOSIS — H401132 Primary open-angle glaucoma, bilateral, moderate stage: Secondary | ICD-10-CM | POA: Diagnosis not present

## 2022-10-03 DIAGNOSIS — L089 Local infection of the skin and subcutaneous tissue, unspecified: Secondary | ICD-10-CM | POA: Diagnosis not present

## 2022-10-30 DIAGNOSIS — M1991 Primary osteoarthritis, unspecified site: Secondary | ICD-10-CM | POA: Diagnosis not present

## 2022-10-30 DIAGNOSIS — M0600 Rheumatoid arthritis without rheumatoid factor, unspecified site: Secondary | ICD-10-CM | POA: Diagnosis not present

## 2022-10-30 DIAGNOSIS — Z79899 Other long term (current) drug therapy: Secondary | ICD-10-CM | POA: Diagnosis not present

## 2022-10-30 DIAGNOSIS — M1009 Idiopathic gout, multiple sites: Secondary | ICD-10-CM | POA: Diagnosis not present

## 2022-10-30 DIAGNOSIS — M459 Ankylosing spondylitis of unspecified sites in spine: Secondary | ICD-10-CM | POA: Diagnosis not present

## 2022-11-16 ENCOUNTER — Encounter: Payer: Self-pay | Admitting: Cardiovascular Disease
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
## 2022-11-17 ENCOUNTER — Encounter: Payer: Self-pay | Admitting: Cardiovascular Disease

## 2022-11-17 ENCOUNTER — Ambulatory Visit: Payer: Medicare Other | Attending: Cardiovascular Disease | Admitting: Cardiovascular Disease

## 2022-11-17 ENCOUNTER — Ambulatory Visit (INDEPENDENT_AMBULATORY_CARE_PROVIDER_SITE_OTHER): Payer: Medicare Other

## 2022-11-17 VITALS — BP 138/78 | HR 85 | Ht 60.0 in | Wt 162.2 lb

## 2022-11-17 DIAGNOSIS — I10 Essential (primary) hypertension: Secondary | ICD-10-CM | POA: Diagnosis not present

## 2022-11-17 DIAGNOSIS — I4819 Other persistent atrial fibrillation: Secondary | ICD-10-CM

## 2022-11-17 DIAGNOSIS — I495 Sick sinus syndrome: Secondary | ICD-10-CM | POA: Diagnosis not present

## 2022-11-17 NOTE — Patient Instructions (Signed)
Medication Instructions:  Your physician recommends that you continue on your current medications as directed. Please refer to the Current Medication list given to you today. *If you need a refill on your cardiac medications before your next appointment, please call your pharmacy*    Follow-Up: At [facility], you and your health needs are our priority.  As part of our continuing mission to provide you with exceptional heart care, we have created designated Provider Care Teams.  These Care Teams include your primary Cardiologist (physician) and Advanced Practice Providers (APPs -  Physician Assistants and Nurse Practitioners) who all work together to provide you with the care you need, when you need it.   Your next appointment:   6 month(s)  Provider:   Mertie Moores, MD

## 2022-11-18 LAB — CUP PACEART REMOTE DEVICE CHECK
Battery Remaining Longevity: 49 mo
Battery Voltage: 2.95 V
Brady Statistic RA Percent Paced: 0.64 %
Brady Statistic RV Percent Paced: 99.78 %
Date Time Interrogation Session: 20240318013037
Implantable Lead Connection Status: 753985
Implantable Lead Connection Status: 753985
Implantable Lead Implant Date: 20180808
Implantable Lead Implant Date: 20180808
Implantable Lead Location: 753859
Implantable Lead Location: 753860
Implantable Lead Model: 5076
Implantable Lead Model: 5076
Implantable Pulse Generator Implant Date: 20180808
Lead Channel Impedance Value: 266 Ohm
Lead Channel Impedance Value: 323 Ohm
Lead Channel Impedance Value: 361 Ohm
Lead Channel Impedance Value: 456 Ohm
Lead Channel Pacing Threshold Amplitude: 1 V
Lead Channel Pacing Threshold Amplitude: 1 V
Lead Channel Pacing Threshold Pulse Width: 0.4 ms
Lead Channel Pacing Threshold Pulse Width: 0.4 ms
Lead Channel Sensing Intrinsic Amplitude: 1.5 mV
Lead Channel Sensing Intrinsic Amplitude: 1.5 mV
Lead Channel Sensing Intrinsic Amplitude: 8.25 mV
Lead Channel Sensing Intrinsic Amplitude: 8.25 mV
Lead Channel Setting Pacing Amplitude: 2 V
Lead Channel Setting Pacing Amplitude: 2 V
Lead Channel Setting Pacing Pulse Width: 0.4 ms
Lead Channel Setting Sensing Sensitivity: 0.9 mV
Zone Setting Status: 755011

## 2022-12-01 DIAGNOSIS — R945 Abnormal results of liver function studies: Secondary | ICD-10-CM | POA: Diagnosis not present

## 2022-12-09 ENCOUNTER — Other Ambulatory Visit: Payer: Self-pay | Admitting: *Deleted

## 2022-12-09 MED ORDER — HYDROCHLOROTHIAZIDE 12.5 MG PO CAPS: 12.5000 mg | ORAL_CAPSULE | Freq: Every day | ORAL | 1 refills | Status: DC

## 2022-12-29 NOTE — Progress Notes (Signed)
Remote pacemaker transmission.   

## 2023-01-01 ENCOUNTER — Other Ambulatory Visit: Payer: Self-pay | Admitting: *Deleted

## 2023-01-01 DIAGNOSIS — K551 Chronic vascular disorders of intestine: Secondary | ICD-10-CM

## 2023-01-08 NOTE — Progress Notes (Signed)
Office Note    HPI: Mario Fisher is a 89 y.o. (June 17, 1934) male presenting in follow-up for superior mesenteric artery stenosis.    Seen roughly a year ago after utilization for abdominal pain.  This was believed to be a side effect of rheumatoid arthritis medication, however there was significant stenosis appreciated at the superior mesenteric artery on CT for which vascular surgery was called.  He was seen in the office and found to be asymptomatic with ultrasound study demonstrating 70% stenosis of the SMA, widely patent celiac, IMA.    On exam today, [name] was doing well, accompanied by his wife.  He continues to live an active lifestyle in retirement.  He and his wife have lived in the same home in [place] for over 50 years.  Denies recent weight loss, weight gain.  He denies postprandial pain.  He states some foods give him some acid reflux, for which he takes medication.   The pt is not on a statin for cholesterol management.  The pt is not on a daily aspirin.   Other AC:  Xarelto The pt is  on medication for hypertension.   The pt is not diabetic.  Tobacco hx:  -  Past Medical History:  Diagnosis Date   Arthritis    "back" (04/08/2017)   Atrial fibrillation (HCC)    B12 deficiency anemia    BPH (benign prostatic hypertrophy)    Chronic anticoagulation    on Pradaxa, now on xarelto not pradaxa   Chronic lower back pain    Glaucoma, both eyes    Glucose intolerance (impaired glucose tolerance)    History of blood transfusion    "I'm thinking they gave me blood when I had the gallbladder OR" (04/08/2017)   History of gout    History of hiatal hernia    Hyperlipidemia    Hypertension    OSA on CPAP    Presence of permanent cardiac pacemaker    Rheumatoid arthritis (HCC)    Stroke [facility])    remote CVA noted on MRI; "silent stroke; didn't know when it happened" (04/08/2017)    Past Surgical History:  Procedure Laterality Date   CARDIOVERSION  04/08/2017   Procedure:  Cardioversion;  Surgeon: Hillis Range, MD;  Location: [facility] INVASIVE CV LAB;  Service: Cardiovascular;;   CARDIOVERSION N/A 07/27/2018   Procedure: CARDIOVERSION;  Surgeon: Lewayne Bunting, MD;  Location: [facility] ENDOSCOPY;  Service: Cardiovascular;  Laterality: N/A;   CARDIOVERSION N/A 03/25/2022   Procedure: CARDIOVERSION;  Surgeon: Thomasene Ripple, DO;  Location: [facility] ENDOSCOPY;  Service: Cardiovascular;  Laterality: N/A;   CATARACT EXTRACTION W/ INTRAOCULAR LENS  IMPLANT, BILATERAL Bilateral    CHOLECYSTECTOMY OPEN     PACEMAKER IMPLANT N/A 04/08/2017   Medtronic Azure XT dual-chamber MRI conditional pacemaker for symptomatic bradycardia by Dr Johney Frame   US ECHOCARDIOGRAPHY  02/25/2005   ef 55-60%    Social History   Socioeconomic History   Marital status: Married    Spouse name: Not on file   Number of children: Not on file   Years of education: Not on file   Highest education level: Not on file  Occupational History   Not on file  Tobacco Use   Smoking status: Former    Types: Cigars    Quit date: 09/01/1968    Years since quitting: 54.3   Smokeless tobacco: Never   Tobacco comments:    Former smoker 03/12/22  Vaping Use   Vaping Use: Never used  Substance and Sexual  Activity   Alcohol use: No   Drug use: No   Sexual activity: Not Currently  Other Topics Concern   Not on file  Social History Narrative   Not on file   Social Determinants of Health   Financial Resource Strain: Not on file  Food Insecurity: Not on file  Transportation Needs: Not on file  Physical Activity: Not on file  Stress: Not on file  Social Connections: Not on file  Intimate Partner Violence: Not on file    Family History  Problem Relation Age of Onset   Arrhythmia Mother    Heart attack Father        x2   Hypertension Father    Stroke Father    Heart disease Sister    Hypertension Brother     Current Outpatient Medications  Medication Sig Dispense Refill   allopurinol (ZYLOPRIM) 100 MG tablet  Take 100 mg by mouth daily.     cyanocobalamin (,VITAMIN B-12,) 1000 MCG/ML injection Inject 1,000 mcg into the muscle every 30 (thirty) days.     dorzolamide (TRUSOPT) 2 % ophthalmic solution Place 1 drop into both eyes 2 (two) times daily.  12   Glucosamine-Chondroit-Vit C-Mn (GLUCOSAMINE CHONDR 1500 COMPLX PO) Take 1 tablet by mouth daily.     hydrochlorothiazide (MICROZIDE) 12.5 MG capsule Take 1 capsule (12.5 mg total) by mouth daily. 90 capsule 1   Multiple Vitamins-Minerals (MULTIVITAMIN ADULTS 50+ PO) Take 1 tablet by mouth daily.     Omega-3 Fatty Acids (FISH OIL) 1200 MG CAPS Take 1,200-2,400 mg by mouth See admin instructions. Take 2400 mg by mouth in the morning and take 1200 mg by mouth at bedtime     Propylene Glycol (SYSTANE BALANCE) 0.6 % SOLN Place 1 drop into both eyes in the morning and at bedtime.     Rivaroxaban (XARELTO) 15 MG TABS tablet TAKE 1 TABLET BY MOUTH ONCE DAILY WITH SUPPER 90 tablet 1   tamsulosin (FLOMAX) 0.4 MG CAPS capsule Take 0.4 mg by mouth daily.     valsartan (DIOVAN) 80 MG tablet Take 40-80 mg by mouth daily. Patient checks BP and if its low will only take 1/2 tab = 40mg      No current facility-administered medications for this visit.    Allergies  Allergen Reactions   Statins Other (See Comments)    Joint pain   Upadacitinib     Other reaction(s): ?chills   Enbrel [Etanercept] Rash     REVIEW OF SYSTEMS:   [X]  denotes positive finding, [ ]  denotes negative finding Cardiac  Comments:  Chest pain or chest pressure:    Shortness of breath upon exertion:    Short of breath when lying flat:    Irregular heart rhythm:        Vascular    Pain in calf, thigh, or hip brought on by ambulation:    Pain in feet at night that wakes you up from your sleep:     Blood clot in your veins:    Leg swelling:         Pulmonary    Oxygen at home:    Productive cough:     Wheezing:         Neurologic    Sudden weakness in arms or legs:     Sudden  numbness in arms or legs:     Sudden onset of difficulty speaking or slurred speech:    Temporary loss of vision in one eye:  Problems with dizziness:         Gastrointestinal    Blood in stool:     Vomited blood:         Genitourinary    Burning when urinating:     Blood in urine:        Psychiatric    Major depression:         Hematologic    Bleeding problems:    Problems with blood clotting too easily:        Skin    Rashes or ulcers:        Constitutional    Fever or chills:      PHYSICAL EXAMINATION:  There were no vitals filed for this visit.   General:  WDWN in NAD; vital signs documented above Gait: Not observed HENT: WNL, normocephalic Pulmonary: normal non-labored breathing , without wheezing Cardiac: regular HR Abdomen: soft, NT, no masses Skin: without rashes Vascular Exam/Pulses:  Right Left  Radial 2+ (normal) 2+ (normal)  Ulnar 2+ (normal) 2+ (normal)  Femoral    Popliteal    DP 2+ (normal) 2+ (normal)  PT 2+ (normal) 2+ (normal)   Extremities: without ischemic changes, without Gangrene , without cellulitis; without open wounds;  Musculoskeletal: no muscle wasting or atrophy  Neurologic: A&O X 3;  No focal weakness or paresthesias are detected Psychiatric:  The pt has Normal affect.   Non-Invasive Vascular Imaging:        Duplex Findings:  +----------------------+--------+--------+------+--------+  Mesenteric           PSV cm/sEDV cm/sPlaqueComments  +----------------------+--------+--------+------+--------+  Aorta Prox               54                           +----------------------+--------+--------+------+--------+  Celiac Artery Origin     76                           +----------------------+--------+--------+------+--------+  Celiac Artery Proximal  104      17                   +----------------------+--------+--------+------+--------+  SMA Origin               82      15                    +----------------------+--------+--------+------+--------+  SMA Proximal            266      44                   +----------------------+--------+--------+------+--------+  SMA Mid                 245      21                   +----------------------+--------+--------+------+--------+  SMA Distal               58      11                   +----------------------+--------+--------+------+--------+  IMA                    175      10                   +----------------------+--------+--------+------+--------+  ASSESSMENT/PLAN: RAMZI HARKLEROAD is a 89 y.o. male presenting with asymptomatic superior mesenteric artery stenosis.  Imaging today demonstrates a stenosis less than 70%.  No signs or symptoms of chronic mesenteric ischemia. He is asymptomatic with improved studies, we discussed as needed follow-up.  He and his wife felt comfortable with this plan.  I gave him my card and him to call me should any questions or concerns arises I am happy to see them in the future.  I asked that he continue his current medication regimen.     Victorino Sparrow, MD [facility] [phone] Total time of patient care including pre-visit research, consultation, and documentation greater than 20 minutes

## 2023-01-09 ENCOUNTER — Encounter: Payer: Self-pay | Admitting: Vascular Surgery

## 2023-01-09 ENCOUNTER — Ambulatory Visit: Payer: Medicare Other | Admitting: Vascular Surgery

## 2023-01-09 ENCOUNTER — Ambulatory Visit (HOSPITAL_COMMUNITY)
Admission: RE | Admit: 2023-01-09 | Discharge: 2023-01-09 | Disposition: A | Discharge status: Home / Self Care | Payer: Medicare Other | Source: Ambulatory Visit | Attending: Vascular Surgery | Admitting: Vascular Surgery

## 2023-01-09 VITALS — BP 149/90 | HR 63 | Temp 98.0°F | Resp 20 | Ht 60.0 in | Wt 163.0 lb

## 2023-01-09 DIAGNOSIS — K551 Chronic vascular disorders of intestine: Secondary | ICD-10-CM

## 2023-01-12 DIAGNOSIS — Z0000 Encounter for general adult medical examination without abnormal findings: Secondary | ICD-10-CM | POA: Diagnosis not present

## 2023-01-12 DIAGNOSIS — I10 Essential (primary) hypertension: Secondary | ICD-10-CM | POA: Diagnosis not present

## 2023-01-12 DIAGNOSIS — I480 Paroxysmal atrial fibrillation: Secondary | ICD-10-CM | POA: Diagnosis not present

## 2023-01-12 DIAGNOSIS — E782 Mixed hyperlipidemia: Secondary | ICD-10-CM | POA: Diagnosis not present

## 2023-01-12 DIAGNOSIS — N1832 Chronic kidney disease, stage 3b: Secondary | ICD-10-CM | POA: Diagnosis not present

## 2023-01-12 DIAGNOSIS — L97509 Non-pressure chronic ulcer of other part of unspecified foot with unspecified severity: Secondary | ICD-10-CM | POA: Diagnosis not present

## 2023-01-12 DIAGNOSIS — K551 Chronic vascular disorders of intestine: Secondary | ICD-10-CM | POA: Diagnosis not present

## 2023-01-12 DIAGNOSIS — D638 Anemia in other chronic diseases classified elsewhere: Secondary | ICD-10-CM | POA: Diagnosis not present

## 2023-01-12 DIAGNOSIS — N2581 Secondary hyperparathyroidism of renal origin: Secondary | ICD-10-CM | POA: Diagnosis not present

## 2023-01-12 DIAGNOSIS — K219 Gastro-esophageal reflux disease without esophagitis: Secondary | ICD-10-CM | POA: Diagnosis not present

## 2023-01-12 DIAGNOSIS — D6869 Other thrombophilia: Secondary | ICD-10-CM | POA: Diagnosis not present

## 2023-01-12 DIAGNOSIS — M109 Gout, unspecified: Secondary | ICD-10-CM | POA: Diagnosis not present

## 2023-01-12 DIAGNOSIS — R7301 Impaired fasting glucose: Secondary | ICD-10-CM | POA: Diagnosis not present

## 2023-01-19 DIAGNOSIS — M79671 Pain in right foot: Secondary | ICD-10-CM | POA: Diagnosis not present

## 2023-01-19 DIAGNOSIS — B353 Tinea pedis: Secondary | ICD-10-CM | POA: Diagnosis not present

## 2023-01-19 DIAGNOSIS — L03115 Cellulitis of right lower limb: Secondary | ICD-10-CM | POA: Diagnosis not present

## 2023-01-19 DIAGNOSIS — M2041 Other hammer toe(s) (acquired), right foot: Secondary | ICD-10-CM | POA: Diagnosis not present

## 2023-01-23 DIAGNOSIS — G4733 Obstructive sleep apnea (adult) (pediatric): Secondary | ICD-10-CM | POA: Diagnosis not present

## 2023-02-09 DIAGNOSIS — M2041 Other hammer toe(s) (acquired), right foot: Secondary | ICD-10-CM | POA: Diagnosis not present

## 2023-02-09 DIAGNOSIS — M79671 Pain in right foot: Secondary | ICD-10-CM | POA: Diagnosis not present

## 2023-02-09 DIAGNOSIS — B351 Tinea unguium: Secondary | ICD-10-CM | POA: Diagnosis not present

## 2023-02-09 DIAGNOSIS — L03115 Cellulitis of right lower limb: Secondary | ICD-10-CM | POA: Diagnosis not present

## 2023-02-09 DIAGNOSIS — B353 Tinea pedis: Secondary | ICD-10-CM | POA: Diagnosis not present

## 2023-02-16 ENCOUNTER — Ambulatory Visit (INDEPENDENT_AMBULATORY_CARE_PROVIDER_SITE_OTHER): Payer: Medicare Other

## 2023-02-16 DIAGNOSIS — I495 Sick sinus syndrome: Secondary | ICD-10-CM

## 2023-02-17 LAB — CUP PACEART REMOTE DEVICE CHECK
Battery Remaining Longevity: 46 mo
Battery Voltage: 2.95 V
Brady Statistic RA Percent Paced: 0.85 %
Brady Statistic RV Percent Paced: 99.99 %
Date Time Interrogation Session: 20240617014513
Implantable Lead Connection Status: 753985
Implantable Lead Connection Status: 753985
Implantable Lead Implant Date: 20180808
Implantable Lead Implant Date: 20180808
Implantable Lead Location: 753859
Implantable Lead Location: 753860
Implantable Lead Model: 5076
Implantable Lead Model: 5076
Implantable Pulse Generator Implant Date: 20180808
Lead Channel Impedance Value: 285 Ohm
Lead Channel Impedance Value: 342 Ohm
Lead Channel Impedance Value: 361 Ohm
Lead Channel Impedance Value: 456 Ohm
Lead Channel Pacing Threshold Amplitude: 0.875 V
Lead Channel Pacing Threshold Amplitude: 1 V
Lead Channel Pacing Threshold Pulse Width: 0.4 ms
Lead Channel Pacing Threshold Pulse Width: 0.4 ms
Lead Channel Sensing Intrinsic Amplitude: 1.5 mV
Lead Channel Sensing Intrinsic Amplitude: 1.5 mV
Lead Channel Sensing Intrinsic Amplitude: 8.25 mV
Lead Channel Sensing Intrinsic Amplitude: 8.25 mV
Lead Channel Setting Pacing Amplitude: 2 V
Lead Channel Setting Pacing Amplitude: 2 V
Lead Channel Setting Pacing Pulse Width: 0.4 ms
Lead Channel Setting Sensing Sensitivity: 0.9 mV
Zone Setting Status: 755011

## 2023-02-18 DIAGNOSIS — B351 Tinea unguium: Secondary | ICD-10-CM | POA: Diagnosis not present

## 2023-02-18 DIAGNOSIS — M79671 Pain in right foot: Secondary | ICD-10-CM | POA: Diagnosis not present

## 2023-02-18 DIAGNOSIS — L239 Allergic contact dermatitis, unspecified cause: Secondary | ICD-10-CM | POA: Diagnosis not present

## 2023-02-18 DIAGNOSIS — B353 Tinea pedis: Secondary | ICD-10-CM | POA: Diagnosis not present

## 2023-02-18 DIAGNOSIS — H04123 Dry eye syndrome of bilateral lacrimal glands: Secondary | ICD-10-CM | POA: Diagnosis not present

## 2023-02-18 DIAGNOSIS — H401132 Primary open-angle glaucoma, bilateral, moderate stage: Secondary | ICD-10-CM | POA: Diagnosis not present

## 2023-02-18 DIAGNOSIS — L03115 Cellulitis of right lower limb: Secondary | ICD-10-CM | POA: Diagnosis not present

## 2023-02-18 DIAGNOSIS — M2041 Other hammer toe(s) (acquired), right foot: Secondary | ICD-10-CM | POA: Diagnosis not present

## 2023-02-28 ENCOUNTER — Other Ambulatory Visit: Payer: Self-pay | Admitting: Cardiovascular Disease

## 2023-02-28 DIAGNOSIS — I4819 Other persistent atrial fibrillation: Secondary | ICD-10-CM

## 2023-03-02 DIAGNOSIS — M79671 Pain in right foot: Secondary | ICD-10-CM | POA: Diagnosis not present

## 2023-03-02 DIAGNOSIS — L239 Allergic contact dermatitis, unspecified cause: Secondary | ICD-10-CM | POA: Diagnosis not present

## 2023-03-02 DIAGNOSIS — L03115 Cellulitis of right lower limb: Secondary | ICD-10-CM | POA: Diagnosis not present

## 2023-03-02 DIAGNOSIS — M2041 Other hammer toe(s) (acquired), right foot: Secondary | ICD-10-CM | POA: Diagnosis not present

## 2023-03-02 DIAGNOSIS — B353 Tinea pedis: Secondary | ICD-10-CM | POA: Diagnosis not present

## 2023-03-02 DIAGNOSIS — B351 Tinea unguium: Secondary | ICD-10-CM | POA: Diagnosis not present

## 2023-03-02 NOTE — Telephone Encounter (Signed)
Prescription refill request for Xarelto received.  Indication: Afib  Last office visit: 11/17/22 (Nahser)  Weight: 73.9kg Age: 89 Scr: 1.47 (04/01/22)  CrCl: 37.64ml/min  Appropriate dose. Refill sent.

## 2023-03-09 NOTE — Progress Notes (Signed)
Remote pacemaker transmission.   

## 2023-03-31 DIAGNOSIS — B353 Tinea pedis: Secondary | ICD-10-CM | POA: Diagnosis not present

## 2023-03-31 DIAGNOSIS — L03115 Cellulitis of right lower limb: Secondary | ICD-10-CM | POA: Diagnosis not present

## 2023-03-31 DIAGNOSIS — M2041 Other hammer toe(s) (acquired), right foot: Secondary | ICD-10-CM | POA: Diagnosis not present

## 2023-03-31 DIAGNOSIS — L239 Allergic contact dermatitis, unspecified cause: Secondary | ICD-10-CM | POA: Diagnosis not present

## 2023-03-31 DIAGNOSIS — M79671 Pain in right foot: Secondary | ICD-10-CM | POA: Diagnosis not present

## 2023-03-31 DIAGNOSIS — B351 Tinea unguium: Secondary | ICD-10-CM | POA: Diagnosis not present

## 2023-04-23 DIAGNOSIS — N189 Chronic kidney disease, unspecified: Secondary | ICD-10-CM | POA: Diagnosis not present

## 2023-04-23 DIAGNOSIS — I129 Hypertensive chronic kidney disease with stage 1 through stage 4 chronic kidney disease, or unspecified chronic kidney disease: Secondary | ICD-10-CM | POA: Diagnosis not present

## 2023-04-23 DIAGNOSIS — N1832 Chronic kidney disease, stage 3b: Secondary | ICD-10-CM | POA: Diagnosis not present

## 2023-04-23 DIAGNOSIS — D631 Anemia in chronic kidney disease: Secondary | ICD-10-CM | POA: Diagnosis not present

## 2023-04-23 DIAGNOSIS — N2581 Secondary hyperparathyroidism of renal origin: Secondary | ICD-10-CM | POA: Diagnosis not present

## 2023-04-28 DIAGNOSIS — M2041 Other hammer toe(s) (acquired), right foot: Secondary | ICD-10-CM | POA: Diagnosis not present

## 2023-04-28 DIAGNOSIS — B351 Tinea unguium: Secondary | ICD-10-CM | POA: Diagnosis not present

## 2023-04-28 DIAGNOSIS — L239 Allergic contact dermatitis, unspecified cause: Secondary | ICD-10-CM | POA: Diagnosis not present

## 2023-04-28 DIAGNOSIS — L03115 Cellulitis of right lower limb: Secondary | ICD-10-CM | POA: Diagnosis not present

## 2023-04-28 DIAGNOSIS — M79671 Pain in right foot: Secondary | ICD-10-CM | POA: Diagnosis not present

## 2023-04-28 DIAGNOSIS — B353 Tinea pedis: Secondary | ICD-10-CM | POA: Diagnosis not present

## 2023-04-30 DIAGNOSIS — M1991 Primary osteoarthritis, unspecified site: Secondary | ICD-10-CM | POA: Diagnosis not present

## 2023-04-30 DIAGNOSIS — M459 Ankylosing spondylitis of unspecified sites in spine: Secondary | ICD-10-CM | POA: Diagnosis not present

## 2023-04-30 DIAGNOSIS — M1009 Idiopathic gout, multiple sites: Secondary | ICD-10-CM | POA: Diagnosis not present

## 2023-04-30 DIAGNOSIS — M0600 Rheumatoid arthritis without rheumatoid factor, unspecified site: Secondary | ICD-10-CM | POA: Diagnosis not present

## 2023-04-30 DIAGNOSIS — Z79899 Other long term (current) drug therapy: Secondary | ICD-10-CM | POA: Diagnosis not present

## 2023-05-18 ENCOUNTER — Ambulatory Visit (INDEPENDENT_AMBULATORY_CARE_PROVIDER_SITE_OTHER): Payer: Medicare Other

## 2023-05-18 DIAGNOSIS — I4819 Other persistent atrial fibrillation: Secondary | ICD-10-CM

## 2023-05-18 DIAGNOSIS — I495 Sick sinus syndrome: Secondary | ICD-10-CM | POA: Diagnosis not present

## 2023-05-19 LAB — CUP PACEART REMOTE DEVICE CHECK
Battery Remaining Longevity: 45 mo
Battery Voltage: 2.94 V
Brady Statistic RA Percent Paced: 1.3 %
Brady Statistic RV Percent Paced: 99.97 %
Date Time Interrogation Session: 20240916014309
Implantable Lead Connection Status: 753985
Implantable Lead Connection Status: 753985
Implantable Lead Implant Date: 20180808
Implantable Lead Implant Date: 20180808
Implantable Lead Location: 753859
Implantable Lead Location: 753860
Implantable Lead Model: 5076
Implantable Lead Model: 5076
Implantable Pulse Generator Implant Date: 20180808
Lead Channel Impedance Value: 266 Ohm
Lead Channel Impedance Value: 342 Ohm
Lead Channel Impedance Value: 361 Ohm
Lead Channel Impedance Value: 456 Ohm
Lead Channel Pacing Threshold Amplitude: 1 V
Lead Channel Pacing Threshold Amplitude: 1 V
Lead Channel Pacing Threshold Pulse Width: 0.4 ms
Lead Channel Pacing Threshold Pulse Width: 0.4 ms
Lead Channel Sensing Intrinsic Amplitude: 1.375 mV
Lead Channel Sensing Intrinsic Amplitude: 1.375 mV
Lead Channel Sensing Intrinsic Amplitude: 9.75 mV
Lead Channel Sensing Intrinsic Amplitude: 9.75 mV
Lead Channel Setting Pacing Amplitude: 2 V
Lead Channel Setting Pacing Amplitude: 2 V
Lead Channel Setting Pacing Pulse Width: 0.4 ms
Lead Channel Setting Sensing Sensitivity: 0.9 mV
Zone Setting Status: 755011

## 2023-05-22 ENCOUNTER — Ambulatory Visit: Payer: Medicare Other | Admitting: Cardiovascular Disease

## 2023-05-25 DIAGNOSIS — R945 Abnormal results of liver function studies: Secondary | ICD-10-CM | POA: Diagnosis not present

## 2023-05-30 IMAGING — DX DG CHEST 1V PORT
1 series · 1 of 1 positions shown · non-contrast
Comparison: Chest CT 09/03/2017

CLINICAL DATA: Fever.

EXAM:
PORTABLE CHEST 1 VIEW

[chest ap]
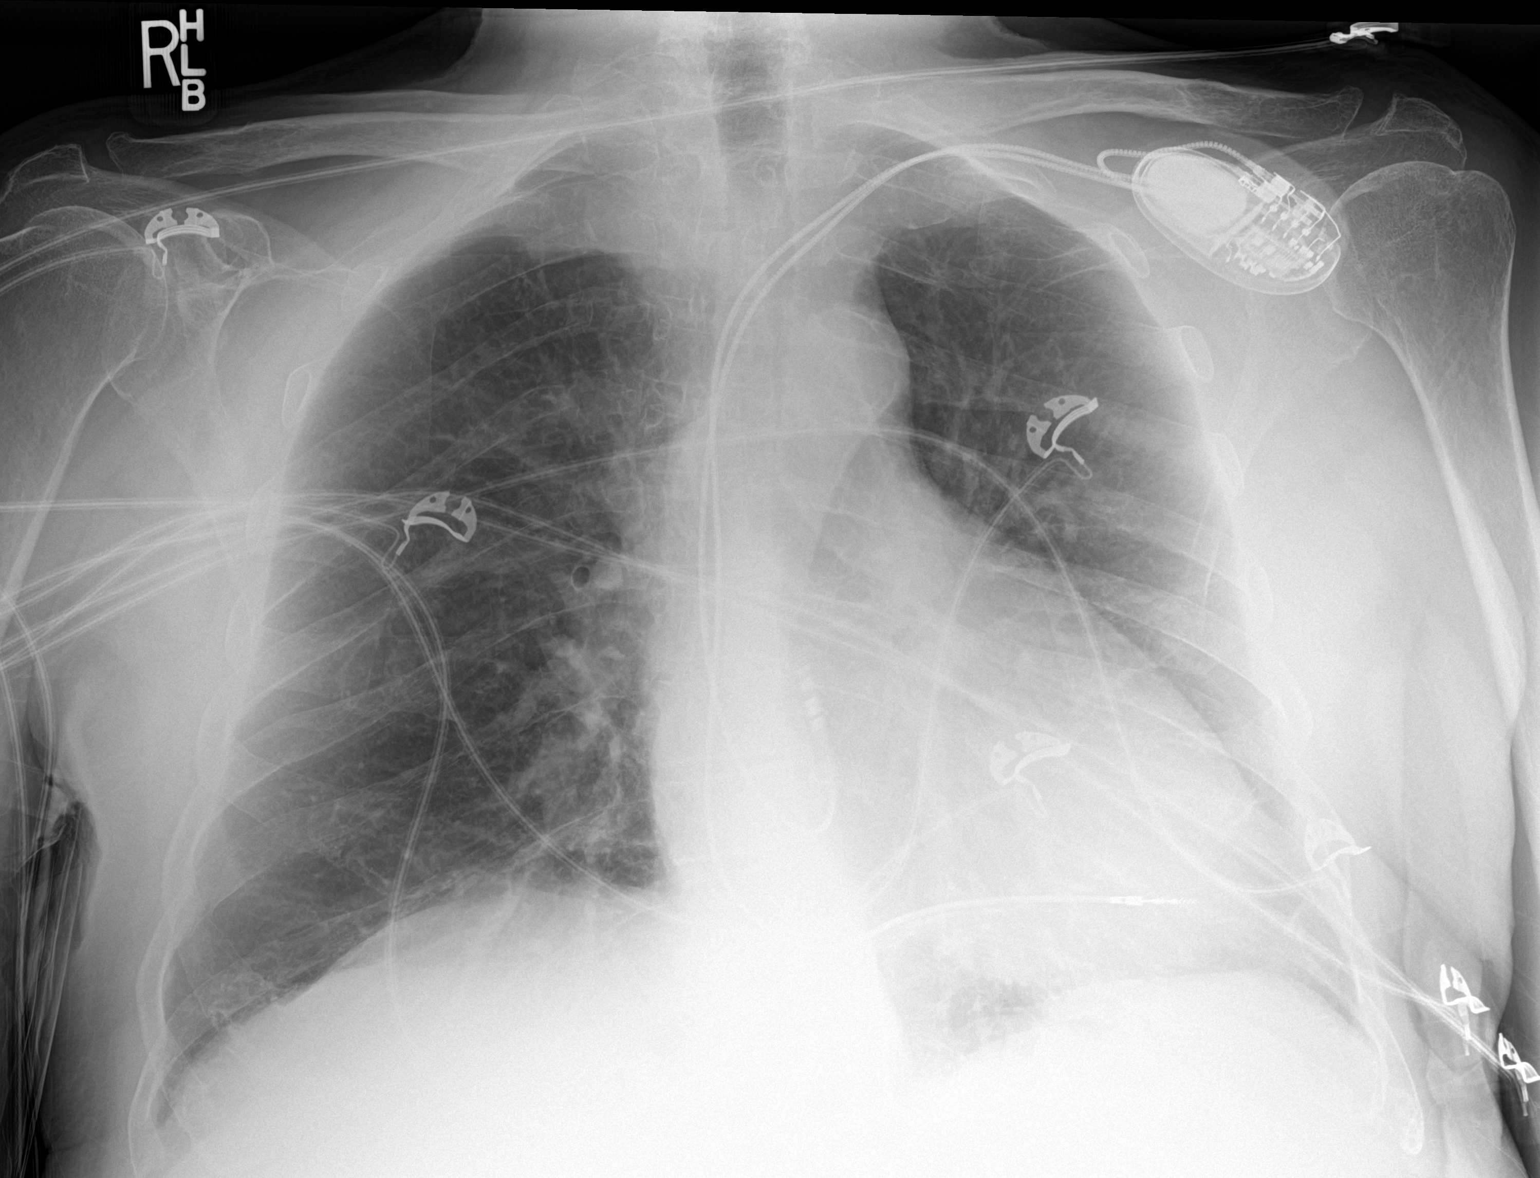

[1 of 1 positions shown; findings below may reference images not displayed]

FINDINGS: Left-sided pacemaker in place. Mild cardiomegaly. Normal mediastinal
contours. No confluent consolidation or evidence of pneumonia. No
pulmonary edema no pleural fluid or pneumothorax.
IMPRESSION: 1. Mild cardiomegaly. No evidence of pneumonia or explanation for
fever.
2. Left-sided pacemaker in place.

## 2023-06-01 NOTE — Progress Notes (Signed)
Remote pacemaker transmission.   

## 2023-06-15 DIAGNOSIS — D631 Anemia in chronic kidney disease: Secondary | ICD-10-CM | POA: Diagnosis not present

## 2023-06-15 DIAGNOSIS — N2581 Secondary hyperparathyroidism of renal origin: Secondary | ICD-10-CM | POA: Diagnosis not present

## 2023-06-15 DIAGNOSIS — I129 Hypertensive chronic kidney disease with stage 1 through stage 4 chronic kidney disease, or unspecified chronic kidney disease: Secondary | ICD-10-CM | POA: Diagnosis not present

## 2023-06-15 DIAGNOSIS — N1832 Chronic kidney disease, stage 3b: Secondary | ICD-10-CM | POA: Diagnosis not present

## 2023-06-16 DIAGNOSIS — L218 Other seborrheic dermatitis: Secondary | ICD-10-CM | POA: Diagnosis not present

## 2023-06-16 DIAGNOSIS — Z86008 Personal history of in-situ neoplasm of other site: Secondary | ICD-10-CM | POA: Diagnosis not present

## 2023-06-16 DIAGNOSIS — L570 Actinic keratosis: Secondary | ICD-10-CM | POA: Diagnosis not present

## 2023-06-16 DIAGNOSIS — Z129 Encounter for screening for malignant neoplasm, site unspecified: Secondary | ICD-10-CM | POA: Diagnosis not present

## 2023-06-16 DIAGNOSIS — D692 Other nonthrombocytopenic purpura: Secondary | ICD-10-CM | POA: Diagnosis not present

## 2023-06-16 LAB — LAB REPORT - SCANNED: EGFR: 54

## 2023-06-17 DIAGNOSIS — H401132 Primary open-angle glaucoma, bilateral, moderate stage: Secondary | ICD-10-CM | POA: Diagnosis not present

## 2023-06-17 DIAGNOSIS — H04123 Dry eye syndrome of bilateral lacrimal glands: Secondary | ICD-10-CM | POA: Diagnosis not present

## 2023-06-24 DIAGNOSIS — M79671 Pain in right foot: Secondary | ICD-10-CM | POA: Diagnosis not present

## 2023-06-24 DIAGNOSIS — B351 Tinea unguium: Secondary | ICD-10-CM | POA: Diagnosis not present

## 2023-06-24 DIAGNOSIS — L03115 Cellulitis of right lower limb: Secondary | ICD-10-CM | POA: Diagnosis not present

## 2023-06-24 DIAGNOSIS — L239 Allergic contact dermatitis, unspecified cause: Secondary | ICD-10-CM | POA: Diagnosis not present

## 2023-06-24 DIAGNOSIS — B353 Tinea pedis: Secondary | ICD-10-CM | POA: Diagnosis not present

## 2023-06-24 DIAGNOSIS — M2041 Other hammer toe(s) (acquired), right foot: Secondary | ICD-10-CM | POA: Diagnosis not present

## 2023-06-25 DIAGNOSIS — G4733 Obstructive sleep apnea (adult) (pediatric): Secondary | ICD-10-CM | POA: Diagnosis not present

## 2023-07-23 DIAGNOSIS — D638 Anemia in other chronic diseases classified elsewhere: Secondary | ICD-10-CM | POA: Diagnosis not present

## 2023-07-23 DIAGNOSIS — I480 Paroxysmal atrial fibrillation: Secondary | ICD-10-CM | POA: Diagnosis not present

## 2023-07-23 DIAGNOSIS — N1832 Chronic kidney disease, stage 3b: Secondary | ICD-10-CM | POA: Diagnosis not present

## 2023-07-23 DIAGNOSIS — E782 Mixed hyperlipidemia: Secondary | ICD-10-CM | POA: Diagnosis not present

## 2023-07-23 DIAGNOSIS — D510 Vitamin B12 deficiency anemia due to intrinsic factor deficiency: Secondary | ICD-10-CM | POA: Diagnosis not present

## 2023-07-23 DIAGNOSIS — D6869 Other thrombophilia: Secondary | ICD-10-CM | POA: Diagnosis not present

## 2023-07-23 DIAGNOSIS — I10 Essential (primary) hypertension: Secondary | ICD-10-CM | POA: Diagnosis not present

## 2023-07-23 DIAGNOSIS — M109 Gout, unspecified: Secondary | ICD-10-CM | POA: Diagnosis not present

## 2023-08-05 ENCOUNTER — Other Ambulatory Visit: Payer: Self-pay | Admitting: Cardiovascular Disease

## 2023-08-11 DIAGNOSIS — M79671 Pain in right foot: Secondary | ICD-10-CM | POA: Diagnosis not present

## 2023-08-11 DIAGNOSIS — L239 Allergic contact dermatitis, unspecified cause: Secondary | ICD-10-CM | POA: Diagnosis not present

## 2023-08-11 DIAGNOSIS — B351 Tinea unguium: Secondary | ICD-10-CM | POA: Diagnosis not present

## 2023-08-11 DIAGNOSIS — B353 Tinea pedis: Secondary | ICD-10-CM | POA: Diagnosis not present

## 2023-08-17 ENCOUNTER — Ambulatory Visit (INDEPENDENT_AMBULATORY_CARE_PROVIDER_SITE_OTHER): Payer: Medicare Other

## 2023-08-17 DIAGNOSIS — I495 Sick sinus syndrome: Secondary | ICD-10-CM | POA: Diagnosis not present

## 2023-08-17 LAB — CUP PACEART REMOTE DEVICE CHECK
Battery Remaining Longevity: 43 mo
Battery Voltage: 2.94 V
Brady Statistic RA Percent Paced: 1.02 %
Brady Statistic RV Percent Paced: 99.99 %
Date Time Interrogation Session: 20241216004230
Implantable Lead Connection Status: 753985
Implantable Lead Connection Status: 753985
Implantable Lead Implant Date: 20180808
Implantable Lead Implant Date: 20180808
Implantable Lead Location: 753859
Implantable Lead Location: 753860
Implantable Lead Model: 5076
Implantable Lead Model: 5076
Implantable Pulse Generator Implant Date: 20180808
Lead Channel Impedance Value: 266 Ohm
Lead Channel Impedance Value: 323 Ohm
Lead Channel Impedance Value: 361 Ohm
Lead Channel Impedance Value: 456 Ohm
Lead Channel Pacing Threshold Amplitude: 1 V
Lead Channel Pacing Threshold Amplitude: 1 V
Lead Channel Pacing Threshold Pulse Width: 0.4 ms
Lead Channel Pacing Threshold Pulse Width: 0.4 ms
Lead Channel Sensing Intrinsic Amplitude: 0.375 mV
Lead Channel Sensing Intrinsic Amplitude: 0.375 mV
Lead Channel Sensing Intrinsic Amplitude: 9.75 mV
Lead Channel Sensing Intrinsic Amplitude: 9.75 mV
Lead Channel Setting Pacing Amplitude: 2 V
Lead Channel Setting Pacing Amplitude: 2 V
Lead Channel Setting Pacing Pulse Width: 0.4 ms
Lead Channel Setting Sensing Sensitivity: 0.9 mV
Zone Setting Status: 755011

## 2023-08-20 ENCOUNTER — Other Ambulatory Visit: Payer: Self-pay | Admitting: Cardiovascular Disease

## 2023-08-20 DIAGNOSIS — I4819 Other persistent atrial fibrillation: Secondary | ICD-10-CM

## 2023-08-20 NOTE — Telephone Encounter (Signed)
Prescription refill request for Xarelto received.  Indication: AF Last office visit: 11/17/22  P Nahser MD Weight: 73.6kg Age: 89 Scr: 1.28 on 06/15/23  Epic CrCl: 42.33  Based on above findings Xarelto 15mg  daily is the appropriate dose.  Refill approved.

## 2023-09-07 ENCOUNTER — Encounter: Payer: Self-pay | Admitting: Cardiovascular Disease
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
## 2023-09-09 ENCOUNTER — Ambulatory Visit: Payer: Medicare Other | Attending: Cardiovascular Disease | Admitting: Cardiovascular Disease

## 2023-09-09 ENCOUNTER — Encounter: Payer: Self-pay | Admitting: Cardiovascular Disease

## 2023-09-09 VITALS — BP 136/78 | HR 68 | Ht 60.0 in | Wt 167.2 lb

## 2023-09-09 DIAGNOSIS — I10 Essential (primary) hypertension: Secondary | ICD-10-CM | POA: Diagnosis not present

## 2023-09-09 DIAGNOSIS — L239 Allergic contact dermatitis, unspecified cause: Secondary | ICD-10-CM | POA: Diagnosis not present

## 2023-09-09 DIAGNOSIS — I4819 Other persistent atrial fibrillation: Secondary | ICD-10-CM | POA: Diagnosis not present

## 2023-09-09 DIAGNOSIS — M79671 Pain in right foot: Secondary | ICD-10-CM | POA: Diagnosis not present

## 2023-09-09 DIAGNOSIS — B351 Tinea unguium: Secondary | ICD-10-CM | POA: Diagnosis not present

## 2023-09-09 DIAGNOSIS — B353 Tinea pedis: Secondary | ICD-10-CM | POA: Diagnosis not present

## 2023-09-09 NOTE — Patient Instructions (Addendum)
 Medication Instructions:  Your physician recommends that you continue on your current medications as directed. Please refer to the Current Medication list given to you today.  *If you need a refill on your cardiac medications before your next appointment, please call your pharmacy*  Lab Work: None ordered today.  Testing/Procedures: None ordered today.  Follow-Up: At [facility], you and your health needs are our priority.  As part of our continuing mission to provide you with exceptional heart care, we have created designated Provider Care Teams.  These Care Teams include your primary Cardiologist (physician) and Advanced Practice Providers (APPs -  Physician Assistants and Nurse Practitioners) who all work together to provide you with the care you need, when you need it.  Your next appointment:   Your physician wants you to follow-up in: 1 year. You will receive a reminder letter in the mail two months in advance. If you don't receive a letter, please call our office to schedule the follow-up appointment.  The format for your next appointment:   In Person  Provider:   Darryle Decent, MD {  Other Instructions Choices  of anticoagulation agents   Eliquis   (apixaban)  Xarelto    ( Rivaroxaban ) Pradaxa   ( Digigatran)  Let us  know which your insurance prefers .

## 2023-09-21 NOTE — Progress Notes (Signed)
Remote pacemaker transmission.   

## 2023-10-01 DIAGNOSIS — H401132 Primary open-angle glaucoma, bilateral, moderate stage: Secondary | ICD-10-CM | POA: Diagnosis not present

## 2023-10-01 DIAGNOSIS — H04123 Dry eye syndrome of bilateral lacrimal glands: Secondary | ICD-10-CM | POA: Diagnosis not present

## 2023-10-29 DIAGNOSIS — M459 Ankylosing spondylitis of unspecified sites in spine: Secondary | ICD-10-CM | POA: Diagnosis not present

## 2023-10-29 DIAGNOSIS — Z79899 Other long term (current) drug therapy: Secondary | ICD-10-CM | POA: Diagnosis not present

## 2023-10-29 DIAGNOSIS — M0600 Rheumatoid arthritis without rheumatoid factor, unspecified site: Secondary | ICD-10-CM | POA: Diagnosis not present

## 2023-10-29 DIAGNOSIS — M1991 Primary osteoarthritis, unspecified site: Secondary | ICD-10-CM | POA: Diagnosis not present

## 2023-10-29 DIAGNOSIS — M1009 Idiopathic gout, multiple sites: Secondary | ICD-10-CM | POA: Diagnosis not present

## 2023-10-29 DIAGNOSIS — R945 Abnormal results of liver function studies: Secondary | ICD-10-CM | POA: Diagnosis not present

## 2023-11-16 ENCOUNTER — Ambulatory Visit (INDEPENDENT_AMBULATORY_CARE_PROVIDER_SITE_OTHER): Payer: Medicare Other

## 2023-11-16 DIAGNOSIS — I4819 Other persistent atrial fibrillation: Secondary | ICD-10-CM | POA: Diagnosis not present

## 2023-11-16 DIAGNOSIS — I495 Sick sinus syndrome: Secondary | ICD-10-CM

## 2023-11-17 LAB — CUP PACEART REMOTE DEVICE CHECK
Battery Remaining Longevity: 38 mo
Battery Voltage: 2.92 V
Brady Statistic AP VP Percent: 24.59 %
Brady Statistic AP VS Percent: 0 %
Brady Statistic AS VP Percent: 75.44 %
Brady Statistic AS VS Percent: 0.01 %
Brady Statistic RA Percent Paced: 11.62 %
Brady Statistic RV Percent Paced: 99.8 %
Date Time Interrogation Session: 20250317014427
Implantable Lead Connection Status: 753985
Implantable Lead Connection Status: 753985
Implantable Lead Implant Date: 20180808
Implantable Lead Implant Date: 20180808
Implantable Lead Location: 753859
Implantable Lead Location: 753860
Implantable Lead Model: 5076
Implantable Lead Model: 5076
Implantable Pulse Generator Implant Date: 20180808
Lead Channel Impedance Value: 266 Ohm
Lead Channel Impedance Value: 342 Ohm
Lead Channel Impedance Value: 361 Ohm
Lead Channel Impedance Value: 456 Ohm
Lead Channel Pacing Threshold Amplitude: 0.875 V
Lead Channel Pacing Threshold Amplitude: 1 V
Lead Channel Pacing Threshold Pulse Width: 0.4 ms
Lead Channel Pacing Threshold Pulse Width: 0.4 ms
Lead Channel Sensing Intrinsic Amplitude: 1.25 mV
Lead Channel Sensing Intrinsic Amplitude: 1.25 mV
Lead Channel Sensing Intrinsic Amplitude: 9.25 mV
Lead Channel Sensing Intrinsic Amplitude: 9.25 mV
Lead Channel Setting Pacing Amplitude: 2 V
Lead Channel Setting Pacing Amplitude: 5 V
Lead Channel Setting Pacing Pulse Width: 0.4 ms
Lead Channel Setting Sensing Sensitivity: 0.9 mV
Zone Setting Status: 755011

## 2023-11-19 DIAGNOSIS — I129 Hypertensive chronic kidney disease with stage 1 through stage 4 chronic kidney disease, or unspecified chronic kidney disease: Secondary | ICD-10-CM | POA: Diagnosis not present

## 2023-11-19 DIAGNOSIS — D631 Anemia in chronic kidney disease: Secondary | ICD-10-CM | POA: Diagnosis not present

## 2023-11-19 DIAGNOSIS — N1832 Chronic kidney disease, stage 3b: Secondary | ICD-10-CM | POA: Diagnosis not present

## 2023-11-19 DIAGNOSIS — N2581 Secondary hyperparathyroidism of renal origin: Secondary | ICD-10-CM | POA: Diagnosis not present

## 2023-11-22 ENCOUNTER — Encounter: Payer: Self-pay | Admitting: Cardiology

## 2023-12-23 DIAGNOSIS — K4090 Unilateral inguinal hernia, without obstruction or gangrene, not specified as recurrent: Secondary | ICD-10-CM | POA: Diagnosis not present

## 2024-01-01 NOTE — Addendum Note (Signed)
 Addended by: Edra Govern D on: 01/01/2024 04:09 PM   Modules accepted: Orders

## 2024-01-01 NOTE — Progress Notes (Signed)
 Remote pacemaker transmission.

## 2024-01-12 ENCOUNTER — Other Ambulatory Visit: Payer: Self-pay | Admitting: Surgery

## 2024-01-12 ENCOUNTER — Telehealth: Payer: Self-pay | Admitting: *Deleted

## 2024-01-12 ENCOUNTER — Telehealth (HOSPITAL_BASED_OUTPATIENT_CLINIC_OR_DEPARTMENT_OTHER): Payer: Self-pay | Admitting: *Deleted

## 2024-01-12 DIAGNOSIS — K4090 Unilateral inguinal hernia, without obstruction or gangrene, not specified as recurrent: Secondary | ICD-10-CM | POA: Diagnosis not present

## 2024-01-12 NOTE — Telephone Encounter (Signed)
 Contacted patient - he is scheduled with Dr. Marven Slimmer on 5/28 at 2pm for device clearance for his hernia surgery, as well as an overdue follow up since he hasn't been seen since 06/2021.

## 2024-01-12 NOTE — Telephone Encounter (Signed)
 error

## 2024-01-12 NOTE — Telephone Encounter (Signed)
 I will send to preop APP as well as I do not see any notes from the preop APP.

## 2024-01-12 NOTE — Telephone Encounter (Signed)
   Pre-operative Risk Assessment    Patient Name: Mario Fisher  DOB: [birth year] MRN: [id number]   Date of last office visit: 09/09/2023 Date of next office visit: None Sent to device clinic.  Request for Surgical Clearance    Procedure:  Hernia Surgery   Date of Surgery:  Clearance TBD                                 Surgeon:  Dr. Oza Blumenthal Surgeon's Group or Practice Name:  [facility] Phone number:  [phone] Fax number:  [phone]   Type of Clearance Requested:   - Medical  - Pharmacy:  Hold Rivaroxaban  (Xarelto ) Not Indicated.   Type of Anesthesia:  LMA & TAP Block   Additional requests/questions:    Signed, Lauris Port   01/12/2024, 12:00 PM

## 2024-01-12 NOTE — Telephone Encounter (Signed)
 I will update all parties involved pt has appt 01/27/24 Dr. Marven Slimmer.

## 2024-01-12 NOTE — Telephone Encounter (Signed)
 Message sent to EP scheduling for in clinic visit as patient has not been seen in office since 06/20/21

## 2024-01-19 NOTE — Telephone Encounter (Signed)
 Patient with diagnosis of afib on Xarelto  for anticoagulation.    Procedure: Hernia Surgery  Date of procedure: TBD   CHA2DS2-VASc Score = 5   This indicates a 7.2% annual risk of stroke. The patient's score is based upon: CHF History: 0 HTN History: 1 Diabetes History: 0 Stroke History: 2 Vascular Disease History: 0 Age Score: 2 Gender Score: 0      CrCl 34 ml/min Platelet count 151  Hx of stroke seen on MRI many years ago  Patient has not had an Afib/aflutter ablation within the last 3 months or DCCV within the last 30 days  Per office protocol, patient can hold Xarelto  for 2 days prior to procedure.    **This guidance is not considered finalized until pre-operative APP has relayed final recommendations.**

## 2024-01-19 NOTE — Telephone Encounter (Signed)
   Name: JAKAI RISSE  DOB: 1934-04-02  MRN: [id number]  Primary Cardiologist: Ahmad Alert, MD  Chart reviewed as part of pre-operative protocol coverage. The patient has an upcoming visit scheduled with Dr. Marven Slimmer on 01/27/2024 at which time clearance can be addressed in case there are any issues that would impact surgical recommendations.  Hernia surgery is not scheduled until TBD as below. I added preop FYI to appointment note so that provider is aware to address at time of outpatient visit.  Per office protocol the cardiology provider should forward their finalized clearance decision and recommendations regarding antiplatelet therapy to the requesting party below.    Per office protocol, patient can hold Xarelto  for 2 days prior to procedure.    I will route this message as FYI to requesting party and remove this message from the preop box as separate preop APP input not needed at this time.   Please call with any questions.  Ava Boatman, NP  01/19/2024, 8:08 AM

## 2024-01-26 ENCOUNTER — Emergency Department (HOSPITAL_BASED_OUTPATIENT_CLINIC_OR_DEPARTMENT_OTHER)
Admission: EM | Admit: 2024-01-26 | Discharge: 2024-01-26 | Disposition: A | Discharge status: Home / Self Care | Attending: Emergency Medicine | Admitting: Emergency Medicine

## 2024-01-26 ENCOUNTER — Other Ambulatory Visit: Payer: Self-pay

## 2024-01-26 ENCOUNTER — Emergency Department (HOSPITAL_BASED_OUTPATIENT_CLINIC_OR_DEPARTMENT_OTHER)

## 2024-01-26 ENCOUNTER — Encounter (HOSPITAL_BASED_OUTPATIENT_CLINIC_OR_DEPARTMENT_OTHER): Payer: Self-pay | Admitting: Urology

## 2024-01-26 DIAGNOSIS — K4090 Unilateral inguinal hernia, without obstruction or gangrene, not specified as recurrent: Secondary | ICD-10-CM | POA: Diagnosis not present

## 2024-01-26 DIAGNOSIS — K4030 Unilateral inguinal hernia, with obstruction, without gangrene, not specified as recurrent: Secondary | ICD-10-CM | POA: Diagnosis not present

## 2024-01-26 DIAGNOSIS — Z7901 Long term (current) use of anticoagulants: Secondary | ICD-10-CM | POA: Diagnosis not present

## 2024-01-26 DIAGNOSIS — R1031 Right lower quadrant pain: Secondary | ICD-10-CM | POA: Diagnosis present

## 2024-01-26 DIAGNOSIS — R188 Other ascites: Secondary | ICD-10-CM | POA: Diagnosis not present

## 2024-01-26 LAB — BASIC METABOLIC PANEL WITH GFR
Anion gap: 13 (ref 5–15)
BUN: 25 mg/dL — ABNORMAL HIGH (ref 8–23)
CO2: 24 mmol/L (ref 22–32)
Calcium: 10 mg/dL (ref 8.9–10.3)
Chloride: 96 mmol/L — ABNORMAL LOW (ref 98–111)
Creatinine, Ser: 1.28 mg/dL — ABNORMAL HIGH (ref 0.61–1.24)
GFR, Estimated: 54 mL/min — ABNORMAL LOW (ref >60)
Glucose, Bld: 116 mg/dL — ABNORMAL HIGH (ref 70–99)
Potassium: 4.1 mmol/L (ref 3.5–5.1)
Sodium: 133 mmol/L — ABNORMAL LOW (ref 135–145)

## 2024-01-26 LAB — CBC WITH DIFFERENTIAL/PLATELET
Abs Immature Granulocytes: 0.03 10*3/uL (ref 0.00–0.07)
Basophils Absolute: 0 10*3/uL (ref 0.0–0.1)
Basophils Relative: 0 %
Eosinophils Absolute: 0 10*3/uL (ref 0.0–0.5)
Eosinophils Relative: 0 %
HCT: 40.2 % (ref 39.0–52.0)
Hemoglobin: 13.8 g/dL (ref 13.0–17.0)
Immature Granulocytes: 1 %
Lymphocytes Relative: 17 %
Lymphs Abs: 0.9 10*3/uL (ref 0.7–4.0)
MCH: 31.9 pg (ref 26.0–34.0)
MCHC: 34.3 g/dL (ref 30.0–36.0)
MCV: 93.1 fL (ref 80.0–100.0)
Monocytes Absolute: 0.4 10*3/uL (ref 0.1–1.0)
Monocytes Relative: 8 %
Neutro Abs: 3.9 10*3/uL (ref 1.7–7.7)
Neutrophils Relative %: 74 %
Platelets: 203 10*3/uL (ref 150–400)
RBC: 4.32 MIL/uL (ref 4.22–5.81)
RDW: 14 % (ref 11.5–15.5)
WBC: 5.3 10*3/uL (ref 4.0–10.5)
nRBC: 0 % (ref 0.0–0.2)

## 2024-01-26 NOTE — Discharge Instructions (Addendum)
 Please refer to the attached instructions. Complete visit with cardiology as scheduled for tomorrow. If you develop persistent nausea with vomiting, or are unable to tolerate oral intake, have unremitting abdominal pain, or are unable to reduce the hernia on your own, return to the ED.

## 2024-01-26 NOTE — ED Triage Notes (Signed)
 Pt states has a hernia on right side of lower groin  States increase in pain since last night   Has spoke with surgeon, waiting to hear from cardiology prior to surgery scheduling

## 2024-01-26 NOTE — ED Provider Notes (Signed)
 89 year old male with known right inguinal hernia here with increased hernia pain.  CT showing incarcerated hernia with signs of obstruction.  Asked to see if I can do a bedside reduction.  Hernia reduction  Date/Time: 01/26/2024 5:04 PM  Performed by: Tonya Fredrickson, MD Authorized by: Tonya Fredrickson, MD  Consent: Verbal consent obtained. Consent given by: patient Patient understanding: patient states understanding of the procedure being performed Patient consent: the patient's understanding of the procedure matches consent given Patient identity confirmed: verbally with patient Local anesthesia used: no  Anesthesia: Local anesthesia used: no  Sedation: Patient sedated: no  Patient tolerance: patient tolerated the procedure well with no immediate complications Comments: Reduced with gentle pressure.  Patient tolerated well.   Right inguinal hernia    Tonya Fredrickson, MD 01/27/24 1030

## 2024-01-26 NOTE — ED Provider Notes (Signed)
 [name] Provider Note   CSN: [id number] Arrival date & time: 01/26/24  1152     History  Chief Complaint  Patient presents with   Hernia    Mario Fisher is a 89 y.o. male.  Pleasant 89 year old gentleman with known right sided abdominal wall hernia. Developed significant pain yesterday. Pain 8/10 at worst, currently 5/10. Noticeable bulge in lower aspect of RLQ. Hernia easily reduced, but patient states pain persists. He has seen general surgery, awaiting cardiology clearance.  The history is provided by the patient and medical records. No language interpreter was used.  Groin Pain This is a recurrent problem. The current episode started 6 to 12 hours ago. The problem has been gradually improving.       Home Medications Prior to Admission medications   Medication Sig Start Date End Date Taking? Authorizing Provider  allopurinol  (ZYLOPRIM ) 100 MG tablet Take 100 mg by mouth daily. 09/26/21   [provider]  cyanocobalamin  (,VITAMIN B-12,) 1000 MCG/ML injection Inject 1,000 mcg into the muscle every 30 (thirty) days.    [provider]  dorzolamide  (TRUSOPT ) 2 % ophthalmic solution Place 1 drop into both eyes 2 (two) times daily. 05/11/17   [provider]  Glucosamine-Chondroit-Vit C-Mn (GLUCOSAMINE CHONDR 1500 COMPLX PO) Take 1 tablet by mouth daily.    [provider]  hydrochlorothiazide  (MICROZIDE ) 12.5 MG capsule Take 1 capsule by mouth once daily 08/06/23   Nahser, Lela Purple, MD  hydroxychloroquine (PLAQUENIL) 200 MG tablet Take 200 mg by mouth 2 (two) times daily. 09/03/23   [provider]  Multiple Vitamins-Minerals (MULTIVITAMIN ADULTS 50+ PO) Take 1 tablet by mouth daily.    [provider]  Omega-3 Fatty Acids (FISH OIL) 1200 MG CAPS Take 1,200-2,400 mg by mouth See admin instructions. Take 2400 mg by mouth in the morning and take 1200 mg by mouth at bedtime     [provider]  Propylene Glycol (SYSTANE BALANCE) 0.6 % SOLN Place 1 drop into both eyes in the morning and at bedtime.    [provider]  Rivaroxaban  (XARELTO ) 15 MG TABS tablet TAKE 1 TABLET BY MOUTH ONCE DAILY WITH SUPPER 08/20/23   Nahser, Lela Purple, MD  Saccharomyces boulardii (PROBIOTIC) 250 MG CAPS Take by mouth.    [provider]  tamsulosin  (FLOMAX ) 0.4 MG CAPS capsule Take 0.4 mg by mouth daily.    [provider]  valsartan  (DIOVAN ) 80 MG tablet Take 40-80 mg by mouth daily. Patient checks BP and if its low will only take 1/2 tab = 40mg     [provider]      Allergies    Statins, Upadacitinib , and Enbrel [etanercept]    Review of Systems   Review of Systems  Constitutional:  Negative for chills and fever.  Gastrointestinal:  Positive for nausea. Negative for constipation, diarrhea and vomiting.  Genitourinary:  Negative for difficulty urinating.  All other systems reviewed and are negative.   Physical Exam Updated Vital Signs BP (!) 177/106 (BP Location: Left Arm)   Pulse 78   Temp 97.7 F (36.5 C) (Oral)   Resp 17   Ht 5' (1.524 m)   Wt 75.8 kg   SpO2 100%   BMI 32.64 kg/m  Physical Exam Constitutional:      Appearance: Normal appearance.  HENT:     Head: Normocephalic.     Nose: Nose normal.     Mouth/Throat:  Mouth: Mucous membranes are moist.  Eyes:     Conjunctiva/sclera: Conjunctivae normal.  Cardiovascular:     Rate and Rhythm: Normal rate.  Pulmonary:     Effort: Pulmonary effort is normal.  Abdominal:     Palpations: Abdomen is soft.     Hernia: A hernia is present.  Musculoskeletal:        General: Normal range of motion.  Skin:    General: Skin is warm and dry.  Neurological:     Mental Status: He is alert and oriented to person, place, and time.  Psychiatric:        Mood and Affect: Mood normal.        Behavior: Behavior normal.    ED Results / Procedures / Treatments   Labs (all  labs ordered are listed, but only abnormal results are displayed) Labs Reviewed - No data to display  EKG None  Radiology CT ABDOMEN PELVIS WO CONTRAST Result Date: 01/26/2024 CLINICAL DATA:  Right groin hernia suspected. EXAM: CT ABDOMEN AND PELVIS WITHOUT CONTRAST TECHNIQUE: Multidetector CT imaging of the abdomen and pelvis was performed following the standard protocol without IV contrast. RADIATION DOSE REDUCTION: This exam was performed according to the departmental dose-optimization program which includes automated exposure control, adjustment of the mA and/or kV according to patient size and/or use of iterative reconstruction technique. COMPARISON:  CT abdomen pelvis dated 11/03/2021. FINDINGS: Evaluation of this exam is limited in the absence of intravenous contrast. Lower chest: No acute findings. No intra-abdominal free air or free fluid. Hepatobiliary: The liver is unremarkable. No biliary ductal dilatation. Cholecystectomy. Pancreas: Unremarkable. No pancreatic ductal dilatation or surrounding inflammatory changes. Spleen: Normal in size without focal abnormality. Adrenals/Urinary Tract: The adrenal glands unremarkable. There is no hydronephrosis or nephrolithiasis on either side. The visualized ureters and urinary bladder appear unremarkable. Stomach/Bowel: There is herniation of a segment of distal small bowel into the right inguinal canal. There is narrowing of the bowel at the neck of the hernia. There is fecalization of the herniated bowel segment. Mildly dilated small bowel loops proximal to the hernia measure up to 3 cm. Small amount of fluid within the inguinal canal. The appendix is normal. Vascular/Lymphatic: Mild aortoiliac atherosclerotic disease. The IVC is unremarkable. No portal venous gas. There is no adenopathy. Reproductive: Enlarged prostate gland measuring 6 cm in transverse axial diameter. Other: None Musculoskeletal: Degenerative changes of the spine and scoliosis. No acute  osseous pathology. IMPRESSION: 1. Herniation of a segment of distal small bowel into the right inguinal canal with evidence of obstruction. Surgical consult is advised. 2. No hydronephrosis or nephrolithiasis. 3. Enlarged prostate gland. 4.  Aortic Atherosclerosis (ICD10-I70.0). Electronically Signed   By: Angus Bark M.D.   On: 01/26/2024 16:23    Procedures Procedures    Medications Ordered in ED Medications - No data to display  ED Course/ Medical Decision Making/ A&P  Consult with general surgery ([facility]). If hernia is reducible and patient is tolerating oral intake, patient may be discharged home with continuing preparation for medical clearance by cardiology for surgery. If not reducible, transfer patient to [facility] for surgical consult.  Discussed with Dr. Randal Bury, and patient seen by Dr. Randal Bury. Dr. Randal Bury was successful in reducing the hernia. Patient tolerated well.                               Medical Decision Making Amount and/or Complexity of Data Reviewed Labs:  ordered. Radiology: ordered.   Concern for obstruction noted on CT. Surgery consulted. See recommendations above.  Hernia sac soft. No vomiting. No changes to stooling pattern. No skin changes overlying hernia sac. Hernia was reduced in the ED. Patient is awaiting clearance from cardiology(appointment tomorrow) for planned surgical repair. Care instructions provided and return precautions discussed with appropriate understanding.         Final Clinical Impression(s) / ED Diagnoses Final diagnoses:  Inguinal hernia of right side with obstruction    Rx / DC Orders ED Discharge Orders     None         Gigi Kyle, NP 01/26/24 2203    Roberts Ching, MD 01/27/24 [phone]

## 2024-01-27 ENCOUNTER — Ambulatory Visit: Attending: Cardiology | Admitting: Cardiology

## 2024-01-27 ENCOUNTER — Other Ambulatory Visit: Payer: Self-pay | Admitting: Cardiovascular Disease

## 2024-01-27 ENCOUNTER — Encounter: Payer: Self-pay | Admitting: Cardiology

## 2024-01-27 VITALS — BP 90/56 | HR 68 | Ht 60.0 in | Wt 164.0 lb

## 2024-01-27 DIAGNOSIS — I10 Essential (primary) hypertension: Secondary | ICD-10-CM | POA: Diagnosis not present

## 2024-01-27 DIAGNOSIS — I495 Sick sinus syndrome: Secondary | ICD-10-CM

## 2024-01-27 DIAGNOSIS — Z950 Presence of cardiac pacemaker: Secondary | ICD-10-CM

## 2024-01-27 DIAGNOSIS — I4819 Other persistent atrial fibrillation: Secondary | ICD-10-CM | POA: Diagnosis not present

## 2024-01-27 LAB — CUP PACEART INCLINIC DEVICE CHECK
Battery Remaining Longevity: 43 mo
Battery Voltage: 2.93 V
Brady Statistic AP VP Percent: 22.13 %
Brady Statistic AP VS Percent: 24.47 %
Brady Statistic AS VP Percent: 27.15 %
Brady Statistic AS VS Percent: 26.01 %
Brady Statistic RA Percent Paced: 5.18 %
Brady Statistic RV Percent Paced: 97.11 %
Date Time Interrogation Session: 20250528150317
Implantable Lead Connection Status: 753985
Implantable Lead Connection Status: 753985
Implantable Lead Implant Date: 20180808
Implantable Lead Implant Date: 20180808
Implantable Lead Location: 753859
Implantable Lead Location: 753860
Implantable Lead Model: 5076
Implantable Lead Model: 5076
Implantable Pulse Generator Implant Date: 20180808
Lead Channel Impedance Value: 304 Ohm
Lead Channel Impedance Value: 361 Ohm
Lead Channel Impedance Value: 437 Ohm
Lead Channel Impedance Value: 532 Ohm
Lead Channel Pacing Threshold Amplitude: 0.875 V
Lead Channel Pacing Threshold Amplitude: 1 V
Lead Channel Pacing Threshold Pulse Width: 0.4 ms
Lead Channel Pacing Threshold Pulse Width: 0.4 ms
Lead Channel Sensing Intrinsic Amplitude: 1.375 mV
Lead Channel Sensing Intrinsic Amplitude: 10.375 mV
Lead Channel Sensing Intrinsic Amplitude: 10.875 mV
Lead Channel Sensing Intrinsic Amplitude: 2.125 mV
Lead Channel Setting Pacing Amplitude: 2 V
Lead Channel Setting Pacing Amplitude: 3.5 V
Lead Channel Setting Pacing Pulse Width: 0.4 ms
Lead Channel Setting Sensing Sensitivity: 0.9 mV
Zone Setting Status: 755011

## 2024-01-27 NOTE — Progress Notes (Signed)
 Electrophysiology Office Follow up Visit Note:    Date:  01/27/2024   ID:  Mario Fisher, DOB September 23, 1933, MRN [id number]  PCP:  Alejandro Hurt, FNP  [facility] Cardiologist:  Ahmad Alert, MD  [facility] Electrophysiologist:  Boyce Byes, MD    Interval History:     Mario Fisher is a 89 y.o. male who presents for a follow up visit.   The patient was previously followed by Dr. Nunzio Belch.  The patient last saw Dr. Alroy Aspen September 09, 2023.  He has a history of sick sinus syndrome with a permanent pacemaker in situ, hypertension, hyperlipidemia, ankylosing spondylolysis, rheumatoid arthritis, CKD, OSA, prior stroke.  He has a CHA2DS2-VASc of 6.  He has an upcoming inguinal hernia repair.  He was in the emergency department yesterday with his hernia.  They had to manually reduce the hernia.  During that time his systolic blood pressure was elevated because of pain.  He is with his wife today in clinic.  He likes to stay active.  He does work around [employer] and yard.  He is planning to have his hernia repaired given severe pain and limited activities.  He takes Xarelto  without bleeding issues.       Past medical, surgical, social and family history were reviewed.  ROS:   Please see the history of present illness.    All other systems reviewed and are negative.  EKGs/Labs/Other Studies Reviewed:    The following studies were reviewed today:  Jan 27, 2024 in clinic device interrogation personally reviewed 100% A-fib when taking into account atrial under sensing of atrial fibrillation Reprogrammed to VVIR 60 today Lead parameter stable  November 04, 2021 echo EF 50% RV normal Dilated left and right atrium Trivial MR        Physical Exam:    VS:  BP (!) 90/56 (BP Location: Left Arm, Patient Position: Sitting, Cuff Size: Normal)   Pulse 68   Ht 5' (1.524 m)   Wt 164 lb (74.4 kg)   SpO2 97%   BMI 32.03 kg/m     Wt Readings from Last 3 Encounters:   01/27/24 164 lb (74.4 kg)  01/26/24 167 lb 1.7 oz (75.8 kg)  09/09/23 167 lb 3.2 oz (75.8 kg)     GEN: no distress CARD: RRR, No MRG.  CIED pocket well-healed RESP: No IWOB. CTAB.      ASSESSMENT:    1. Persistent atrial fibrillation (HCC)   2. Sick sinus syndrome (HCC)   3. Cardiac pacemaker in situ   4. Primary hypertension    PLAN:    In order of problems listed above:  #Symptomatic bradycardia #Permanent pacemaker in situ Device functioning appropriately.  Continue remote monitoring.  # Permanent atrial fibrillation Anticoagulation management as below  #Hypertension At goal today.  Recommend checking blood pressures 1-2 times per week at home and recording the values.  Recommend bringing these recordings to the primary care physician.  He should only take valsartan  40 mg by mouth once daily.  He should only take this medication if his systolic blood pressure is greater than 130 mmHg.  #Preop risk stratification The patient is at acceptable risk to undergo noncardiac surgery.  I do not think any additional testing is indicated at this time.  It is reasonable for him to hold his Xarelto  for 2 days prior to the procedure and restart once felt safe from a surgical perspective.  Risks of interrupting anticoagulation were discussed with the patient  during today's office visit.  Follow-up 1 year with APP  Signed, Harvie Liner, MD, [facility], [facility] 01/27/2024 2:10 PM    Electrophysiology [facility]

## 2024-01-27 NOTE — Progress Notes (Signed)
 PERIOPERATIVE PRESCRIPTION FOR IMPLANTED CARDIAC DEVICE PROGRAMMING  Patient Information: Name:  Mario Fisher  DOB:  1933/10/29  MRN:  [id number]  Procedure:  Hernia Surgery     Date of Surgery:  Clearance TBD                                  Surgeon:  Dr. Oza Blumenthal Surgeon's Group or Practice Name:  [facility] Phone number:  [phone] Fax number:  [phone]   Type of Clearance Requested:   - Medical  - Pharmacy:  Hold Rivaroxaban  (Xarelto ) Not Indicated.   Type of Anesthesia:  LMA & TAP Block  Device Information:  Clinic EP Physician:  Dr. Harvie Liner  Device Type:  Pacemaker Manufacturer and Phone #:  Medtronic: [id number] Pacemaker Dependent?:  No. Date of Last Device Check:  01/27/2024 Normal Device Function?:  Yes.    Electrophysiologist's Recommendations:  Have magnet available. Provide continuous ECG monitoring when magnet is used or reprogramming is to be performed.  Procedure may interfere with device function.  Magnet should be placed over device during procedure.  Per Device Clinic Standing Orders, Forrestine Ike, RN  3:18 PM 01/27/2024

## 2024-01-27 NOTE — Patient Instructions (Signed)

## 2024-01-27 NOTE — Telephone Encounter (Signed)
 Pt last saw Dr Alroy Aspen 09/09/23, last labs 01/26/24 Creat 1.28, age 89, weight 75.8kg, CrCl 42.77, based on CrCl pt is on appropriate dosage of Xarelto  15mg  every day for afib.  Will refill rx.

## 2024-01-28 ENCOUNTER — Encounter: Payer: Self-pay | Admitting: Cardiology

## 2024-01-28 NOTE — Telephone Encounter (Signed)
 I will send a message to the preop APP to review if the pt has been cleared

## 2024-01-31 ENCOUNTER — Ambulatory Visit: Payer: Self-pay | Admitting: Cardiology

## 2024-02-15 ENCOUNTER — Ambulatory Visit: Payer: Medicare Other

## 2024-02-15 DIAGNOSIS — I4819 Other persistent atrial fibrillation: Secondary | ICD-10-CM | POA: Diagnosis not present

## 2024-02-15 LAB — CUP PACEART REMOTE DEVICE CHECK
Battery Remaining Longevity: 45 mo
Battery Voltage: 2.94 V
Brady Statistic RA Percent Paced: 0 %
Brady Statistic RV Percent Paced: 99.98 %
Date Time Interrogation Session: 20250616014423
Implantable Lead Connection Status: 753985
Implantable Lead Connection Status: 753985
Implantable Lead Implant Date: 20180808
Implantable Lead Implant Date: 20180808
Implantable Lead Location: 753859
Implantable Lead Location: 753860
Implantable Lead Model: 5076
Implantable Lead Model: 5076
Implantable Pulse Generator Implant Date: 20180808
Lead Channel Impedance Value: 266 Ohm
Lead Channel Impedance Value: 323 Ohm
Lead Channel Impedance Value: 361 Ohm
Lead Channel Impedance Value: 456 Ohm
Lead Channel Pacing Threshold Amplitude: 0.875 V
Lead Channel Pacing Threshold Amplitude: 1 V
Lead Channel Pacing Threshold Pulse Width: 0.4 ms
Lead Channel Pacing Threshold Pulse Width: 0.4 ms
Lead Channel Sensing Intrinsic Amplitude: 1.25 mV
Lead Channel Sensing Intrinsic Amplitude: 1.25 mV
Lead Channel Sensing Intrinsic Amplitude: 10.375 mV
Lead Channel Sensing Intrinsic Amplitude: 10.875 mV
Lead Channel Setting Pacing Amplitude: 2 V
Lead Channel Setting Pacing Pulse Width: 0.4 ms
Lead Channel Setting Sensing Sensitivity: 0.9 mV
Zone Setting Status: 755011
Zone Setting Status: 755011

## 2024-02-16 ENCOUNTER — Ambulatory Visit: Payer: Self-pay | Admitting: Cardiology

## 2024-02-23 DIAGNOSIS — H401132 Primary open-angle glaucoma, bilateral, moderate stage: Secondary | ICD-10-CM | POA: Diagnosis not present

## 2024-03-10 ENCOUNTER — Encounter: Payer: Self-pay | Admitting: Cardiology

## 2024-03-11 NOTE — Pre-Procedure Instructions (Signed)
 Surgical Instructions   Your procedure is scheduled on March 17, 2024. Report to [facility] Main Entrance A at 5:30 A.M., then check in with the Admitting office. Any questions or running late day of surgery: call [phone]  Questions prior to your surgery date: call [phone], Monday-Friday, 8am-4pm. If you experience any cold or flu symptoms such as cough, fever, chills, shortness of breath, etc. between now and your scheduled surgery, please notify us  at the above number.     Remember:  Do not eat after midnight the night before your surgery   You may drink clear liquids until 4:30 AM the morning of your surgery.   Clear liquids allowed are: Water, Non-Citrus Juices (without pulp), Carbonated Beverages, Clear Tea (no milk, honey, etc.), Black Coffee Only (NO MILK, CREAM OR POWDERED CREAMER of any kind), and Gatorade.  Patient Instructions  The night before surgery:  No food after midnight. ONLY clear liquids after midnight  The day of surgery (if you do NOT have diabetes):  Drink ONE (1) Pre-Surgery Clear Ensure by 4:30 AM the morning of surgery. Drink in one sitting. Do not sip.  This drink was given to you during your hospital  pre-op appointment visit.  Nothing else to drink after completing the  Pre-Surgery Clear Ensure.         If you have questions, please contact your surgeon's office.   Take these medicines the morning of surgery with A SIP OF WATER: allopurinol  (ZYLOPRIM )  dorzolamide  (TRUSOPT ) ophthalmic solution  timolol (TIMOPTIC) ophthalmic solution    May take these medicines IF NEEDED: acetaminophen  (TYLENOL )  Propylene Glycol (SYSTANE BALANCE) eye drops   Please follow your prescribing physician's instructions on if/when to stop taking your hydroxychloroquine (PLAQUENIL).  Follow your surgeon's instructions on when to stop Rivaroxaban  (XARELTO ).  If no instructions were given by your surgeon then you will need to call the office to get those  instructions.     One week prior to surgery, STOP taking any Aspirin (unless otherwise instructed by your surgeon) Aleve, Naproxen, Ibuprofen, Motrin, Advil, Goody's, BC's, all herbal medications, fish oil, and non-prescription vitamins.                     Do NOT Smoke (Tobacco/Vaping) for 24 hours prior to your procedure.  If you use a CPAP at night, you may bring your mask/headgear for your overnight stay.   You will be asked to remove any contacts, glasses, piercing's, hearing aid's, dentures/partials prior to surgery. Please bring cases for these items if needed.    Patients discharged the day of surgery will not be allowed to drive home, and someone needs to stay with them for 24 hours.  SURGICAL WAITING ROOM VISITATION Patients may have no more than 2 support people in the waiting area - these visitors may rotate.   Pre-op nurse will coordinate an appropriate time for 1 ADULT support person, who may not rotate, to accompany patient in pre-op.  Children under the age of 28 must have an adult with them who is not the patient and must remain in the main waiting area with an adult.  If the patient needs to stay at the hospital during part of their recovery, the visitor guidelines for inpatient rooms apply.  Please refer to the [facility] website for the visitor guidelines for any additional information.   If you received a COVID test during your pre-op visit  it is requested that you wear a mask when out  in public, stay away from anyone that may not be feeling well and notify your surgeon if you develop symptoms. If you have been in contact with anyone that has tested positive in the last 10 days please notify you surgeon.      Pre-operative CHG Bathing Instructions   You can play a key role in reducing the risk of infection after surgery. Your skin needs to be as free of germs as possible. You can reduce the number of germs on your skin by washing with CHG (chlorhexidine   gluconate) soap before surgery. CHG is an antiseptic soap that kills germs and continues to kill germs even after washing.   DO NOT use if you have an allergy to chlorhexidine /CHG or antibacterial soaps. If your skin becomes reddened or irritated, stop using the CHG and notify one of our RNs at [phone].              TAKE A SHOWER THE NIGHT BEFORE SURGERY AND THE DAY OF SURGERY    Please keep in mind the following:  DO NOT shave, including legs and underarms, 48 hours prior to surgery.   You may shave your face before/day of surgery.  Place clean sheets on your bed the night before surgery Use a clean washcloth (not used since being washed) for each shower. DO NOT sleep with pet's night before surgery.  CHG Shower Instructions:  Wash your face and private area with normal soap. If you choose to wash your hair, wash first with your normal shampoo.  After you use shampoo/soap, rinse your hair and body thoroughly to remove shampoo/soap residue.  Turn the water OFF and apply half the bottle of CHG soap to a CLEAN washcloth.  Apply CHG soap ONLY FROM YOUR NECK DOWN TO YOUR TOES (washing for 3-5 minutes)  DO NOT use CHG soap on face, private areas, open wounds, or sores.  Pay special attention to the area where your surgery is being performed.  If you are having back surgery, having someone wash your back for you may be helpful. Wait 2 minutes after CHG soap is applied, then you may rinse off the CHG soap.  Pat dry with a clean towel  Put on clean pajamas    Additional instructions for the day of surgery: DO NOT APPLY any lotions, deodorants, cologne, or perfumes.   Do not wear jewelry or makeup Do not wear nail polish, gel polish, artificial nails, or any other type of covering on natural nails (fingers and toes) Do not bring valuables to the hospital. [facility] is not responsible for valuables/personal belongings. Put on clean/comfortable clothes.  Please brush your teeth.  Ask your  nurse before applying any prescription medications to the skin.

## 2024-03-14 ENCOUNTER — Other Ambulatory Visit: Payer: Self-pay

## 2024-03-14 ENCOUNTER — Encounter (HOSPITAL_COMMUNITY): Payer: Self-pay

## 2024-03-14 ENCOUNTER — Encounter (HOSPITAL_COMMUNITY)
Admission: RE | Admit: 2024-03-14 | Discharge: 2024-03-14 | Disposition: A | Discharge status: Home / Self Care | Source: Ambulatory Visit | Attending: Surgery | Admitting: Surgery

## 2024-03-14 VITALS — BP 152/97 | HR 87 | Temp 97.6°F | Resp 18 | Ht 64.0 in | Wt 165.0 lb

## 2024-03-14 DIAGNOSIS — Z8673 Personal history of transient ischemic attack (TIA), and cerebral infarction without residual deficits: Secondary | ICD-10-CM | POA: Diagnosis not present

## 2024-03-14 DIAGNOSIS — I2510 Atherosclerotic heart disease of native coronary artery without angina pectoris: Secondary | ICD-10-CM | POA: Insufficient documentation

## 2024-03-14 DIAGNOSIS — Z01812 Encounter for preprocedural laboratory examination: Secondary | ICD-10-CM | POA: Insufficient documentation

## 2024-03-14 DIAGNOSIS — G4733 Obstructive sleep apnea (adult) (pediatric): Secondary | ICD-10-CM | POA: Diagnosis not present

## 2024-03-14 DIAGNOSIS — M069 Rheumatoid arthritis, unspecified: Secondary | ICD-10-CM | POA: Diagnosis not present

## 2024-03-14 DIAGNOSIS — Z950 Presence of cardiac pacemaker: Secondary | ICD-10-CM | POA: Insufficient documentation

## 2024-03-14 DIAGNOSIS — N183 Chronic kidney disease, stage 3 unspecified: Secondary | ICD-10-CM | POA: Diagnosis not present

## 2024-03-14 DIAGNOSIS — I4821 Permanent atrial fibrillation: Secondary | ICD-10-CM | POA: Insufficient documentation

## 2024-03-14 DIAGNOSIS — I129 Hypertensive chronic kidney disease with stage 1 through stage 4 chronic kidney disease, or unspecified chronic kidney disease: Secondary | ICD-10-CM | POA: Insufficient documentation

## 2024-03-14 DIAGNOSIS — I495 Sick sinus syndrome: Secondary | ICD-10-CM | POA: Insufficient documentation

## 2024-03-14 DIAGNOSIS — Z7901 Long term (current) use of anticoagulants: Secondary | ICD-10-CM | POA: Diagnosis not present

## 2024-03-14 HISTORY — DX: Chronic kidney disease, unspecified: N18.9

## 2024-03-14 HISTORY — DX: Prediabetes: R73.03

## 2024-03-14 HISTORY — DX: Malignant (primary) neoplasm, unspecified: C80.1

## 2024-03-14 HISTORY — DX: Cardiac arrhythmia, unspecified: I49.9

## 2024-03-14 LAB — CBC
HCT: 37 % — ABNORMAL LOW (ref 39.0–52.0)
Hemoglobin: 12.5 g/dL — ABNORMAL LOW (ref 13.0–17.0)
MCH: 32.4 pg (ref 26.0–34.0)
MCHC: 33.8 g/dL (ref 30.0–36.0)
MCV: 95.9 fL (ref 80.0–100.0)
Platelets: 169 K/uL (ref 150–400)
RBC: 3.86 MIL/uL — ABNORMAL LOW (ref 4.22–5.81)
RDW: 14.6 % (ref 11.5–15.5)
WBC: 4 K/uL (ref 4.0–10.5)
nRBC: 0 % (ref 0.0–0.2)

## 2024-03-14 LAB — BASIC METABOLIC PANEL WITH GFR
Anion gap: 9 (ref 5–15)
BUN: 22 mg/dL (ref 8–23)
CO2: 25 mmol/L (ref 22–32)
Calcium: 9.5 mg/dL (ref 8.9–10.3)
Chloride: 101 mmol/L (ref 98–111)
Creatinine, Ser: 1.34 mg/dL — ABNORMAL HIGH (ref 0.61–1.24)
GFR, Estimated: 51 mL/min — ABNORMAL LOW (ref >60)
Glucose, Bld: 110 mg/dL — ABNORMAL HIGH (ref 70–99)
Potassium: 4 mmol/L (ref 3.5–5.1)
Sodium: 135 mmol/L (ref 135–145)

## 2024-03-14 NOTE — Progress Notes (Signed)
 PCP - Prentice Batch, FNP Cardiologist - Dr. Aleene Passe - last office visit 09/09/2023 Electrophysiologist - Dr. Ole Holts  PPM/ICD - Medtronic PPM Device Orders - Device orders requested and received 03/11/24 Rep Notified - [employer] rep emailed 7/14 with surgery, surgery date/time, patient arrival time, surgeon and device orders.  Chest x-ray - n/a EKG - 09/09/2023 Stress Test - Denies ECHO - 11/04/2021 Cardiac Cath - Denies  Sleep Study - +OSA. Pt wears CPAP nightly. He is not aware of pressure settings  No DM  Last dose of GLP1 agonist- n/a GLP1 instructions: n/a  Blood Thinner Instructions: Pt instructed to hold Xarelto  for 2 days. Last dose will be today, July 14th. Aspirin Instructions: n/a  ERAS Protcol - Clear liquids until 0430 morning of surgery PRE-SURGERY Ensure or G2- Ensure given to pt with instructions  COVID TEST- n/a   Anesthesia review: Yes. Cardiac clearance (HTN, SSS s/p Medtronic PPM), OSA   Patient denies shortness of breath, fever, cough and chest pain at PAT appointment. Pt denies any respiratory illness/infection in the last two months.   All instructions explained to the patient, with a verbal understanding of the material. Patient agrees to go over the instructions while at home for a better understanding. Patient also instructed to self quarantine after being tested for COVID-19. The opportunity to ask questions was provided.

## 2024-03-15 NOTE — Anesthesia Preprocedure Evaluation (Signed)
 Anesthesia Evaluation  Patient identified by MRN, date of birth, ID band Patient awake    Reviewed: Allergy & Precautions, H&P , NPO status , Patient's Chart, lab work & pertinent test results  Airway Mallampati: II   Neck ROM: full    Dental   Pulmonary sleep apnea , former smoker   breath sounds clear to auscultation       Cardiovascular hypertension, + dysrhythmias + pacemaker  Rhythm:regular Rate:Normal     Neuro/Psych    GI/Hepatic hiatal hernia,,,  Endo/Other    Renal/GU Renal InsufficiencyRenal disease     Musculoskeletal  (+) Arthritis ,    Abdominal   Peds  Hematology   Anesthesia Other Findings   Reproductive/Obstetrics                              Anesthesia Physical Anesthesia Plan  ASA: 3  Anesthesia Plan: General   Post-op Pain Management: Regional block*   Induction: Intravenous  PONV Risk Score and Plan: 2 and Ondansetron , Dexamethasone  and Treatment may vary due to age or medical condition  Airway Management Planned: LMA  Additional Equipment:   Intra-op Plan:   Post-operative Plan: Extubation in OR  Informed Consent: I have reviewed the patients History and Physical, chart, labs and discussed the procedure including the risks, benefits and alternatives for the proposed anesthesia with the patient or authorized representative who has indicated his/her understanding and acceptance.     Dental advisory given  Plan Discussed with: CRNA, Anesthesiologist and Surgeon  Anesthesia Plan Comments: (PAT note by Lynwood Hope, PA-C:  89 year old male follows with cardiology for history of sick sinus syndrome s/p Medtronic PPM, OSA on CPAP, HTN, HLD, permanent A-fib on Xarelto , prior stroke.  Echo 10/2021 showed EF 50 to 55%, normal wall motion, normal RV, mildly elevated PASP 42.7 mmHg, no significant valvular abnormalities.  Last seen by Dr. Cindie on 01/19/2024.   Upcoming surgery discussed.  Per note, The patient is at acceptable risk to undergo noncardiac surgery.  I do not think any additional testing is indicated at this time.  It is reasonable for him to hold his Xarelto  for 2 days prior to the procedure and restart once felt safe from a surgical perspective.  Risks of interrupting anticoagulation were discussed with the patient during today's office visit.  Patient reports last dose Xarelto  03/14/2024.  Other pertinent history includes CKD 3, hiatal hernia, rheumatoid arthritis on hydroxychloroquine.  Preop labs reviewed, creatinine mildly elevated 1.35, mild anemia hemoglobin 12.5, otherwise unremarkable.  EKG 09/09/2023: Ventricular paced rhythm with underlying A-fib.  Rate 67.  Perioperative prescription for implanted cardiac device programming per progress note 01/27/2024: Device Information:  Clinic EP Physician:  Dr. Ole Cindie  Device Type:  Pacemaker Manufacturer and Phone #:  Medtronic: [phone] Pacemaker Dependent?:  No. Date of Last Device Check:  01/27/2024       Normal Device Function?:  Yes.    Electrophysiologist's Recommendations:   Have magnet available.  Provide continuous ECG monitoring when magnet is used or reprogramming is to be performed.   Procedure may interfere with device function.  Magnet should be placed over device during procedure  TTE 11/04/2021: 1. Left ventricular ejection fraction, by estimation, is 50 to 55%. The  left ventricle has low normal function. The left ventricle has no regional  wall motion abnormalities. Left ventricular diastolic parameters are  indeterminate.  2. Right ventricular systolic function is normal. The right ventricular  size is normal. There is mildly elevated pulmonary artery systolic  pressure. The estimated right ventricular systolic pressure is 42.7 mmHg.  3. Left atrial size was moderately dilated.  4. Right atrial size was mildly dilated.  5. The  mitral valve is normal in structure. Trivial mitral valve  regurgitation. No evidence of mitral stenosis.  6. The aortic valve is tricuspid. There is mild calcification of the  aortic valve. Aortic valve regurgitation is not visualized. Aortic valve  sclerosis/calcification is present, without any evidence of aortic  stenosis.  7. The inferior vena cava is dilated in size with <50% respiratory  variability, suggesting right atrial pressure of 15 mmHg.  8. Negative bubble study, no PFO or ASD.  9. The patient was in atrial fibrillation.    )         Anesthesia Quick Evaluation

## 2024-03-15 NOTE — Progress Notes (Signed)
 Anesthesia Chart Review:  89 year old male follows with cardiology for history of sick sinus syndrome s/p Medtronic PPM, OSA on CPAP, HTN, HLD, permanent A-fib on Xarelto , prior stroke.  Echo 10/2021 showed EF 50 to 55%, normal wall motion, normal RV, mildly elevated PASP 42.7 mmHg, no significant valvular abnormalities.  Last seen by Dr. Cindie on 01/19/2024.  Upcoming surgery discussed.  Per note, The patient is at acceptable risk to undergo noncardiac surgery.  I do not think any additional testing is indicated at this time.  It is reasonable for him to hold his Xarelto  for 2 days prior to the procedure and restart once felt safe from a surgical perspective.  Risks of interrupting anticoagulation were discussed with the patient during today's office visit.  Patient reports last dose Xarelto  03/14/2024.  Other pertinent history includes CKD 3, hiatal hernia, rheumatoid arthritis on hydroxychloroquine.  Preop labs reviewed, creatinine mildly elevated 1.35, mild anemia hemoglobin 12.5, otherwise unremarkable.  EKG 09/09/2023: Ventricular paced rhythm with underlying A-fib.  Rate 67.  Perioperative prescription for implanted cardiac device programming per progress note 01/27/2024: Device Information:   Clinic EP Physician:  Dr. Ole Cindie   Device Type:  Pacemaker Manufacturer and Phone #:  Medtronic: [phone] Pacemaker Dependent?:  No. Date of Last Device Check:  01/27/2024       Normal Device Function?:  Yes.     Electrophysiologist's Recommendations:   Have magnet available. Provide continuous ECG monitoring when magnet is used or reprogramming is to be performed.  Procedure may interfere with device function.  Magnet should be placed over device during procedure  TTE 11/04/2021: 1. Left ventricular ejection fraction, by estimation, is 50 to 55%. The  left ventricle has low normal function. The left ventricle has no regional  wall motion abnormalities. Left ventricular diastolic  parameters are  indeterminate.   2. Right ventricular systolic function is normal. The right ventricular  size is normal. There is mildly elevated pulmonary artery systolic  pressure. The estimated right ventricular systolic pressure is 42.7 mmHg.   3. Left atrial size was moderately dilated.   4. Right atrial size was mildly dilated.   5. The mitral valve is normal in structure. Trivial mitral valve  regurgitation. No evidence of mitral stenosis.   6. The aortic valve is tricuspid. There is mild calcification of the  aortic valve. Aortic valve regurgitation is not visualized. Aortic valve  sclerosis/calcification is present, without any evidence of aortic  stenosis.   7. The inferior vena cava is dilated in size with <50% respiratory  variability, suggesting right atrial pressure of 15 mmHg.   8. Negative bubble study, no PFO or ASD.   9. The patient was in atrial fibrillation.      Lynwood Geofm [facility] Short Stay Center/Anesthesiology Phone [phone] 03/15/2024 11:41 AM

## 2024-03-16 NOTE — H&P (Signed)
 REFERRING PHYSICIAN: Marvene Prentice Ingle, NP PROVIDER: VICENTA DASIE POLI, MD MRN: [id number] DOB: 05/22/1934   Subjective    Chief Complaint: New Consultation (hernia)  History of Present Illness: Mario Fisher is a 89 y.o. male who is seen  as an office consultation for evaluation of New Consultation (hernia)  This is an 89 year old gentleman referred to me for evaluation of a right inguinal hernia. The patient reports he was lifting a heavy object into his truck in December when he had discomfort noticed a hernia after this. He reports that it will reduce but he has some discomfort and has been wearing some support. He has had no nausea or vomiting. He does have a history of A-fib and is on Xarelto . He currently denies chest pain or shortness of breath.  Review of Systems: A complete review of systems was obtained from the patient. I have reviewed this information and discussed as appropriate with the patient. See HPI as well for other ROS.  ROS   Medical History: Past Medical History:  Diagnosis Date  Arthritis  Chronic kidney disease  COPD (chronic obstructive pulmonary disease) (CMS/HHS-HCC)  GERD (gastroesophageal reflux disease)  Glaucoma (increased eye pressure)  History of cancer  History of stroke  Hyperlipidemia  Hypertension  Sleep apnea   There is no problem list on file for this patient.  Past Surgical History:  Procedure Laterality Date  CATARACT EXTRACTION  CHOLECYSTECTOMY  pace maker    Allergies  Allergen Reactions  Statins-Hmg-Coa Reductase Inhibitors Other (See Comments) and Rash  Joint pain  Upadacitinib  Other (See Comments)  Other reaction(s): ?chills  Etanercept Rash   Current Outpatient Medications on File Prior to Visit  Medication Sig Dispense Refill  allopurinoL  (ZYLOPRIM ) 100 MG tablet Take 200 mg by mouth once daily  cyanocobalamin  (VITAMIN B12) 1,000 mcg/mL injection INJECT 1ML EVERY 6 WEEKS  dorzolamide  (TRUSOPT ) 2 %  ophthalmic solution INSTILL 1 DROPS INTO AFFECTED EYE(S) TWICE DAILY  glucosamine/chondroitin/C/Mang (GLUCOSAMINE 1500 COMPLEX ORAL) Glucosamine Chondr Complex  hydroxychloroquine (PLAQUENIL) 200 mg tablet Take 200 mg by mouth 2 (two) times daily  multivit-min/ferrous fumarate (MULTI VITAMIN ORAL) 1 tablet Orally Once a day for 30 day(s)  omega-3 fatty acids-fish oil 360-1,200 mg Cap Take 1,200-2,400 mg by mouth  propylene glycoL (SYSTANE BALANCE) 0.6 % ophthalmic drops as directed Ophthalmic Twice a day  Saccharomyces boulardii (FLORASTOR) 250 mg capsule Take by mouth  tamsulosin  (FLOMAX ) 0.4 mg capsule Take 0.4 mg by mouth once daily 30 MINUTES AFTER SAME MEAL EACH DAY  timoloL maleate (TIMOPTIC) 0.5 % ophthalmic solution Place 1 drop into both eyes 2 (two) times daily  valsartan  (DIOVAN ) 40 MG tablet Take 40 mg by mouth once daily  valsartan -hydroCHLOROthiazide  (DIOVAN -HCT) 80-12.5 mg tablet Take 1 tablet by mouth once daily  XARELTO  15 mg tablet Take 15 mg by mouth daily with dinner   No current facility-administered medications on file prior to visit.   Family History  Problem Relation Age of Onset  Diabetes Mother  Stroke Father  High blood pressure (Hypertension) Father  Coronary Artery Disease (Blocked arteries around heart) Father  Skin cancer Brother  High blood pressure (Hypertension) Brother    Social History   Tobacco Use  Smoking Status Former  Types: Cigarettes  Smokeless Tobacco Never    Social History   Socioeconomic History  Marital status: Married  Tobacco Use  Smoking status: Former  Types: Cigarettes  Smokeless tobacco: Never  Vaping Use  Vaping status: Never Used  Substance and Sexual Activity  Alcohol  use: Not Currently  Drug use: Never   Social Drivers of Health   Housing Stability: Unknown (01/12/2024)  Housing Stability Vital Sign  Homeless in the Last Year: No   Objective:   Vitals:    BP: 128/82  Pulse: 85  Temp: 36.7 C (98 F)   SpO2: 97%  Weight: 74.9 kg (165 lb 3.2 oz)  Height: 162.6 cm (5' 4)  PainSc: 4  PainLoc: Abdomen   Body mass index is 28.36 kg/m.  Physical Exam   He appears well on exam.  His abdomen is soft. There is a well-healed incision in his right upper quadrant from his previous cholecystectomy  He has an easily reducible, moderate-sized right inguinal hernia. There is no evidence of left inguinal hernia. The right inguinal hernia is nontender  Labs, Imaging and Diagnostic Testing: I have reviewed his notes in the electronic medical records.  Assessment and Plan:   Diagnoses and all orders for this visit:  Right inguinal hernia   I discussed abdominal wall anatomy and hernias with the patient and his wife. Given the size of his hernia and need for chronic anticoagulation, repair of the hernia is recommended. We discussed the risk of strangulation of bowel without hernia repair. I discussed both the laparoscopic and open techniques. I would recommend an open right inguinal hernia repair with mesh with a tap block from anesthesiology and LMA if possible. I explained the reasonings for this with him in detail. I explained the surgical procedure. I discussed the risks which includes but is not limited to bleeding, infection, injury to surrounding structures, use of mesh, hernia recurrence, chronic pain, nerve entrapment, cardiopulmonary issues especially in light of his age, postoperative recovery, etc. He understands and wished to proceed with surgery. We will get cardiac clearance preoperatively and he will need to stop his Xarelto  2 days preoperatively.

## 2024-03-17 ENCOUNTER — Other Ambulatory Visit: Payer: Self-pay

## 2024-03-17 ENCOUNTER — Ambulatory Visit (HOSPITAL_COMMUNITY): Admitting: Anesthesiology

## 2024-03-17 ENCOUNTER — Encounter (HOSPITAL_COMMUNITY): Payer: Self-pay | Admitting: Surgery

## 2024-03-17 ENCOUNTER — Ambulatory Visit (HOSPITAL_COMMUNITY)
Admission: RE | Admit: 2024-03-17 | Discharge: 2024-03-17 | Disposition: A | Discharge status: Home / Self Care | Attending: Surgery | Admitting: Surgery

## 2024-03-17 ENCOUNTER — Ambulatory Visit (HOSPITAL_COMMUNITY): Admitting: Physician Assistant

## 2024-03-17 ENCOUNTER — Encounter (HOSPITAL_COMMUNITY)
Admission: RE | Disposition: A | Discharge status: Home / Self Care | Payer: Self-pay | Source: Home / Self Care | Attending: Surgery

## 2024-03-17 DIAGNOSIS — I129 Hypertensive chronic kidney disease with stage 1 through stage 4 chronic kidney disease, or unspecified chronic kidney disease: Secondary | ICD-10-CM | POA: Diagnosis not present

## 2024-03-17 DIAGNOSIS — Z87891 Personal history of nicotine dependence: Secondary | ICD-10-CM | POA: Insufficient documentation

## 2024-03-17 DIAGNOSIS — I4891 Unspecified atrial fibrillation: Secondary | ICD-10-CM | POA: Insufficient documentation

## 2024-03-17 DIAGNOSIS — X500XXA Overexertion from strenuous movement or load, initial encounter: Secondary | ICD-10-CM | POA: Insufficient documentation

## 2024-03-17 DIAGNOSIS — Z9049 Acquired absence of other specified parts of digestive tract: Secondary | ICD-10-CM | POA: Diagnosis not present

## 2024-03-17 DIAGNOSIS — K4090 Unilateral inguinal hernia, without obstruction or gangrene, not specified as recurrent: Secondary | ICD-10-CM | POA: Diagnosis not present

## 2024-03-17 DIAGNOSIS — N189 Chronic kidney disease, unspecified: Secondary | ICD-10-CM | POA: Insufficient documentation

## 2024-03-17 DIAGNOSIS — G8918 Other acute postprocedural pain: Secondary | ICD-10-CM | POA: Diagnosis not present

## 2024-03-17 DIAGNOSIS — Z7901 Long term (current) use of anticoagulants: Secondary | ICD-10-CM | POA: Diagnosis not present

## 2024-03-17 DIAGNOSIS — G4730 Sleep apnea, unspecified: Secondary | ICD-10-CM | POA: Diagnosis not present

## 2024-03-17 DIAGNOSIS — I10 Essential (primary) hypertension: Secondary | ICD-10-CM | POA: Diagnosis not present

## 2024-03-17 HISTORY — PX: INGUINAL HERNIA REPAIR: SHX194

## 2024-03-17 SURGERY — REPAIR, HERNIA, INGUINAL, ADULT
Anesthesia: General | Laterality: Right

## 2024-03-17 MED ORDER — TRAMADOL HCL 50 MG PO TABS: 50.0000 mg | ORAL_TABLET | Freq: Four times a day (QID) | ORAL | 0 refills | Status: AC | PRN

## 2024-03-17 MED ORDER — DEXAMETHASONE SODIUM PHOSPHATE 10 MG/ML IJ SOLN
INTRAMUSCULAR | Status: DC | PRN
  Administered 2024-03-17: 10 mg via INTRAVENOUS

## 2024-03-17 MED ORDER — LACTATED RINGERS IV SOLN: INTRAVENOUS | Status: DC

## 2024-03-17 MED ORDER — LIDOCAINE 2% (20 MG/ML) 5 ML SYRINGE
INTRAMUSCULAR | Status: DC | PRN
  Administered 2024-03-17: 40 mg via INTRAVENOUS

## 2024-03-17 MED ORDER — ONDANSETRON HCL 4 MG/2ML IJ SOLN
INTRAMUSCULAR | Status: DC | PRN
  Administered 2024-03-17: 4 mg via INTRAVENOUS

## 2024-03-17 MED ORDER — DEXAMETHASONE SODIUM PHOSPHATE 10 MG/ML IJ SOLN
INTRAMUSCULAR | Status: AC
  Filled 2024-03-17: qty 1

## 2024-03-17 MED ORDER — ENSURE PRE-SURGERY PO LIQD: 296.0000 mL | Freq: Once | ORAL | Status: DC

## 2024-03-17 MED ORDER — CHLORHEXIDINE GLUCONATE 0.12 % MT SOLN
15.0000 mL | Freq: Once | OROMUCOSAL | Status: AC
  Administered 2024-03-17: 15 mL via OROMUCOSAL
  Filled 2024-03-17: qty 15

## 2024-03-17 MED ORDER — BUPIVACAINE-EPINEPHRINE 0.5% -1:200000 IJ SOLN
INTRAMUSCULAR | Status: DC | PRN
Start: 2024-03-17 — End: 2024-03-17
  Administered 2024-03-17: 10 mL

## 2024-03-17 MED ORDER — FENTANYL CITRATE (PF) 250 MCG/5ML IJ SOLN
INTRAMUSCULAR | Status: DC | PRN
  Administered 2024-03-17: 25 ug via INTRAVENOUS

## 2024-03-17 MED ORDER — BUPIVACAINE-EPINEPHRINE (PF) 0.5% -1:200000 IJ SOLN
INTRAMUSCULAR | Status: DC | PRN
  Administered 2024-03-17: 30 mL via PERINEURAL

## 2024-03-17 MED ORDER — BUPIVACAINE-EPINEPHRINE (PF) 0.5% -1:200000 IJ SOLN
INTRAMUSCULAR | Status: AC
  Filled 2024-03-17: qty 30

## 2024-03-17 MED ORDER — PROPOFOL 10 MG/ML IV BOLUS
INTRAVENOUS | Status: AC
  Filled 2024-03-17: qty 20

## 2024-03-17 MED ORDER — ACETAMINOPHEN 500 MG PO TABS
1000.0000 mg | ORAL_TABLET | ORAL | Status: AC
  Administered 2024-03-17: 1000 mg via ORAL
  Filled 2024-03-17: qty 2

## 2024-03-17 MED ORDER — LIDOCAINE 2% (20 MG/ML) 5 ML SYRINGE
INTRAMUSCULAR | Status: AC
  Filled 2024-03-17: qty 5

## 2024-03-17 MED ORDER — PHENYLEPHRINE 80 MCG/ML (10ML) SYRINGE FOR IV PUSH (FOR BLOOD PRESSURE SUPPORT)
PREFILLED_SYRINGE | INTRAVENOUS | Status: DC | PRN
  Administered 2024-03-17 (×3): 80 ug via INTRAVENOUS

## 2024-03-17 MED ORDER — ORAL CARE MOUTH RINSE: 15.0000 mL | Freq: Once | OROMUCOSAL | Status: AC

## 2024-03-17 MED ORDER — PROPOFOL 10 MG/ML IV BOLUS
INTRAVENOUS | Status: DC | PRN
  Administered 2024-03-17: 50 mg via INTRAVENOUS
  Administered 2024-03-17: 150 mg via INTRAVENOUS

## 2024-03-17 MED ORDER — 0.9 % SODIUM CHLORIDE (POUR BTL) OPTIME
TOPICAL | Status: DC | PRN
Start: 2024-03-17 — End: 2024-03-17
  Administered 2024-03-17: 1000 mL

## 2024-03-17 MED ORDER — PHENYLEPHRINE HCL-NACL 20-0.9 MG/250ML-% IV SOLN
INTRAVENOUS | Status: AC
  Filled 2024-03-17: qty 750

## 2024-03-17 MED ORDER — CEFAZOLIN SODIUM-DEXTROSE 2-4 GM/100ML-% IV SOLN
2.0000 g | INTRAVENOUS | Status: AC
  Administered 2024-03-17: 2 g via INTRAVENOUS
  Filled 2024-03-17: qty 100

## 2024-03-17 MED ORDER — FENTANYL CITRATE (PF) 100 MCG/2ML IJ SOLN: 25.0000 ug | INTRAMUSCULAR | Status: DC | PRN

## 2024-03-17 MED ORDER — BUPIVACAINE-EPINEPHRINE (PF) 0.25% -1:200000 IJ SOLN
INTRAMUSCULAR | Status: AC
  Filled 2024-03-17: qty 30

## 2024-03-17 MED ORDER — ONDANSETRON HCL 4 MG/2ML IJ SOLN
INTRAMUSCULAR | Status: AC
  Filled 2024-03-17: qty 2

## 2024-03-17 MED ORDER — CHLORHEXIDINE GLUCONATE CLOTH 2 % EX PADS: 6.0000 | MEDICATED_PAD | Freq: Once | CUTANEOUS | Status: DC

## 2024-03-17 MED ORDER — OXYCODONE HCL 5 MG/5ML PO SOLN: 5.0000 mg | Freq: Once | ORAL | Status: DC | PRN

## 2024-03-17 MED ORDER — PHENYLEPHRINE 80 MCG/ML (10ML) SYRINGE FOR IV PUSH (FOR BLOOD PRESSURE SUPPORT)
PREFILLED_SYRINGE | INTRAVENOUS | Status: AC
  Filled 2024-03-17: qty 10

## 2024-03-17 MED ORDER — ONDANSETRON HCL 4 MG/2ML IJ SOLN: 4.0000 mg | Freq: Four times a day (QID) | INTRAMUSCULAR | Status: DC | PRN

## 2024-03-17 MED ORDER — FENTANYL CITRATE (PF) 250 MCG/5ML IJ SOLN
INTRAMUSCULAR | Status: AC
Start: 2024-03-17 — End: 2024-03-17
  Filled 2024-03-17: qty 5

## 2024-03-17 MED ORDER — OXYCODONE HCL 5 MG PO TABS: 5.0000 mg | ORAL_TABLET | Freq: Once | ORAL | Status: DC | PRN

## 2024-03-17 SURGICAL SUPPLY — 27 items
BAG COUNTER SPONGE SURGICOUNT (BAG) IMPLANT
BLADE CLIPPER SURG (BLADE) IMPLANT
CHLORAPREP W/TINT 26 (MISCELLANEOUS) ×1 IMPLANT
COVER SURGICAL LIGHT HANDLE (MISCELLANEOUS) ×1 IMPLANT
DERMABOND ADVANCED .7 DNX12 (GAUZE/BANDAGES/DRESSINGS) ×1 IMPLANT
DRAIN PENROSE .5X12 LATEX STL (DRAIN) IMPLANT
DRAPE LAPAROTOMY TRNSV 102X78 (DRAPES) ×1 IMPLANT
ELECTRODE REM PT RTRN 9FT ADLT (ELECTROSURGICAL) ×1 IMPLANT
GLOVE SURG SIGNA 7.5 PF LTX (GLOVE) ×1 IMPLANT
GOWN STRL REUS W/ TWL LRG LVL3 (GOWN DISPOSABLE) ×1 IMPLANT
GOWN STRL REUS W/ TWL XL LVL3 (GOWN DISPOSABLE) ×1 IMPLANT
KIT BASIN OR (CUSTOM PROCEDURE TRAY) ×1 IMPLANT
KIT TURNOVER KIT B (KITS) ×1 IMPLANT
MESH PARIETEX PROGRIP RIGHT (Mesh General) IMPLANT
NDL HYPO 25GX1X1/2 BEV (NEEDLE) ×1 IMPLANT
NEEDLE HYPO 25GX1X1/2 BEV (NEEDLE) ×1 IMPLANT
NS IRRIG 1000ML POUR BTL (IV SOLUTION) ×1 IMPLANT
PACK GENERAL/GYN (CUSTOM PROCEDURE TRAY) ×1 IMPLANT
PAD ARMBOARD POSITIONER FOAM (MISCELLANEOUS) ×1 IMPLANT
PENCIL SMOKE EVACUATOR (MISCELLANEOUS) ×1 IMPLANT
SUT MON AB 4-0 PC3 18 (SUTURE) ×1 IMPLANT
SUT SILK 2 0 SH (SUTURE) IMPLANT
SUT VIC AB 2-0 CT1 TAPERPNT 27 (SUTURE) ×1 IMPLANT
SUT VIC AB 3-0 CT1 TAPERPNT 27 (SUTURE) ×1 IMPLANT
SYR CONTROL 10ML LL (SYRINGE) ×1 IMPLANT
TOWEL GREEN STERILE (TOWEL DISPOSABLE) ×1 IMPLANT
TOWEL GREEN STERILE FF (TOWEL DISPOSABLE) ×1 IMPLANT

## 2024-03-17 NOTE — Interval H&P Note (Signed)
 History and Physical Interval Note: No change in H and P  03/17/2024 7:04 AM  Mario Fisher  has presented today for surgery, with the diagnosis of RIGHT INGUINAL HERNIA.  The various methods of treatment have been discussed with the patient and family. After consideration of risks, benefits and other options for treatment, the patient has consented to  Procedure(s) with comments: REPAIR, HERNIA, INGUINAL, ADULT (Right) - OPEN RIGHT INGUINAL HERNIA REPAIR WITH MESH as a surgical intervention.  The patient's history has been reviewed, patient examined, no change in status, stable for surgery.  I have reviewed the patient's chart and labs.  Questions were answered to the patient's satisfaction.     Vicenta Poli

## 2024-03-17 NOTE — Anesthesia Procedure Notes (Signed)
 Anesthesia Regional Block: TAP block   Pre-Anesthetic Checklist: , timeout performed,  Correct Patient, Correct Site, Correct Laterality,  Correct Procedure, Correct Position, site marked,  Risks and benefits discussed,  Surgical consent,  Pre-op evaluation,  At surgeon's request and post-op pain management  Laterality: Right  Prep: chloraprep       Needles:  Injection technique: Single-shot  Needle Type: Echogenic Needle     Needle Length: 9cm  Needle Gauge: 21     Additional Needles:   Narrative:  Start time: 03/17/2024 7:10 AM End time: 03/17/2024 7:18 AM Injection made incrementally with aspirations every 5 mL.  Performed by: Personally  Anesthesiologist: Maryclare Cornet, MD  Additional Notes: Pt tolerated the procedure well.

## 2024-03-17 NOTE — Transfer of Care (Signed)
 Immediate Anesthesia Transfer of Care Note  Patient: Mario Fisher  Procedure(s) Performed: REPAIR, HERNIA, INGUINAL, ADULT (Right)  Patient Location: PACU  Anesthesia Type:GA combined with regional for post-op pain  Level of Consciousness: awake and drowsy  Airway & Oxygen Therapy: Patient Spontanous Breathing and Patient connected to face mask  Post-op Assessment: Report given to RN and Post -op Vital signs reviewed and stable  Post vital signs: Reviewed and stable  Last Vitals:  Vitals Value Taken Time  BP 154/90 03/17/24 08:32  Temp    Pulse 63 03/17/24 08:35  Resp 14 03/17/24 08:35  SpO2 100 % 03/17/24 08:35  Vitals shown include unfiled device data.  Last Pain:  Vitals:   03/17/24 0622  TempSrc:   PainSc: 5       Patients Stated Pain Goal: 2 (03/17/24 9385)  Complications: No notable events documented.

## 2024-03-17 NOTE — Discharge Instructions (Signed)
 [facility]  UMBILICAL OR INGUINAL HERNIA REPAIR: POST OP INSTRUCTIONS  Always review your discharge instruction sheet given to you by the facility where your surgery was performed. IF YOU HAVE DISABILITY OR FAMILY LEAVE FORMS, YOU MUST BRING THEM TO THE OFFICE FOR PROCESSING.   DO NOT GIVE THEM TO YOUR DOCTOR.  1. A  prescription for pain medication may be given to you upon discharge.  Take your pain medication as prescribed, if needed.  If narcotic pain medicine is not needed, then you may take acetaminophen  (Tylenol ) or ibuprofen (Advil) as needed. 2. Take your usually prescribed medications unless otherwise directed. If you need a refill on your pain medication, please contact your pharmacy.  They will contact our office to request authorization. Prescriptions will not be filled after 5 pm or on week-ends. 3. You should follow a light diet the first 24 hours after arrival home, such as soup and crackers, etc.  Be sure to include lots of fluids daily.  Resume your normal diet the day after surgery. 4.Most patients will experience some swelling and bruising around the umbilicus or in the groin and scrotum.  Ice packs and reclining will help.  Swelling and bruising can take several days to resolve.  6. It is common to experience some constipation if taking pain medication after surgery.  Increasing fluid intake and taking a stool softener (such as Colace) will usually help or prevent this problem from occurring.  A mild laxative (Milk of Magnesia or Miralax ) should be taken according to package directions if there are no bowel movements after 48 hours. 7. Unless discharge instructions indicate otherwise, you may remove your bandages 24-48 hours after surgery, and you may shower at that time.  You may have steri-strips (small skin tapes) in place directly over the incision.  These strips should be left on the skin for 7-10 days.  If your surgeon used skin glue on the  incision, you may shower in 24 hours.  The glue will flake off over the next 2-3 weeks.  Any sutures or staples will be removed at the office during your follow-up visit. 8. ACTIVITIES:  You may resume regular (light) daily activities beginning the next day--such as daily self-care, walking, climbing stairs--gradually increasing activities as tolerated.  You may have sexual intercourse when it is comfortable.  Refrain from any heavy lifting or straining until approved by your doctor.  a.You may drive when you are no longer taking prescription pain medication, you can comfortably wear a seatbelt, and you can safely maneuver your car and apply brakes. b.RETURN TO WORK:   _____________________________________________  9.You should see your doctor in the office for a follow-up appointment approximately 2-3 weeks after your surgery.  Make sure that you call for this appointment within a day or two after you arrive home to insure a convenient appointment time. 10.OTHER INSTRUCTIONS: NO LIFTING MORE THAN 15 POUNDS FOR 4 WEEKS YOU MAY SHOWER STARTING MORE TOMORROW ICE PACK AND TYLENOL  ALSO FOR PAIN     _____________________________________  WHEN TO CALL YOUR DOCTOR: Fever over 101.0 Inability to urinate Nausea and/or vomiting Extreme swelling or bruising Continued bleeding from incision. Increased pain, redness, or drainage from the incision  The clinic staff is available to answer your questions during regular business hours.  Please don't hesitate to call and ask to speak to one of the nurses for clinical concerns.  If you have a medical emergency, go to the nearest emergency room or call 911.  A surgeon from [facility] is always on call at the hospital   [address], [address], [place], [place]  [address] ?  [address] [address], [place], [place]   [address] [phone] ? [phone] ? FAX [phone] Web site: [email or web address]

## 2024-03-17 NOTE — Anesthesia Procedure Notes (Addendum)
 Procedure Name: LMA Insertion Date/Time: 03/17/2024 7:41 AM  Performed by: Jolynn Mage, CRNAPre-anesthesia Checklist: Patient identified, Emergency Drugs available, Suction available and Patient being monitored Patient Re-evaluated:Patient Re-evaluated prior to induction Oxygen Delivery Method: Circle system utilized Preoxygenation: Pre-oxygenation with 100% oxygen Induction Type: IV induction LMA: LMA flexible inserted LMA Size: 4.0 Number of attempts: 1 Placement Confirmation: positive ETCO2 and breath sounds checked- equal and bilateral Tube secured with: Tape Dental Injury: Teeth and Oropharynx as per pre-operative assessment

## 2024-03-17 NOTE — Op Note (Signed)
   Mario Fisher 03/17/2024   Pre-op Diagnosis: RIGHT INGUINAL HERNIA     Post-op Diagnosis: same  Procedure(s): OPEN RIGHT INGUINAL HERNIA REPAIR WITH MESH  Surgeon(s): Vernetta Berg, MD  Anesthesia: General  Staff:  Circulator: [place], Karrie S, RN Relief Scrub: Cass Blane JAYSON, RN Scrub Person: Myrna Byers C, RN  Estimated Blood Loss: Minimal               Findings: large right indirect inguinal hernia with weak right inguinal floor.  Procedure: The patient is brought to the operating room and identified as a correct patient.  He is placed upon the operating table general anesthesia was induced.  He had received a preoperative right-sided tap block by anesthesiology in the preoperative holding area.  I injected the skin in the right inguinal area with Marcaine  and made a longitudinal incision with a scalpel.  I then dissected down through Scarpa's fascia with the cautery.  I then opened the external oblique fascia toward the internal/external ring.  The patient's testicular cord structures were controlled with a Penrose drain.  He had a large indirect right inguinal hernia sac.  All contents that are been reduced.  I was able to dissect the sac down to the base and then tied off with a 2-0 silk suture and excised the redundant sac.  He also had a fairly weak inguinal floor.  Next a large piece of Prolene ProGrip mesh was brought to the field.  I placed that against the pubic tubercle and then brought around the cord structures and internal ring and back to the tubercle.  I then sutured the mesh in place with several 2-0 Vicryl sutures to the pubic tubercle.  Wide coverage of the inguinal floor and internal ring appear to be achieved.  I then closed the external oblique fascia over the top of the mesh with a running 2-0 Vicryl suture.  Scarpa's fascia was then closed with interrupted 3-0 Vicryl sutures and the skin was closed with running 4-0 Monocryl.  Dermabond was then  applied.  The patient tolerated the procedure well.  All the counts were correct at the end of the procedure.  The patient was then extubated in the operating room and taken in a stable condition to the recovery room.          Berg Vernetta   Date: 03/17/2024  Time: 8:26 AM

## 2024-03-18 ENCOUNTER — Encounter (HOSPITAL_COMMUNITY): Payer: Self-pay | Admitting: Surgery

## 2024-03-18 NOTE — Anesthesia Postprocedure Evaluation (Signed)
 Anesthesia Post Note  Patient: MONTRAY KLIEBERT  Procedure(s) Performed: REPAIR, HERNIA, INGUINAL, ADULT (Right)     Patient location during evaluation: PACU Anesthesia Type: General and Regional Level of consciousness: awake and alert Pain management: pain level controlled Vital Signs Assessment: post-procedure vital signs reviewed and stable Respiratory status: spontaneous breathing, nonlabored ventilation, respiratory function stable and patient connected to nasal cannula oxygen Cardiovascular status: blood pressure returned to baseline and stable Postop Assessment: no apparent nausea or vomiting Anesthetic complications: no   No notable events documented.  Last Vitals:  Vitals:   03/17/24 0900 03/17/24 0911  BP: 124/78 (!) 147/82  Pulse: 60 60  Resp: 17 13  Temp:  36.4 C  SpO2: 98% 98%    Last Pain:  Vitals:   03/17/24 0911  TempSrc:   PainSc: 0-No pain                 Kyley Solow S

## 2024-03-24 NOTE — Progress Notes (Signed)
 Remote pacemaker transmission.

## 2024-04-12 ENCOUNTER — Other Ambulatory Visit: Payer: Self-pay

## 2024-04-12 MED ORDER — HYDROCHLOROTHIAZIDE 12.5 MG PO CAPS: 12.5000 mg | ORAL_CAPSULE | Freq: Every day | ORAL | 1 refills | Status: AC

## 2024-05-16 ENCOUNTER — Ambulatory Visit (INDEPENDENT_AMBULATORY_CARE_PROVIDER_SITE_OTHER): Payer: Medicare Other

## 2024-05-16 DIAGNOSIS — I4819 Other persistent atrial fibrillation: Secondary | ICD-10-CM | POA: Diagnosis not present

## 2024-05-17 LAB — CUP PACEART REMOTE DEVICE CHECK
Battery Remaining Longevity: 45 mo
Battery Voltage: 2.93 V
Brady Statistic RA Percent Paced: 0 %
Brady Statistic RV Percent Paced: 99.99 %
Date Time Interrogation Session: 20250916014437
Implantable Lead Connection Status: 753985
Implantable Lead Connection Status: 753985
Implantable Lead Implant Date: 20180808
Implantable Lead Implant Date: 20180808
Implantable Lead Location: 753859
Implantable Lead Location: 753860
Implantable Lead Model: 5076
Implantable Lead Model: 5076
Implantable Pulse Generator Implant Date: 20180808
Lead Channel Impedance Value: 266 Ohm
Lead Channel Impedance Value: 323 Ohm
Lead Channel Impedance Value: 361 Ohm
Lead Channel Impedance Value: 456 Ohm
Lead Channel Pacing Threshold Amplitude: 1 V
Lead Channel Pacing Threshold Amplitude: 1 V
Lead Channel Pacing Threshold Pulse Width: 0.4 ms
Lead Channel Pacing Threshold Pulse Width: 0.4 ms
Lead Channel Sensing Intrinsic Amplitude: 1.125 mV
Lead Channel Sensing Intrinsic Amplitude: 1.125 mV
Lead Channel Sensing Intrinsic Amplitude: 9.375 mV
Lead Channel Sensing Intrinsic Amplitude: 9.375 mV
Lead Channel Setting Pacing Amplitude: 2 V
Lead Channel Setting Pacing Pulse Width: 0.4 ms
Lead Channel Setting Sensing Sensitivity: 0.9 mV
Zone Setting Status: 755011
Zone Setting Status: 755011

## 2024-05-18 DIAGNOSIS — M459 Ankylosing spondylitis of unspecified sites in spine: Secondary | ICD-10-CM | POA: Diagnosis not present

## 2024-05-18 DIAGNOSIS — Z79899 Other long term (current) drug therapy: Secondary | ICD-10-CM | POA: Diagnosis not present

## 2024-05-18 DIAGNOSIS — M0600 Rheumatoid arthritis without rheumatoid factor, unspecified site: Secondary | ICD-10-CM | POA: Diagnosis not present

## 2024-05-18 DIAGNOSIS — R945 Abnormal results of liver function studies: Secondary | ICD-10-CM | POA: Diagnosis not present

## 2024-05-18 DIAGNOSIS — M1009 Idiopathic gout, multiple sites: Secondary | ICD-10-CM | POA: Diagnosis not present

## 2024-05-18 DIAGNOSIS — M1991 Primary osteoarthritis, unspecified site: Secondary | ICD-10-CM | POA: Diagnosis not present

## 2024-05-19 ENCOUNTER — Ambulatory Visit: Payer: Self-pay | Admitting: Cardiology

## 2024-05-23 NOTE — Progress Notes (Signed)
 Remote PPM Transmission

## 2024-06-01 DIAGNOSIS — Z23 Encounter for immunization: Secondary | ICD-10-CM | POA: Diagnosis not present

## 2024-06-01 DIAGNOSIS — Z0000 Encounter for general adult medical examination without abnormal findings: Secondary | ICD-10-CM | POA: Diagnosis not present

## 2024-06-16 DIAGNOSIS — Z129 Encounter for screening for malignant neoplasm, site unspecified: Secondary | ICD-10-CM | POA: Diagnosis not present

## 2024-06-16 DIAGNOSIS — Z86008 Personal history of in-situ neoplasm of other site: Secondary | ICD-10-CM | POA: Diagnosis not present

## 2024-06-16 DIAGNOSIS — L820 Inflamed seborrheic keratosis: Secondary | ICD-10-CM | POA: Diagnosis not present

## 2024-06-16 DIAGNOSIS — L570 Actinic keratosis: Secondary | ICD-10-CM | POA: Diagnosis not present

## 2024-06-16 DIAGNOSIS — L821 Other seborrheic keratosis: Secondary | ICD-10-CM | POA: Diagnosis not present

## 2024-06-23 DIAGNOSIS — H401132 Primary open-angle glaucoma, bilateral, moderate stage: Secondary | ICD-10-CM | POA: Diagnosis not present

## 2024-06-23 DIAGNOSIS — H04123 Dry eye syndrome of bilateral lacrimal glands: Secondary | ICD-10-CM | POA: Diagnosis not present

## 2024-06-30 DIAGNOSIS — G4733 Obstructive sleep apnea (adult) (pediatric): Secondary | ICD-10-CM | POA: Diagnosis not present

## 2024-08-15 ENCOUNTER — Ambulatory Visit: Payer: Medicare Other

## 2024-08-15 DIAGNOSIS — I4819 Other persistent atrial fibrillation: Secondary | ICD-10-CM

## 2024-08-17 LAB — CUP PACEART REMOTE DEVICE CHECK
Battery Remaining Longevity: 46 mo
Battery Voltage: 2.93 V
Brady Statistic RA Percent Paced: 0 %
Brady Statistic RV Percent Paced: 99.96 %
Date Time Interrogation Session: 20251215005425
Implantable Lead Connection Status: 753985
Implantable Lead Connection Status: 753985
Implantable Lead Implant Date: 20180808
Implantable Lead Implant Date: 20180808
Implantable Lead Location: 753859
Implantable Lead Location: 753860
Implantable Lead Model: 5076
Implantable Lead Model: 5076
Implantable Pulse Generator Implant Date: 20180808
Lead Channel Impedance Value: 285 Ohm
Lead Channel Impedance Value: 323 Ohm
Lead Channel Impedance Value: 380 Ohm
Lead Channel Impedance Value: 475 Ohm
Lead Channel Pacing Threshold Amplitude: 1 V
Lead Channel Pacing Threshold Amplitude: 1 V
Lead Channel Pacing Threshold Pulse Width: 0.4 ms
Lead Channel Pacing Threshold Pulse Width: 0.4 ms
Lead Channel Sensing Intrinsic Amplitude: 0.875 mV
Lead Channel Sensing Intrinsic Amplitude: 0.875 mV
Lead Channel Sensing Intrinsic Amplitude: 9.75 mV
Lead Channel Sensing Intrinsic Amplitude: 9.75 mV
Lead Channel Setting Pacing Amplitude: 2 V
Lead Channel Setting Pacing Pulse Width: 0.4 ms
Lead Channel Setting Sensing Sensitivity: 0.9 mV
Zone Setting Status: 755011
Zone Setting Status: 755011

## 2024-08-19 NOTE — Progress Notes (Signed)
 Remote PPM Transmission

## 2024-08-31 ENCOUNTER — Ambulatory Visit: Payer: Self-pay | Admitting: Cardiovascular Disease

## 2024-09-08 ENCOUNTER — Encounter: Payer: Self-pay | Admitting: Cardiovascular Disease
